# Patient Record
Sex: Female | Born: 1960 | Race: White | Hispanic: No | Smoking: Former smoker
Health system: Southern US, Community
[De-identification: ages and names within clinical notes are randomized; demographics above are authoritative.]

## PROBLEM LIST (undated history)

## (undated) DIAGNOSIS — F419 Anxiety disorder, unspecified: Secondary | ICD-10-CM

## (undated) DIAGNOSIS — G629 Polyneuropathy, unspecified: Secondary | ICD-10-CM

## (undated) DIAGNOSIS — K5190 Ulcerative colitis, unspecified, without complications: Secondary | ICD-10-CM

## (undated) DIAGNOSIS — K469 Unspecified abdominal hernia without obstruction or gangrene: Secondary | ICD-10-CM

## (undated) DIAGNOSIS — R112 Nausea with vomiting, unspecified: Secondary | ICD-10-CM

## (undated) DIAGNOSIS — R110 Nausea: Secondary | ICD-10-CM

## (undated) DIAGNOSIS — D649 Anemia, unspecified: Secondary | ICD-10-CM

## (undated) DIAGNOSIS — K219 Gastro-esophageal reflux disease without esophagitis: Secondary | ICD-10-CM

## (undated) DIAGNOSIS — T8859XA Other complications of anesthesia, initial encounter: Secondary | ICD-10-CM

## (undated) DIAGNOSIS — N809 Endometriosis, unspecified: Secondary | ICD-10-CM

## (undated) DIAGNOSIS — R52 Pain, unspecified: Secondary | ICD-10-CM

## (undated) DIAGNOSIS — K259 Gastric ulcer, unspecified as acute or chronic, without hemorrhage or perforation: Secondary | ICD-10-CM

## (undated) DIAGNOSIS — M5020 Other cervical disc displacement, unspecified cervical region: Secondary | ICD-10-CM

## (undated) DIAGNOSIS — F329 Major depressive disorder, single episode, unspecified: Secondary | ICD-10-CM

## (undated) DIAGNOSIS — K56609 Unspecified intestinal obstruction, unspecified as to partial versus complete obstruction: Secondary | ICD-10-CM

## (undated) DIAGNOSIS — J40 Bronchitis, not specified as acute or chronic: Secondary | ICD-10-CM

## (undated) DIAGNOSIS — Z9889 Other specified postprocedural states: Secondary | ICD-10-CM

## (undated) DIAGNOSIS — T4145XA Adverse effect of unspecified anesthetic, initial encounter: Secondary | ICD-10-CM

## (undated) DIAGNOSIS — M1990 Unspecified osteoarthritis, unspecified site: Secondary | ICD-10-CM

## (undated) DIAGNOSIS — G43909 Migraine, unspecified, not intractable, without status migrainosus: Secondary | ICD-10-CM

## (undated) DIAGNOSIS — J45909 Unspecified asthma, uncomplicated: Secondary | ICD-10-CM

## (undated) DIAGNOSIS — M797 Fibromyalgia: Secondary | ICD-10-CM

## (undated) DIAGNOSIS — E785 Hyperlipidemia, unspecified: Secondary | ICD-10-CM

## (undated) DIAGNOSIS — E119 Type 2 diabetes mellitus without complications: Secondary | ICD-10-CM

## (undated) DIAGNOSIS — K5090 Crohn's disease, unspecified, without complications: Secondary | ICD-10-CM

## (undated) DIAGNOSIS — F32A Depression, unspecified: Secondary | ICD-10-CM

## (undated) HISTORY — DX: Fibromyalgia: M79.7

## (undated) HISTORY — DX: Migraine, unspecified, not intractable, without status migrainosus: G43.909

## (undated) HISTORY — PX: CHOLECYSTECTOMY: SHX55

## (undated) HISTORY — DX: Ulcerative colitis, unspecified, without complications: K51.90

## (undated) HISTORY — PX: OTHER SURGICAL HISTORY: SHX169

## (undated) HISTORY — PX: ABDOMINAL HYSTERECTOMY: SHX81

## (undated) HISTORY — PX: ILEOSTOMY: SHX1783

## (undated) HISTORY — DX: Major depressive disorder, single episode, unspecified: F32.9

## (undated) HISTORY — DX: Hyperlipidemia, unspecified: E78.5

## (undated) HISTORY — DX: Type 2 diabetes mellitus without complications: E11.9

## (undated) HISTORY — DX: Depression, unspecified: F32.A

## (undated) HISTORY — DX: Anxiety disorder, unspecified: F41.9

## (undated) HISTORY — DX: Gastro-esophageal reflux disease without esophagitis: K21.9

## (undated) HISTORY — PX: HERNIA REPAIR: SHX51

## (undated) HISTORY — DX: Anemia, unspecified: D64.9

## (undated) HISTORY — PX: COLON RESECTION: SHX5231

## (undated) HISTORY — DX: Unspecified osteoarthritis, unspecified site: M19.90

## (undated) HISTORY — DX: Unspecified asthma, uncomplicated: J45.909

---

## 2004-07-16 ENCOUNTER — Ambulatory Visit: Payer: Self-pay

## 2004-12-02 ENCOUNTER — Emergency Department: Payer: Self-pay | Admitting: Internal Medicine

## 2004-12-03 ENCOUNTER — Inpatient Hospital Stay: Payer: Self-pay | Admitting: Surgery

## 2004-12-26 ENCOUNTER — Ambulatory Visit: Payer: Self-pay | Admitting: Emergency Medicine

## 2005-01-19 ENCOUNTER — Ambulatory Visit: Payer: Self-pay | Admitting: Emergency Medicine

## 2005-05-15 ENCOUNTER — Inpatient Hospital Stay: Payer: Self-pay | Admitting: Surgery

## 2005-05-16 ENCOUNTER — Other Ambulatory Visit: Payer: Self-pay

## 2005-06-01 ENCOUNTER — Emergency Department: Payer: Self-pay | Admitting: Internal Medicine

## 2005-09-01 ENCOUNTER — Ambulatory Visit: Payer: Self-pay

## 2006-07-16 ENCOUNTER — Ambulatory Visit: Payer: Self-pay

## 2006-09-30 ENCOUNTER — Ambulatory Visit: Payer: Self-pay

## 2006-10-08 ENCOUNTER — Ambulatory Visit: Payer: Self-pay | Admitting: Rheumatology

## 2006-10-20 ENCOUNTER — Ambulatory Visit: Payer: Self-pay | Admitting: Internal Medicine

## 2006-11-06 ENCOUNTER — Ambulatory Visit: Payer: Self-pay | Admitting: Internal Medicine

## 2006-11-20 ENCOUNTER — Ambulatory Visit: Payer: Self-pay | Admitting: Internal Medicine

## 2006-12-21 ENCOUNTER — Ambulatory Visit: Payer: Self-pay | Admitting: Internal Medicine

## 2007-01-20 ENCOUNTER — Ambulatory Visit: Payer: Self-pay | Admitting: Internal Medicine

## 2007-04-01 ENCOUNTER — Ambulatory Visit: Payer: Self-pay | Admitting: Specialist

## 2007-04-02 ENCOUNTER — Ambulatory Visit: Payer: Self-pay | Admitting: Surgery

## 2007-04-28 ENCOUNTER — Emergency Department: Payer: Self-pay

## 2007-06-29 ENCOUNTER — Ambulatory Visit: Payer: Self-pay

## 2007-07-15 ENCOUNTER — Ambulatory Visit: Payer: Self-pay | Admitting: Pain Medicine

## 2007-10-29 ENCOUNTER — Ambulatory Visit: Payer: Self-pay | Admitting: Family Medicine

## 2008-03-01 ENCOUNTER — Emergency Department: Payer: Self-pay | Admitting: Emergency Medicine

## 2008-03-21 ENCOUNTER — Ambulatory Visit: Payer: Self-pay | Admitting: Surgery

## 2008-05-08 ENCOUNTER — Ambulatory Visit (HOSPITAL_COMMUNITY): Admission: RE | Admit: 2008-05-08 | Discharge: 2008-05-08 | Payer: Self-pay | Admitting: Gastroenterology

## 2008-06-06 ENCOUNTER — Emergency Department: Payer: Self-pay | Admitting: Emergency Medicine

## 2008-07-14 ENCOUNTER — Emergency Department: Payer: Self-pay | Admitting: Emergency Medicine

## 2009-01-02 ENCOUNTER — Ambulatory Visit: Payer: Self-pay | Admitting: Family Medicine

## 2009-09-06 ENCOUNTER — Ambulatory Visit: Payer: Self-pay | Admitting: Family Medicine

## 2009-09-28 ENCOUNTER — Ambulatory Visit: Payer: Self-pay | Admitting: Family Medicine

## 2010-02-27 ENCOUNTER — Other Ambulatory Visit: Payer: Self-pay | Admitting: Physician Assistant

## 2010-03-11 ENCOUNTER — Ambulatory Visit: Payer: Self-pay | Admitting: Gastroenterology

## 2010-03-12 ENCOUNTER — Ambulatory Visit: Payer: Self-pay | Admitting: Gastroenterology

## 2010-03-18 ENCOUNTER — Ambulatory Visit: Payer: Self-pay | Admitting: Gastroenterology

## 2010-08-15 ENCOUNTER — Emergency Department: Payer: Self-pay | Admitting: Emergency Medicine

## 2010-09-10 ENCOUNTER — Ambulatory Visit: Payer: Self-pay | Admitting: Emergency Medicine

## 2010-09-16 LAB — PATHOLOGY REPORT

## 2010-11-07 ENCOUNTER — Ambulatory Visit: Payer: Self-pay | Admitting: Rheumatology

## 2010-11-20 ENCOUNTER — Ambulatory Visit: Payer: Self-pay | Admitting: Rheumatology

## 2011-02-18 ENCOUNTER — Ambulatory Visit: Payer: Self-pay | Admitting: Gastroenterology

## 2011-04-30 ENCOUNTER — Ambulatory Visit: Payer: Self-pay | Admitting: Family Medicine

## 2011-05-11 ENCOUNTER — Emergency Department: Payer: Self-pay | Admitting: Emergency Medicine

## 2011-08-11 ENCOUNTER — Ambulatory Visit: Payer: Self-pay | Admitting: Rheumatology

## 2011-12-17 ENCOUNTER — Ambulatory Visit: Payer: Self-pay | Admitting: Urology

## 2012-02-25 ENCOUNTER — Other Ambulatory Visit: Payer: Self-pay | Admitting: Physician Assistant

## 2012-05-03 DIAGNOSIS — N3020 Other chronic cystitis without hematuria: Secondary | ICD-10-CM | POA: Insufficient documentation

## 2012-05-03 DIAGNOSIS — N312 Flaccid neuropathic bladder, not elsewhere classified: Secondary | ICD-10-CM | POA: Insufficient documentation

## 2012-05-03 DIAGNOSIS — N9489 Other specified conditions associated with female genital organs and menstrual cycle: Secondary | ICD-10-CM | POA: Insufficient documentation

## 2012-06-28 ENCOUNTER — Ambulatory Visit: Payer: Self-pay | Admitting: Family Medicine

## 2012-08-17 DIAGNOSIS — R197 Diarrhea, unspecified: Secondary | ICD-10-CM | POA: Insufficient documentation

## 2012-08-22 ENCOUNTER — Emergency Department: Payer: Self-pay | Admitting: Emergency Medicine

## 2012-09-05 ENCOUNTER — Emergency Department: Payer: Self-pay | Admitting: Emergency Medicine

## 2012-09-24 DIAGNOSIS — J449 Chronic obstructive pulmonary disease, unspecified: Secondary | ICD-10-CM | POA: Insufficient documentation

## 2012-09-24 DIAGNOSIS — F419 Anxiety disorder, unspecified: Secondary | ICD-10-CM | POA: Insufficient documentation

## 2012-09-24 DIAGNOSIS — F329 Major depressive disorder, single episode, unspecified: Secondary | ICD-10-CM | POA: Insufficient documentation

## 2012-12-13 ENCOUNTER — Other Ambulatory Visit: Payer: Self-pay | Admitting: Unknown Physician Specialty

## 2012-12-18 ENCOUNTER — Other Ambulatory Visit: Payer: Self-pay | Admitting: Gastroenterology

## 2013-01-07 ENCOUNTER — Ambulatory Visit: Payer: Self-pay | Admitting: Physician Assistant

## 2013-01-19 ENCOUNTER — Ambulatory Visit: Payer: Self-pay | Admitting: Physician Assistant

## 2013-01-19 DIAGNOSIS — R3989 Other symptoms and signs involving the genitourinary system: Secondary | ICD-10-CM | POA: Insufficient documentation

## 2013-01-19 DIAGNOSIS — R339 Retention of urine, unspecified: Secondary | ICD-10-CM | POA: Insufficient documentation

## 2013-01-19 DIAGNOSIS — M6289 Other specified disorders of muscle: Secondary | ICD-10-CM | POA: Insufficient documentation

## 2013-02-02 ENCOUNTER — Ambulatory Visit: Payer: Self-pay | Admitting: Gastroenterology

## 2013-07-09 ENCOUNTER — Emergency Department: Payer: Self-pay | Admitting: Emergency Medicine

## 2013-07-09 LAB — COMPREHENSIVE METABOLIC PANEL
ALBUMIN: 3.7 g/dL (ref 3.4–5.0)
AST: 18 U/L (ref 15–37)
Alkaline Phosphatase: 102 U/L
Anion Gap: 4 — ABNORMAL LOW (ref 7–16)
BUN: 10 mg/dL (ref 7–18)
Bilirubin,Total: 0.2 mg/dL (ref 0.2–1.0)
CHLORIDE: 99 mmol/L (ref 98–107)
CO2: 29 mmol/L (ref 21–32)
CREATININE: 0.93 mg/dL (ref 0.60–1.30)
Calcium, Total: 8.6 mg/dL (ref 8.5–10.1)
Glucose: 338 mg/dL — ABNORMAL HIGH (ref 65–99)
OSMOLALITY: 277 (ref 275–301)
POTASSIUM: 3.9 mmol/L (ref 3.5–5.1)
SGPT (ALT): 22 U/L (ref 12–78)
Sodium: 132 mmol/L — ABNORMAL LOW (ref 136–145)
TOTAL PROTEIN: 7.2 g/dL (ref 6.4–8.2)

## 2013-07-09 LAB — URINALYSIS, COMPLETE
BACTERIA: NONE SEEN
BILIRUBIN, UR: NEGATIVE
BLOOD: NEGATIVE
Glucose,UR: >=500 mg/dL (ref 0–75)
Ketone: NEGATIVE
Leukocyte Esterase: NEGATIVE
NITRITE: NEGATIVE
PH: 7 (ref 4.5–8.0)
PROTEIN: NEGATIVE
Specific Gravity: 1.009 (ref 1.003–1.030)
Squamous Epithelial: NONE SEEN

## 2013-07-09 LAB — TROPONIN I

## 2013-07-09 LAB — CBC WITH DIFFERENTIAL/PLATELET
BASOS ABS: 0.1 10*3/uL (ref 0.0–0.1)
Basophil %: 0.8 %
EOS PCT: 1.7 %
Eosinophil #: 0.1 10*3/uL (ref 0.0–0.7)
HCT: 40.1 % (ref 35.0–47.0)
HGB: 13.1 g/dL (ref 12.0–16.0)
LYMPHS ABS: 1.5 10*3/uL (ref 1.0–3.6)
LYMPHS PCT: 18.3 %
MCH: 31.6 pg (ref 26.0–34.0)
MCHC: 32.7 g/dL (ref 32.0–36.0)
MCV: 96 fL (ref 80–100)
MONO ABS: 0.4 x10 3/mm (ref 0.2–0.9)
Monocyte %: 4.9 %
NEUTROS ABS: 6.3 10*3/uL (ref 1.4–6.5)
Neutrophil %: 74.3 %
Platelet: 348 10*3/uL (ref 150–440)
RBC: 4.16 10*6/uL (ref 3.80–5.20)
RDW: 16.9 % — AB (ref 11.5–14.5)
WBC: 8.4 10*3/uL (ref 3.6–11.0)

## 2013-07-09 LAB — PROTIME-INR
INR: 0.8
PROTHROMBIN TIME: 11 s — AB (ref 11.5–14.7)

## 2013-07-09 LAB — CK TOTAL AND CKMB (NOT AT ARMC)
CK, TOTAL: 98 U/L
CK-MB: 2.1 ng/mL (ref 0.5–3.6)

## 2013-07-28 DIAGNOSIS — Z531 Procedure and treatment not carried out because of patient's decision for reasons of belief and group pressure: Secondary | ICD-10-CM | POA: Insufficient documentation

## 2013-07-28 DIAGNOSIS — IMO0001 Reserved for inherently not codable concepts without codable children: Secondary | ICD-10-CM | POA: Insufficient documentation

## 2013-07-28 DIAGNOSIS — Z789 Other specified health status: Secondary | ICD-10-CM | POA: Insufficient documentation

## 2013-11-02 ENCOUNTER — Ambulatory Visit: Payer: Self-pay | Admitting: Pain Medicine

## 2013-11-07 ENCOUNTER — Other Ambulatory Visit: Payer: Self-pay | Admitting: Pain Medicine

## 2013-11-07 LAB — BASIC METABOLIC PANEL
ANION GAP: 8 (ref 7–16)
BUN: 13 mg/dL (ref 7–18)
CALCIUM: 9 mg/dL (ref 8.5–10.1)
CHLORIDE: 96 mmol/L — AB (ref 98–107)
Co2: 27 mmol/L (ref 21–32)
Creatinine: 0.92 mg/dL (ref 0.60–1.30)
GLUCOSE: 178 mg/dL — AB (ref 65–99)
Osmolality: 267 (ref 275–301)
Potassium: 4.2 mmol/L (ref 3.5–5.1)
Sodium: 131 mmol/L — ABNORMAL LOW (ref 136–145)

## 2013-11-07 LAB — MAGNESIUM: Magnesium: 1.8 mg/dL

## 2013-11-07 LAB — HEPATIC FUNCTION PANEL A (ARMC)
Albumin: 3.9 g/dL (ref 3.4–5.0)
Alkaline Phosphatase: 71 U/L
Bilirubin, Direct: <0.1 mg/dL (ref 0.00–0.20)
Bilirubin,Total: 0.2 mg/dL (ref 0.2–1.0)
SGOT(AST): 23 U/L (ref 15–37)
SGPT (ALT): 20 U/L (ref 12–78)
Total Protein: 7.8 g/dL (ref 6.4–8.2)

## 2013-11-07 LAB — SEDIMENTATION RATE: ERYTHROCYTE SED RATE: 14 mm/h (ref 0–30)

## 2013-11-07 LAB — HEMOGLOBIN A1C: Hemoglobin A1C: 8 % — ABNORMAL HIGH (ref 4.2–6.3)

## 2013-11-19 ENCOUNTER — Ambulatory Visit: Payer: Self-pay | Admitting: Pain Medicine

## 2013-11-21 ENCOUNTER — Ambulatory Visit: Payer: Self-pay | Admitting: Pain Medicine

## 2013-12-15 ENCOUNTER — Ambulatory Visit: Payer: Self-pay | Admitting: Specialist

## 2013-12-22 ENCOUNTER — Ambulatory Visit: Payer: Self-pay | Admitting: Gastroenterology

## 2013-12-22 DIAGNOSIS — K5090 Crohn's disease, unspecified, without complications: Secondary | ICD-10-CM | POA: Diagnosis present

## 2014-01-16 ENCOUNTER — Emergency Department: Payer: Self-pay | Admitting: Emergency Medicine

## 2014-01-16 LAB — CBC WITH DIFFERENTIAL/PLATELET
BASOS ABS: 0.1 10*3/uL (ref 0.0–0.1)
Basophil %: 1.3 %
EOS ABS: 0.6 10*3/uL (ref 0.0–0.7)
Eosinophil %: 9.7 %
HCT: 26.8 % — AB (ref 35.0–47.0)
HGB: 8.1 g/dL — AB (ref 12.0–16.0)
LYMPHS PCT: 12.5 %
Lymphocyte #: 0.8 10*3/uL — ABNORMAL LOW (ref 1.0–3.6)
MCH: 25.1 pg — ABNORMAL LOW (ref 26.0–34.0)
MCHC: 30.3 g/dL — ABNORMAL LOW (ref 32.0–36.0)
MCV: 83 fL (ref 80–100)
MONO ABS: 0.3 x10 3/mm (ref 0.2–0.9)
MONOS PCT: 4.7 %
NEUTROS PCT: 71.8 %
Neutrophil #: 4.6 10*3/uL (ref 1.4–6.5)
PLATELETS: 469 10*3/uL — AB (ref 150–440)
RBC: 3.24 10*6/uL — ABNORMAL LOW (ref 3.80–5.20)
RDW: 14.6 % — ABNORMAL HIGH (ref 11.5–14.5)
WBC: 6.4 10*3/uL (ref 3.6–11.0)

## 2014-01-16 LAB — URINALYSIS, COMPLETE
BACTERIA: NONE SEEN
BLOOD: NEGATIVE
Bilirubin,UR: NEGATIVE
Glucose,UR: 50 mg/dL (ref 0–75)
Ketone: NEGATIVE
Leukocyte Esterase: NEGATIVE
Nitrite: NEGATIVE
PH: 6 (ref 4.5–8.0)
PROTEIN: NEGATIVE
RBC, UR: NONE SEEN /HPF (ref 0–5)
SPECIFIC GRAVITY: 1.003 (ref 1.003–1.030)
Squamous Epithelial: NONE SEEN
WBC UR: 1 /HPF (ref 0–5)

## 2014-01-16 LAB — COMPREHENSIVE METABOLIC PANEL
ALT: 17 U/L
ANION GAP: 7 (ref 7–16)
Albumin: 2.5 g/dL — ABNORMAL LOW (ref 3.4–5.0)
Alkaline Phosphatase: 62 U/L
BILIRUBIN TOTAL: 0.1 mg/dL — AB (ref 0.2–1.0)
BUN: 10 mg/dL (ref 7–18)
CO2: 30 mmol/L (ref 21–32)
Calcium, Total: 8.2 mg/dL — ABNORMAL LOW (ref 8.5–10.1)
Chloride: 100 mmol/L (ref 98–107)
Creatinine: 0.83 mg/dL (ref 0.60–1.30)
Glucose: 201 mg/dL — ABNORMAL HIGH (ref 65–99)
OSMOLALITY: 279 (ref 275–301)
Potassium: 3.9 mmol/L (ref 3.5–5.1)
SGOT(AST): 18 U/L (ref 15–37)
SODIUM: 137 mmol/L (ref 136–145)
TOTAL PROTEIN: 5.8 g/dL — AB (ref 6.4–8.2)

## 2014-01-16 LAB — TROPONIN I

## 2014-01-16 LAB — LIPASE, BLOOD: Lipase: 489 U/L — ABNORMAL HIGH (ref 73–393)

## 2014-01-23 ENCOUNTER — Ambulatory Visit: Payer: Self-pay | Admitting: Gastroenterology

## 2014-01-25 LAB — PATHOLOGY REPORT

## 2014-03-13 ENCOUNTER — Ambulatory Visit: Payer: Self-pay | Admitting: Hematology and Oncology

## 2014-03-21 ENCOUNTER — Ambulatory Visit: Payer: Self-pay | Admitting: Hematology and Oncology

## 2014-03-21 LAB — CBC CANCER CENTER
BANDS NEUTROPHIL: 2 %
BASOS ABS: 0.1 x10 3/mm (ref 0.0–0.1)
Basophil %: 1.4 %
Basophil: 3 %
EOS PCT: 1 %
Eosinophil #: 0.3 x10 3/mm (ref 0.0–0.7)
Eosinophil %: 4.3 %
HCT: 35.1 % (ref 35.0–47.0)
HGB: 10.9 g/dL — AB (ref 12.0–16.0)
LYMPHS ABS: 1.1 x10 3/mm (ref 1.0–3.6)
LYMPHS PCT: 14 %
Lymphocyte %: 14.6 %
MCH: 26.1 pg (ref 26.0–34.0)
MCHC: 30.9 g/dL — AB (ref 32.0–36.0)
MCV: 85 fL (ref 80–100)
MONO ABS: 0.4 x10 3/mm (ref 0.2–0.9)
MONOS PCT: 4.6 %
Monocytes: 7 %
NEUTROS PCT: 75.1 %
Neutrophil #: 5.8 x10 3/mm (ref 1.4–6.5)
Platelet: 632 x10 3/mm — ABNORMAL HIGH (ref 150–440)
RBC: 4.16 10*6/uL (ref 3.80–5.20)
RDW: 19.4 % — AB (ref 11.5–14.5)
Segmented Neutrophils: 71 %
VARIANT LYMPHOCYTE - H4-RLYMPH: 2 %
WBC: 7.8 x10 3/mm (ref 3.6–11.0)

## 2014-03-21 LAB — IRON AND TIBC
Iron Bind.Cap.(Total): 454 ug/dL — ABNORMAL HIGH (ref 250–450)
Iron Saturation: 7 %
Iron: 32 ug/dL — ABNORMAL LOW (ref 50–170)
Unbound Iron-Bind.Cap.: 422 ug/dL

## 2014-03-21 LAB — FERRITIN: FERRITIN (ARMC): 8 ng/mL (ref 8–388)

## 2014-03-21 LAB — RETICULOCYTES
ABSOLUTE RETIC COUNT: 0.1024 10*6/uL (ref 0.019–0.186)
RETICULOCYTE: 2.46 % (ref 0.4–3.1)

## 2014-03-21 LAB — FOLATE: FOLIC ACID: 6.4 ng/mL (ref 3.1–100.0)

## 2014-03-21 LAB — SEDIMENTATION RATE: ERYTHROCYTE SED RATE: 35 mm/h — AB (ref 0–30)

## 2014-03-21 LAB — LACTATE DEHYDROGENASE: LDH: 153 U/L (ref 81–246)

## 2014-03-23 LAB — URINE IEP, RANDOM

## 2014-03-23 LAB — PROT IMMUNOELECTROPHORES(ARMC)

## 2014-03-28 LAB — OCCULT BLOOD X 1 CARD TO LAB, STOOL
Occult Blood, Feces: POSITIVE
Occult Blood, Feces: NEGATIVE

## 2014-03-29 DIAGNOSIS — J4530 Mild persistent asthma, uncomplicated: Secondary | ICD-10-CM | POA: Insufficient documentation

## 2014-04-12 LAB — CBC CANCER CENTER
BASOS ABS: 0.1 x10 3/mm (ref 0.0–0.1)
Basophil %: 0.9 %
EOS ABS: 0.4 x10 3/mm (ref 0.0–0.7)
EOS PCT: 5.2 %
HCT: 33.8 % — AB (ref 35.0–47.0)
HGB: 10.6 g/dL — ABNORMAL LOW (ref 12.0–16.0)
LYMPHS ABS: 1 x10 3/mm (ref 1.0–3.6)
LYMPHS PCT: 14.4 %
MCH: 26.7 pg (ref 26.0–34.0)
MCHC: 31.2 g/dL — ABNORMAL LOW (ref 32.0–36.0)
MCV: 86 fL (ref 80–100)
Monocyte #: 0.3 x10 3/mm (ref 0.2–0.9)
Monocyte %: 4.6 %
NEUTROS PCT: 74.9 %
Neutrophil #: 5.1 x10 3/mm (ref 1.4–6.5)
PLATELETS: 419 x10 3/mm (ref 150–440)
RBC: 3.96 10*6/uL (ref 3.80–5.20)
RDW: 21.4 % — ABNORMAL HIGH (ref 11.5–14.5)
WBC: 6.9 x10 3/mm (ref 3.6–11.0)

## 2014-04-17 DIAGNOSIS — G629 Polyneuropathy, unspecified: Secondary | ICD-10-CM | POA: Insufficient documentation

## 2014-04-17 DIAGNOSIS — Z794 Long term (current) use of insulin: Secondary | ICD-10-CM | POA: Insufficient documentation

## 2014-04-21 ENCOUNTER — Ambulatory Visit: Payer: Self-pay | Admitting: Hematology and Oncology

## 2014-05-02 LAB — CBC CANCER CENTER
BASOS ABS: 0.1 x10 3/mm (ref 0.0–0.1)
Basophil %: 0.9 %
Eosinophil #: 0.3 x10 3/mm (ref 0.0–0.7)
Eosinophil %: 4.7 %
HCT: 38.4 % (ref 35.0–47.0)
HGB: 12.2 g/dL (ref 12.0–16.0)
LYMPHS ABS: 1.1 x10 3/mm (ref 1.0–3.6)
Lymphocyte %: 16.7 %
MCH: 28.2 pg (ref 26.0–34.0)
MCHC: 31.8 g/dL — ABNORMAL LOW (ref 32.0–36.0)
MCV: 89 fL (ref 80–100)
MONOS PCT: 5.9 %
Monocyte #: 0.4 x10 3/mm (ref 0.2–0.9)
Neutrophil #: 4.9 x10 3/mm (ref 1.4–6.5)
Neutrophil %: 71.8 %
PLATELETS: 385 x10 3/mm (ref 150–440)
RBC: 4.33 10*6/uL (ref 3.80–5.20)
RDW: 21.9 % — AB (ref 11.5–14.5)
WBC: 6.9 x10 3/mm (ref 3.6–11.0)

## 2014-05-02 LAB — IRON AND TIBC
IRON SATURATION: 20 %
IRON: 72 ug/dL (ref 50–170)
Iron Bind.Cap.(Total): 366 ug/dL (ref 250–450)
UNBOUND IRON-BIND. CAP.: 294 ug/dL

## 2014-05-02 LAB — FERRITIN: Ferritin (ARMC): 118 ng/mL (ref 8–388)

## 2014-05-22 ENCOUNTER — Ambulatory Visit: Payer: Self-pay | Admitting: Hematology and Oncology

## 2014-06-28 DIAGNOSIS — E1165 Type 2 diabetes mellitus with hyperglycemia: Secondary | ICD-10-CM | POA: Insufficient documentation

## 2014-06-28 DIAGNOSIS — Z932 Ileostomy status: Secondary | ICD-10-CM | POA: Insufficient documentation

## 2014-06-28 DIAGNOSIS — IMO0002 Reserved for concepts with insufficient information to code with codable children: Secondary | ICD-10-CM | POA: Insufficient documentation

## 2014-06-28 DIAGNOSIS — G43919 Migraine, unspecified, intractable, without status migrainosus: Secondary | ICD-10-CM | POA: Insufficient documentation

## 2014-07-10 ENCOUNTER — Ambulatory Visit
Admit: 2014-07-10 | Disposition: A | Discharge status: Home / Self Care | Payer: Self-pay | Attending: Hematology and Oncology | Admitting: Hematology and Oncology

## 2014-07-13 LAB — CBC CANCER CENTER
Basophil #: 0.1 x10 3/mm (ref 0.0–0.1)
Basophil %: 1.7 %
Eosinophil #: 0.9 x10 3/mm — ABNORMAL HIGH (ref 0.0–0.7)
Eosinophil %: 11.3 %
HCT: 30 % — ABNORMAL LOW (ref 35.0–47.0)
HGB: 9.3 g/dL — ABNORMAL LOW (ref 12.0–16.0)
Lymphocyte #: 1.2 x10 3/mm (ref 1.0–3.6)
Lymphocyte %: 16.2 %
MCH: 25.7 pg — ABNORMAL LOW (ref 26.0–34.0)
MCHC: 31.1 g/dL — ABNORMAL LOW (ref 32.0–36.0)
MCV: 82 fL (ref 80–100)
Monocyte #: 0.4 x10 3/mm (ref 0.2–0.9)
Monocyte %: 5.8 %
Neutrophil #: 4.9 x10 3/mm (ref 1.4–6.5)
Neutrophil %: 65 %
Platelet: 537 x10 3/mm — ABNORMAL HIGH (ref 150–440)
RBC: 3.64 10*6/uL — ABNORMAL LOW (ref 3.80–5.20)
RDW: 17.3 % — ABNORMAL HIGH (ref 11.5–14.5)
WBC: 7.6 x10 3/mm (ref 3.6–11.0)

## 2014-07-21 ENCOUNTER — Ambulatory Visit
Admit: 2014-07-21 | Disposition: A | Discharge status: Home / Self Care | Payer: Self-pay | Attending: Hematology and Oncology | Admitting: Hematology and Oncology

## 2014-07-27 DIAGNOSIS — G5701 Lesion of sciatic nerve, right lower limb: Secondary | ICD-10-CM | POA: Insufficient documentation

## 2014-07-27 DIAGNOSIS — M533 Sacrococcygeal disorders, not elsewhere classified: Secondary | ICD-10-CM | POA: Insufficient documentation

## 2014-07-27 DIAGNOSIS — S86891A Other injury of other muscle(s) and tendon(s) at lower leg level, right leg, initial encounter: Secondary | ICD-10-CM | POA: Insufficient documentation

## 2014-08-10 DIAGNOSIS — E785 Hyperlipidemia, unspecified: Secondary | ICD-10-CM | POA: Insufficient documentation

## 2014-08-11 LAB — CREATININE, SERUM: Creatine, Serum: 0.83

## 2014-10-13 ENCOUNTER — Other Ambulatory Visit: Payer: Self-pay

## 2014-10-13 ENCOUNTER — Ambulatory Visit: Payer: Self-pay | Admitting: Hematology and Oncology

## 2014-10-31 ENCOUNTER — Other Ambulatory Visit: Payer: Self-pay

## 2014-10-31 ENCOUNTER — Ambulatory Visit: Payer: Self-pay | Admitting: Hematology and Oncology

## 2014-11-18 ENCOUNTER — Emergency Department: Payer: Medicare Other

## 2014-11-18 ENCOUNTER — Emergency Department
Admission: EM | Admit: 2014-11-18 | Discharge: 2014-11-19 | Disposition: A | Discharge status: Home / Self Care | Payer: Medicare Other | Attending: Emergency Medicine | Admitting: Emergency Medicine

## 2014-11-18 ENCOUNTER — Encounter: Payer: Self-pay | Admitting: Emergency Medicine

## 2014-11-18 DIAGNOSIS — Z79899 Other long term (current) drug therapy: Secondary | ICD-10-CM | POA: Diagnosis not present

## 2014-11-18 DIAGNOSIS — E119 Type 2 diabetes mellitus without complications: Secondary | ICD-10-CM | POA: Diagnosis not present

## 2014-11-18 DIAGNOSIS — R109 Unspecified abdominal pain: Secondary | ICD-10-CM

## 2014-11-18 DIAGNOSIS — Z7951 Long term (current) use of inhaled steroids: Secondary | ICD-10-CM | POA: Diagnosis not present

## 2014-11-18 DIAGNOSIS — R1084 Generalized abdominal pain: Secondary | ICD-10-CM | POA: Diagnosis not present

## 2014-11-18 DIAGNOSIS — Z8719 Personal history of other diseases of the digestive system: Secondary | ICD-10-CM | POA: Insufficient documentation

## 2014-11-18 DIAGNOSIS — R000 Tachycardia, unspecified: Secondary | ICD-10-CM | POA: Insufficient documentation

## 2014-11-18 DIAGNOSIS — Z794 Long term (current) use of insulin: Secondary | ICD-10-CM | POA: Insufficient documentation

## 2014-11-18 DIAGNOSIS — Z880 Allergy status to penicillin: Secondary | ICD-10-CM | POA: Diagnosis not present

## 2014-11-18 HISTORY — DX: Endometriosis, unspecified: N80.9

## 2014-11-18 HISTORY — DX: Unspecified abdominal hernia without obstruction or gangrene: K46.9

## 2014-11-18 HISTORY — DX: Other cervical disc displacement, unspecified cervical region: M50.20

## 2014-11-18 HISTORY — DX: Crohn's disease, unspecified, without complications: K50.90

## 2014-11-18 HISTORY — DX: Gastric ulcer, unspecified as acute or chronic, without hemorrhage or perforation: K25.9

## 2014-11-18 HISTORY — DX: Unspecified intestinal obstruction, unspecified as to partial versus complete obstruction: K56.609

## 2014-11-18 LAB — CBC WITH DIFFERENTIAL/PLATELET
BASOS ABS: 0.1 10*3/uL (ref 0–0.1)
Basophils Relative: 1 %
Eosinophils Absolute: 0.7 10*3/uL (ref 0–0.7)
Eosinophils Relative: 6 %
HCT: 37.6 % (ref 35.0–47.0)
Hemoglobin: 12 g/dL (ref 12.0–16.0)
LYMPHS PCT: 21 %
Lymphs Abs: 2.5 10*3/uL (ref 1.0–3.6)
MCH: 26.4 pg (ref 26.0–34.0)
MCHC: 31.9 g/dL — ABNORMAL LOW (ref 32.0–36.0)
MCV: 82.8 fL (ref 80.0–100.0)
Monocytes Absolute: 0.8 10*3/uL (ref 0.2–0.9)
Monocytes Relative: 7 %
NEUTROS ABS: 8.1 10*3/uL — AB (ref 1.4–6.5)
NEUTROS PCT: 67 %
PLATELETS: 733 10*3/uL — AB (ref 150–440)
RBC: 4.54 MIL/uL (ref 3.80–5.20)
RDW: 15.6 % — AB (ref 11.5–14.5)
WBC: 12.2 10*3/uL — AB (ref 3.6–11.0)

## 2014-11-18 LAB — COMPREHENSIVE METABOLIC PANEL
ALT: 12 U/L — AB (ref 14–54)
AST: 15 U/L (ref 15–41)
Albumin: 3.7 g/dL (ref 3.5–5.0)
Alkaline Phosphatase: 100 U/L (ref 38–126)
Anion gap: 9 (ref 5–15)
BILIRUBIN TOTAL: 0.3 mg/dL (ref 0.3–1.2)
BUN: 21 mg/dL — ABNORMAL HIGH (ref 6–20)
CALCIUM: 9.2 mg/dL (ref 8.9–10.3)
CHLORIDE: 98 mmol/L — AB (ref 101–111)
CO2: 29 mmol/L (ref 22–32)
Creatinine, Ser: 1.03 mg/dL — ABNORMAL HIGH (ref 0.44–1.00)
GLUCOSE: 173 mg/dL — AB (ref 65–99)
Potassium: 4.4 mmol/L (ref 3.5–5.1)
SODIUM: 136 mmol/L (ref 135–145)
Total Protein: 7.3 g/dL (ref 6.5–8.1)

## 2014-11-18 LAB — LIPASE, BLOOD: Lipase: 39 U/L (ref 22–51)

## 2014-11-18 MED ORDER — HYDROMORPHONE HCL 1 MG/ML IJ SOLN
1.0000 mg | Freq: Once | INTRAMUSCULAR | Status: AC
  Administered 2014-11-18: 1 mg via INTRAVENOUS

## 2014-11-18 MED ORDER — ONDANSETRON HCL 4 MG/2ML IJ SOLN
INTRAMUSCULAR | Status: AC
  Administered 2014-11-18: 4 mg via INTRAVENOUS
  Filled 2014-11-18: qty 2

## 2014-11-18 MED ORDER — METOCLOPRAMIDE HCL 5 MG/ML IJ SOLN
10.0000 mg | Freq: Once | INTRAMUSCULAR | Status: AC
  Administered 2014-11-18: 10 mg via INTRAVENOUS
  Filled 2014-11-18: qty 2

## 2014-11-18 MED ORDER — HYDROMORPHONE HCL 1 MG/ML IJ SOLN
0.5000 mg | INTRAMUSCULAR | Status: DC | PRN
  Administered 2014-11-18: 0.5 mg via INTRAVENOUS
  Filled 2014-11-18: qty 1

## 2014-11-18 MED ORDER — HYDROMORPHONE HCL 1 MG/ML IJ SOLN
INTRAMUSCULAR | Status: AC
  Administered 2014-11-18: 1 mg via INTRAVENOUS
  Filled 2014-11-18: qty 1

## 2014-11-18 MED ORDER — IOHEXOL 300 MG/ML  SOLN
100.0000 mL | Freq: Once | INTRAMUSCULAR | Status: AC | PRN
  Administered 2014-11-18: 100 mL via INTRAVENOUS

## 2014-11-18 MED ORDER — ONDANSETRON HCL 4 MG/2ML IJ SOLN
4.0000 mg | Freq: Once | INTRAMUSCULAR | Status: AC
  Administered 2014-11-18: 4 mg via INTRAVENOUS

## 2014-11-18 MED ORDER — IOHEXOL 240 MG/ML SOLN
25.0000 mL | INTRAMUSCULAR | Status: AC
  Administered 2014-11-18 (×2): 25 mL via ORAL

## 2014-11-18 MED ORDER — IOHEXOL 240 MG/ML SOLN: 25.0000 mL | Freq: Once | INTRAMUSCULAR | Status: AC | PRN

## 2014-11-18 NOTE — ED Notes (Signed)
Patient transported to X-ray 

## 2014-11-18 NOTE — ED Provider Notes (Signed)
[facility] Emergency Department Provider Note  ____________________________________________  Time seen: 2050  I have reviewed the triage vital signs and the nursing notes.   HISTORY  Chief Complaint Abdominal Pain     HPI Caroline Hughes is a 54 y.o. female with a history of a total colectomy some time ago due to ulcerative colitis and Crohn's. The patient reports having abdominal pain over the past year that is not controlled well and not well understood. She has been seeing her gastroenterologist Dr. Rayann Heman and has an upper endoscopy pending at [facility] in approximately one month.  She presents to the emergency department tonight due to worsening pain today. She reports this feels as though she has a bowel obstruction. She does not have a great deal of output from her ileostomy, however she also explains that she has not been eating much.  She arrives in significant distress. I previously ordered Dilaudid for her. As I examine her now, this seems to help some and she reports her pain is moderate.   Past Medical History  Diagnosis Date  . Arthritis   . Fibromyalgia   . Ulcerative colitis   . GERD (gastroesophageal reflux disease)   . Migraine   . Hyperlipemia   . Depression   . Anxiety   . Diabetes mellitus without complication   . Asthma   . Anemia     Iron deficiency  . Crohn's disease   . Endometriosis   . Herniated disc, cervical   . Gastric ulcer   . Peristomal hernia   . Intestinal obstruction     There are no active problems to display for this patient.   Past Surgical History  Procedure Laterality Date  . Abdominal hysterectomy    . Rectal fistula packing    . Ileostomy    . Hernia repair      Current Outpatient Rx  Name  Route  Sig  Dispense  Refill  . albuterol (PROVENTIL HFA;VENTOLIN HFA) 108 (90 BASE) MCG/ACT inhaler   Inhalation   Inhale 2 puffs into the lungs every 6 (six) hours as needed for wheezing or shortness of  breath.         . Certolizumab Pegol (CIMZIA [place])   Subcutaneous   Inject 2 mLs into the skin. Once a month on the 17th         . cyclobenzaprine (FLEXERIL) 10 MG tablet   Oral   Take 10 mg by mouth daily as needed for muscle spasms.         . DULoxetine (CYMBALTA) 60 MG capsule   Oral   Take 60 mg by mouth daily.         . fluticasone-salmeterol (ADVAIR HFA) 230-21 MCG/ACT inhaler   Inhalation   Inhale 2 puffs into the lungs 2 (two) times daily.         . folic acid (FOLVITE) 323 MCG tablet   Oral   Take 400 mcg by mouth daily.         Marland Kitchen gabapentin (NEURONTIN) 100 MG capsule   Oral   Take 200 mg by mouth 3 (three) times daily.         . hyoscyamine (LEVSIN, ANASPAZ) 0.125 MG tablet   Oral   Take 0.125 mg by mouth every 4 (four) hours as needed.         . insulin glargine (LANTUS) 100 UNIT/ML injection   Subcutaneous   Inject 16 Units into the skin daily.          Marland Kitchen  Lactobacillus (ACIDOPHILUS PO)   Oral   Take 1 capsule by mouth daily.         . lipase/protease/amylase (CREON) 12000 UNITS CPEP capsule   Oral   Take 12,000 Units by mouth.         Marland Kitchen LORazepam (ATIVAN) 0.5 MG tablet   Oral   Take 0.5 mg by mouth at bedtime.         . ondansetron (ZOFRAN) 8 MG tablet   Oral   Take 8 mg by mouth every 4 (four) hours as needed for nausea or vomiting.         . pioglitazone (ACTOS) 15 MG tablet   Oral   Take 15 mg by mouth daily.         . SUMAtriptan (IMITREX) 50 MG tablet   Oral   Take 50 mg by mouth every 2 (two) hours as needed for migraine. May repeat in 2 hours if headache persists or recurs.         Marland Kitchen tiZANidine (ZANAFLEX) 4 MG tablet   Oral   Take 4 mg by mouth daily as needed for muscle spasms.         . traZODone (DESYREL) 50 MG tablet   Oral   Take 50 mg by mouth at bedtime.         Marland Kitchen ipratropium-albuterol (DUONEB) 0.5-2.5 (3) MG/3ML SOLN   Nebulization   Take 3 mLs by nebulization every 6 (six) hours as  needed.           Allergies Penicillins; Corn-containing products; Other; Shellfish allergy; Soy allergy; Sulfonylureas; Wheat bran; Humira; Statins; Sulfa antibiotics; and Vancomycin  History reviewed. No pertinent family history.  Social History History  Substance Use Topics  . Smoking status: Never Smoker   . Smokeless tobacco: Not on file  . Alcohol Use: No    Review of Systems  Constitutional: Negative for fever. ENT: Negative for sore throat. Cardiovascular: Negative for chest pain. Respiratory: Negative for shortness of breath. Gastrointestinal: Positive for notable abdominal pain. See history of present illness for her, located history.. Genitourinary: Negative for dysuria. Musculoskeletal: No myalgias or injuries. Skin: Negative for rash. Neurological: Negative for headaches   10-point ROS otherwise negative.  ____________________________________________   PHYSICAL EXAM:  VITAL SIGNS: ED Triage Vitals  Enc Vitals Group     BP 11/18/14 1955 139/85 mmHg     Pulse Rate 11/18/14 1955 124     Resp 11/18/14 1955 24     Temp --      Temp src --      SpO2 11/18/14 1955 95 %     Weight 11/18/14 1955 140 lb (63.504 kg)     Height 11/18/14 1955 5' 2"  (1.575 m)     Head Cir --      Peak Flow --      Pain Score 11/18/14 1956 10     Pain Loc --      Pain Edu? --      Excl. in [place]? --     Constitutional: Alert and oriented. Arrived in significant distress. Now, status post 1 mg of Dilaudid, the patient appears to be in mild-to-moderate distress. She is communicative. ENT   Head: Normocephalic and atraumatic.   Nose: No congestion/rhinnorhea. Cardiovascular: Normal rate, regular rhythm, no murmur noted Respiratory:  Normal respiratory effort, no tachypnea.    Breath sounds are clear and equal bilaterally.  Gastrointestinal: There is an ostomy bag on the right abdomen. Notable well-healed surgical scars  present. She has diffuse tenderness in all 4  quadrants, although less so in the right lower quadrant..  Back: No muscle spasm, no tenderness, no CVA tenderness. Musculoskeletal: No deformity noted. Nontender with normal range of motion in all extremities.  No noted edema. Neurologic:  Normal speech and language. No gross focal neurologic deficits are appreciated.  Skin:  Skin is warm, dry. No rash noted. Psychiatric: Mood and affect are normal. Speech and behavior are normal.  ____________________________________________    LABS (pertinent positives/negatives)  Labs Reviewed  CBC WITH DIFFERENTIAL/PLATELET - Abnormal; Notable for the following:    WBC 12.2 (*)    MCHC 31.9 (*)    RDW 15.6 (*)    Platelets 733 (*)    Neutro Abs 8.1 (*)    All other components within normal limits  COMPREHENSIVE METABOLIC PANEL - Abnormal; Notable for the following:    Chloride 98 (*)    Glucose, Bld 173 (*)    BUN 21 (*)    Creatinine, Ser 1.03 (*)    ALT 12 (*)    All other components within normal limits  LIPASE, BLOOD  URINALYSIS COMPLETEWITH MICROSCOPIC ([facility] ONLY)     ____________________________________________   EKG  ED ECG REPORT I, Lyla Jasek W, the attending physician, personally viewed and interpreted this ECG.   Date: 11/18/2014  EKG Time: 2005  Rate: 120  Rhythm: Sinus tachycardia  Axis: Normal  Intervals: Normal  ST&T Change: None noted   ____________________________________________    RADIOLOGY  Two-view abdomen: Unremarkable bowel gas pattern. No acute findings I, Francene Castle, personally viewed this film.  CT scan abdomen pelvis: Pending  ____________________________________________   INITIAL IMPRESSION / ASSESSMENT AND PLAN / ED COURSE  Pertinent labs & imaging results that were available during my care of the patient were reviewed by me and considered in my medical decision making (see chart for details).  55 year old female with a long history of abdominal issues due to ulcerative  colitis, Crohn's disease, and possibly other functional problems. She has had a colectomy and has an ileostomy. She does not have an obvious obstruction on the two-view abdominal film. She reports having abdominal pain frequently but worse today. I suspect that her pain today is an exacerbation of her underlying chronic condition. We spoke about the pros and cons of getting a CT scan on her abdomen. I am concerned that this is a complex patient that gets many CT scans. Despite this conversation and my concerns for total radiation dose, the patient requests a CT scan to try to determine the underlying cause for pain. CT scan has been ordered and is pending.  The patient did obtain some pain control after the initial 1 mg of Dilaudid. She continues to have some discomfort and we will treat her with another half milligram of Dilaudid and Reglan currently.  ----------------------------------------- 10:58 PM on 11/18/2014 -----------------------------------------  Reassessment finds the patient significantly more comfortable. She is sitting upright in bed drinking the oral contrast for the CT scan. At this time I will ask my colleague Dr. Joni Fears to follow-up on the patient and her CT scan results.  ____________________________________________   FINAL CLINICAL IMPRESSION(S) / ED DIAGNOSES  Final diagnoses:  Abdominal pain      Ahmed Prima, MD 11/18/14 2302

## 2014-11-18 NOTE — ED Notes (Signed)
Pt presents to ER alert and in NAD. Pt reports generalized abd pain states hx of intestinal blockages, has ostomy in place currently. Pt is grimacing in triage.

## 2014-11-18 NOTE — ED Notes (Signed)
Patient transported to CT 

## 2014-11-19 DIAGNOSIS — R1084 Generalized abdominal pain: Secondary | ICD-10-CM | POA: Diagnosis not present

## 2014-11-19 MED ORDER — METOCLOPRAMIDE HCL 10 MG PO TABS: 10.0000 mg | ORAL_TABLET | Freq: Three times a day (TID) | ORAL | Status: DC

## 2014-11-19 MED ORDER — OXYCODONE-ACETAMINOPHEN 5-325 MG PO TABS: 1.0000 | ORAL_TABLET | Freq: Four times a day (QID) | ORAL | Status: DC | PRN

## 2014-11-19 MED ORDER — OXYCODONE-ACETAMINOPHEN 5-325 MG PO TABS
2.0000 | ORAL_TABLET | Freq: Once | ORAL | Status: AC
Start: 2014-11-19 — End: 2014-11-19
  Administered 2014-11-19: 2 via ORAL
  Filled 2014-11-19: qty 2

## 2014-11-19 MED ORDER — DIPHENHYDRAMINE HCL 25 MG PO CAPS: 50.0000 mg | ORAL_CAPSULE | Freq: Four times a day (QID) | ORAL | Status: DC | PRN

## 2014-11-19 NOTE — ED Provider Notes (Signed)
Patient's symptoms are much improved. Well appearing no acute distress vital signs stable. CT negative. We'll discharge with antiemetics and analgesics and have her follow up with primary care this week.  Carrie Mew, MD 11/19/14 [phone]

## 2014-11-19 NOTE — Discharge Instructions (Signed)

## 2014-11-22 LAB — IRON AND TIBC
Iron Bind.Cap.(Total): 461 — ABNORMAL HIGH (ref 250–450)
Iron Saturation: 4
Iron: 19 ug/dL — ABNORMAL LOW
Unbound Iron-Bind.Cap.: 442

## 2014-11-22 LAB — FERRITIN: Ferritin (ARMC): 5 ng/mL — ABNORMAL LOW

## 2014-12-16 ENCOUNTER — Other Ambulatory Visit: Payer: Self-pay

## 2014-12-16 ENCOUNTER — Encounter: Payer: Self-pay | Admitting: Emergency Medicine

## 2014-12-16 ENCOUNTER — Emergency Department
Admission: EM | Admit: 2014-12-16 | Discharge: 2014-12-16 | Disposition: A | Discharge status: Home / Self Care | Payer: Medicare Other | Attending: Emergency Medicine | Admitting: Emergency Medicine

## 2014-12-16 DIAGNOSIS — R1115 Cyclical vomiting syndrome unrelated to migraine: Secondary | ICD-10-CM

## 2014-12-16 DIAGNOSIS — E119 Type 2 diabetes mellitus without complications: Secondary | ICD-10-CM | POA: Diagnosis not present

## 2014-12-16 DIAGNOSIS — Z880 Allergy status to penicillin: Secondary | ICD-10-CM | POA: Insufficient documentation

## 2014-12-16 DIAGNOSIS — R109 Unspecified abdominal pain: Secondary | ICD-10-CM | POA: Diagnosis present

## 2014-12-16 DIAGNOSIS — G43A1 Cyclical vomiting, intractable: Secondary | ICD-10-CM | POA: Diagnosis not present

## 2014-12-16 LAB — CBC WITH DIFFERENTIAL/PLATELET
BASOS ABS: 0.1 10*3/uL (ref 0–0.1)
BASOS PCT: 1 %
EOS ABS: 1.5 10*3/uL — AB (ref 0–0.7)
Eosinophils Relative: 13 %
HCT: 33.2 % — ABNORMAL LOW (ref 35.0–47.0)
HEMOGLOBIN: 10.4 g/dL — AB (ref 12.0–16.0)
LYMPHS ABS: 1.9 10*3/uL (ref 1.0–3.6)
Lymphocytes Relative: 17 %
MCH: 24.7 pg — ABNORMAL LOW (ref 26.0–34.0)
MCHC: 31.3 g/dL — ABNORMAL LOW (ref 32.0–36.0)
MCV: 78.9 fL — ABNORMAL LOW (ref 80.0–100.0)
Monocytes Absolute: 0.8 10*3/uL (ref 0.2–0.9)
Monocytes Relative: 7 %
NEUTROS PCT: 62 %
Neutro Abs: 7.1 10*3/uL — ABNORMAL HIGH (ref 1.4–6.5)
Platelets: 542 10*3/uL — ABNORMAL HIGH (ref 150–440)
RBC: 4.21 MIL/uL (ref 3.80–5.20)
RDW: 15.8 % — ABNORMAL HIGH (ref 11.5–14.5)
WBC: 11.4 10*3/uL — AB (ref 3.6–11.0)

## 2014-12-16 LAB — URINALYSIS COMPLETE WITH MICROSCOPIC (ARMC ONLY)
BACTERIA UA: NONE SEEN
Bilirubin Urine: NEGATIVE
Glucose, UA: NEGATIVE mg/dL
Hgb urine dipstick: NEGATIVE
Ketones, ur: NEGATIVE mg/dL
Leukocytes, UA: NEGATIVE
Nitrite: NEGATIVE
PH: 8 (ref 5.0–8.0)
PROTEIN: NEGATIVE mg/dL
SPECIFIC GRAVITY, URINE: 1.005 (ref 1.005–1.030)
Squamous Epithelial / LPF: NONE SEEN

## 2014-12-16 LAB — LIPASE, BLOOD: Lipase: 25 U/L (ref 22–51)

## 2014-12-16 LAB — COMPREHENSIVE METABOLIC PANEL
ALBUMIN: 3.3 g/dL — AB (ref 3.5–5.0)
ALK PHOS: 92 U/L (ref 38–126)
ALT: 12 U/L — AB (ref 14–54)
AST: 18 U/L (ref 15–41)
Anion gap: 6 (ref 5–15)
BUN: 10 mg/dL (ref 6–20)
CALCIUM: 8.7 mg/dL — AB (ref 8.9–10.3)
CHLORIDE: 105 mmol/L (ref 101–111)
CO2: 27 mmol/L (ref 22–32)
CREATININE: 0.94 mg/dL (ref 0.44–1.00)
GFR calc Af Amer: >60 mL/min (ref >60)
GFR calc non Af Amer: >60 mL/min (ref >60)
GLUCOSE: 118 mg/dL — AB (ref 65–99)
Potassium: 3.9 mmol/L (ref 3.5–5.1)
SODIUM: 138 mmol/L (ref 135–145)
Total Bilirubin: 0.2 mg/dL — ABNORMAL LOW (ref 0.3–1.2)
Total Protein: 6.3 g/dL — ABNORMAL LOW (ref 6.5–8.1)

## 2014-12-16 MED ORDER — METOCLOPRAMIDE HCL 5 MG/ML IJ SOLN
10.0000 mg | Freq: Once | INTRAMUSCULAR | Status: AC
  Administered 2014-12-16: 10 mg via INTRAVENOUS
  Filled 2014-12-16: qty 2

## 2014-12-16 MED ORDER — METOCLOPRAMIDE HCL 10 MG PO TABS: 10.0000 mg | ORAL_TABLET | Freq: Three times a day (TID) | ORAL | Status: DC | PRN

## 2014-12-16 MED ORDER — ONDANSETRON HCL 4 MG/2ML IJ SOLN
4.0000 mg | Freq: Once | INTRAMUSCULAR | Status: AC
  Administered 2014-12-16: 4 mg via INTRAVENOUS
  Filled 2014-12-16: qty 2

## 2014-12-16 MED ORDER — SODIUM CHLORIDE 0.9 % IV SOLN
1000.0000 mL | Freq: Once | INTRAVENOUS | Status: AC
  Administered 2014-12-16: 1000 mL via INTRAVENOUS

## 2014-12-16 MED ORDER — LORAZEPAM 2 MG/ML IJ SOLN
1.0000 mg | Freq: Once | INTRAMUSCULAR | Status: AC
  Administered 2014-12-16: 1 mg via INTRAVENOUS
  Filled 2014-12-16: qty 1

## 2014-12-16 MED ORDER — MORPHINE SULFATE (PF) 4 MG/ML IV SOLN
4.0000 mg | Freq: Once | INTRAVENOUS | Status: AC
  Administered 2014-12-16: 4 mg via INTRAVENOUS
  Filled 2014-12-16: qty 1

## 2014-12-16 MED ORDER — LORAZEPAM 1 MG PO TABS
1.0000 mg | ORAL_TABLET | Freq: Two times a day (BID) | ORAL | Status: AC
Start: 2014-12-16 — End: 2015-12-16

## 2014-12-16 NOTE — ED Notes (Signed)
Patient states been in pain for a year with LUQ pain.  Scheduled for endoscopy at [facility] Thursday and came in tonight due to the pain being intolerable.  Takes phenergen for nausea which is not currently helping also was RX reglan on last visit to the ER but is currently out and this helped with the pain only when eating baby food.  Eating solid foods makes the pain worse and nothing seems to make it better. Patient rates her pain as a 10 currently.

## 2014-12-16 NOTE — ED Notes (Signed)
Pt c/o intermittent left sided abd pain for about 1 year; scheduled for endoscopy at [facility] on Thursday for same symptoms; pt says she began having pain again this am; worsening throughout the day; has taken medication for nausea medicine with no relief; does not have any pain medication to take at home; tried heating pad with no relief;

## 2014-12-16 NOTE — ED Provider Notes (Signed)
[facility] Emergency Department Provider Note     Time seen: ----------------------------------------- 8:14 PM on 12/16/2014 -----------------------------------------    I have reviewed the triage vital signs and the nursing notes.   HISTORY  Chief Complaint Abdominal Pain    HPI Caroline Hughes is a 54 y.o. female who presents ER with left-sided abdominal pain for about a year. Patient states she has endoscopy scheduled [facility] for same. Her GI doctor think she has gastroparesis, however she has run out of her Reglan. She has been taking some other nausea medicines including Zofran and Phenergan without any relief. Nothing seems to make his symptoms better or worse.   Past Medical History  Diagnosis Date  . Arthritis   . Fibromyalgia   . Ulcerative colitis   . GERD (gastroesophageal reflux disease)   . Migraine   . Hyperlipemia   . Depression   . Anxiety   . Diabetes mellitus without complication   . Asthma   . Anemia     Iron deficiency  . Crohn's disease   . Endometriosis   . Herniated disc, cervical   . Gastric ulcer   . Peristomal hernia   . Intestinal obstruction     There are no active problems to display for this patient.   Past Surgical History  Procedure Laterality Date  . Abdominal hysterectomy    . Rectal fistula packing    . Ileostomy    . Hernia repair      Allergies Penicillins; Corn-containing products; Other; Shellfish allergy; Soy allergy; Sulfonylureas; Wheat bran; Humira; Statins; Sulfa antibiotics; and Vancomycin  Social History Social History  Substance Use Topics  . Smoking status: Never Smoker   . Smokeless tobacco: None  . Alcohol Use: No    Review of Systems Constitutional: Negative for fever. Eyes: Negative for visual changes. ENT: Negative for sore throat. Cardiovascular: Negative for chest pain. Respiratory: Negative for shortness of breath. Gastrointestinal: As if her abdominal pain and  vomiting Genitourinary: Negative for dysuria. Musculoskeletal: Negative for back pain. Skin: Negative for rash. Neurological: Negative for headaches, focal weakness or numbness.  10-point ROS otherwise negative.  ____________________________________________   PHYSICAL EXAM:  VITAL SIGNS: ED Triage Vitals  Enc Vitals Group     BP 12/16/14 1949 136/80 mmHg     Pulse Rate 12/16/14 1949 96     Resp 12/16/14 1949 20     Temp 12/16/14 1949 97.5 F (36.4 C)     Temp Source 12/16/14 1949 Oral     SpO2 12/16/14 1949 98 %     Weight 12/16/14 1949 140 lb (63.504 kg)     Height 12/16/14 1949 5' 2"  (1.575 m)     Head Cir --      Peak Flow --      Pain Score 12/16/14 1949 10     Pain Loc --      Pain Edu? --      Excl. in [place]? --     Constitutional: Alert and oriented. Well appearing and in no distress. Eyes: Conjunctivae are normal. PERRL. Normal extraocular movements. ENT   Head: Normocephalic and atraumatic.   Nose: No congestion/rhinnorhea.   Mouth/Throat: Mucous membranes are moist.   Neck: No stridor. Cardiovascular: Normal rate, regular rhythm. Normal and symmetric distal pulses are present in all extremities. No murmurs, rubs, or gallops. Respiratory: Normal respiratory effort without tachypnea nor retractions. Breath sounds are clear and equal bilaterally. No wheezes/rales/rhonchi. Gastrointestinal: Soft and nontender. No distention. No abdominal bruits.  Musculoskeletal: Nontender with normal range of motion in all extremities. No joint effusions.  No lower extremity tenderness nor edema. Neurologic:  Normal speech and language. No gross focal neurologic deficits are appreciated. Speech is normal. No gait instability. Skin:  Skin is warm, dry and intact. No rash noted. Psychiatric: Mood and affect are normal. Speech and behavior are normal. Patient exhibits appropriate insight and judgment.  ____________________________________________  ED COURSE:  Pertinent  labs & imaging results that were available during my care of the patient were reviewed by me and considered in my medical decision making (see chart for details). Patient will receive IV Reglan and Zofran with IV fluid bolus. We'll check basic labs and reevaluate. ____________________________________________    LABS (pertinent positives/negatives)  Labs Reviewed  CBC WITH DIFFERENTIAL/PLATELET - Abnormal; Notable for the following:    WBC 11.4 (*)    Hemoglobin 10.4 (*)    HCT 33.2 (*)    MCV 78.9 (*)    MCH 24.7 (*)    MCHC 31.3 (*)    RDW 15.8 (*)    Platelets 542 (*)    Neutro Abs 7.1 (*)    Eosinophils Absolute 1.5 (*)    All other components within normal limits  COMPREHENSIVE METABOLIC PANEL - Abnormal; Notable for the following:    Glucose, Bld 118 (*)    Calcium 8.7 (*)    Total Protein 6.3 (*)    Albumin 3.3 (*)    ALT 12 (*)    Total Bilirubin 0.2 (*)    All other components within normal limits  URINALYSIS COMPLETEWITH MICROSCOPIC ([facility] ONLY) - Abnormal; Notable for the following:    Color, Urine COLORLESS (*)    APPearance CLEAR (*)    All other components within normal limits  LIPASE, BLOOD   ____________________________________________  FINAL ASSESSMENT AND PLAN  Abdominal pain and vomiting  Plan: Patient with labs and imaging as dictated above. Patient with acute on chronic symptoms here. She'll be discharged with Reglan and encouraged to continue her outpatient appointment   Earleen Newport, MD   Earleen Newport, MD 12/16/14 2147

## 2014-12-16 NOTE — ED Notes (Signed)
Pt. Going home with friend

## 2014-12-16 NOTE — Discharge Instructions (Signed)
Cyclic Vomiting Syndrome Cyclic vomiting syndrome is a benign condition in which patients experience bouts or cycles of severe nausea and vomiting that last for hours or even days. The bouts of nausea and vomiting alternate with longer periods of no symptoms and generally good health. Cyclic vomiting syndrome occurs mostly in children, but can affect adults. CAUSES  [facility] has no known cause. Each episode is typically similar to the previous ones. The episodes tend to:   Start at about the same time of day.  Last the same length of time.  Present the same symptoms at the same level of intensity. Cyclic vomiting syndrome can begin at any age in children and adults. Cyclic vomiting syndrome usually starts between the ages of 3 and 7 years. In adults, episodes tend to occur less often than they do in children, but they last longer. Furthermore, the events or situations that trigger episodes in adults cannot always be pinpointed as easily as they can in children. There are 4 phases of cyclic vomiting syndrome: 1. Prodrome. The prodrome phase signals that an episode of nausea and vomiting is about to begin. This phase can last from just a few minutes to several hours. This phase is often marked by belly (abdominal) pain. Sometimes taking medicine early in the prodrome phase can stop an episode in progress. However, sometimes there is no warning. A person may simply wake up in the middle of the night or early morning and begin vomiting. 2. Episode. The episode phase consists of:  Severe vomiting.  Nausea.  Gagging (retching). 3. Recovery. The recovery phase begins when the nausea and vomiting stop. Healthy color, appetite, and energy return. 4. Symptom-free interval. The symptom-free interval phase is the period between episodes when no symptoms are present. TRIGGERS Episodes can be triggered by an infection or event. Examples of triggers include:  Infections.  Colds, allergies, sinus problems, and  the flu.  Eating certain foods such as chocolate or cheese.  Foods with monosodium glutamate (MSG) or preservatives.  Fast foods.  Pre-packaged foods.  Foods with low nutritional value (junk foods).  Overeating.  Eating just before going to bed.  Hot weather.  Dehydration.  Not enough sleep or poor sleep quality.  Physical exhaustion.  Menstruation.  Motion sickness.  Emotional stress (school or home difficulties).  Excitement or stress. SYMPTOMS  The main symptoms of cyclic vomiting syndrome are:  Severe vomiting.  Nausea.  Gagging (retching). Episodes usually begin at night or the first thing in the morning. Episodes may include vomiting or retching up to 5 or 6 times an hour during the worst of the episode. Episodes usually last anywhere from 1 to 4 days. Episodes can last for up to 10 days. Other symptoms include:  Paleness.  Exhaustion.  Listlessness.  Abdominal pain.  Loose stools or diarrhea. Sometimes the nausea and vomiting are so severe that a person appears to be almost unconscious. Sensitivity to light, headache, fever, dizziness, may also accompany an episode. In addition, the vomiting may cause drooling and excessive thirst. Drinking water usually leads to more vomiting, though the water can dilute the acid in the vomit, making the episode a little less painful. Continuous vomiting can lead to dehydration, which means that the body has lost excessive water and salts. DIAGNOSIS  Cyclic vomiting syndrome is hard to diagnose because there are no clear tests to identify it. A caregiver must diagnose cyclic vomiting syndrome by looking at symptoms and medical history. A caregiver must exclude more common diseases  or disorders that can also cause nausea and vomiting. Also, diagnosis takes time because caregivers need to identify a pattern or cycle to the vomiting. TREATMENT  Cyclic vomiting syndrome cannot be cured. Treatment varies, but people with  cyclic vomiting syndrome should get plenty of rest and sleep and take medications that prevent, stop, or lessen the vomiting episodes and other symptoms. People whose episodes are frequent and long-lasting may be treated during the symptom-free intervals in an effort to prevent or ease future episodes. The symptom-free phase is a good time to eliminate anything known to trigger an episode. For example, if episodes are brought on by stress or excitement, this period is the time to find ways to reduce stress and stay calm. If sinus problems or allergies cause episodes, those conditions should be treated. The triggers listed above should be avoided or prevented. Because of the similarities between migraine and cyclic vomiting syndrome, caregivers treat some people with severe cyclic vomiting syndrome with drugs that are also used for migraine headaches. The drugs are designed to:  Prevent episodes.  Reduce their frequency.  Lessen their severity. HOME CARE INSTRUCTIONS Once a vomiting episode begins, treatment is supportive. It helps to stay in bed and sleep in a dark, quiet room. Severe nausea and vomiting may require hospitalization and intravenous (IV) fluids to prevent dehydration. Relaxing medications (sedatives) may help if the nausea continues. Sometimes, during the prodrome phase, it is possible to stop an episode from happening altogether. Only take over-the-counter or prescription medicines for pain, discomfort or fever as directed by your caregiver. Do not give aspirin to children. During the recovery phase, drinking water and replacing lost electrolytes (salts in the blood) are very important. Electrolytes are salts that the body needs to function well and stay healthy. Symptoms during the recovery phase can vary. Some people find that their appetites return to normal immediately, while others need to begin by drinking clear liquids and then move slowly to solid food. RELATED COMPLICATIONS The  severe vomiting that defines cyclic vomiting syndrome is a risk factor for several complications:  Dehydration--Vomiting causes the body to lose water quickly.  Electrolyte imbalance--Vomiting also causes the body to lose the important salts it needs to keep working properly.  Peptic esophagitis--The tube that connects the mouth to the stomach (esophagus) becomes injured from the stomach acid that comes up with the vomit.  Hematemesis--The esophagus becomes irritated and bleeds, so blood mixes with the vomit.  Mallory-Weiss tear--The lower end of the esophagus may tear open or the stomach may bruise from vomiting or retching.  Tooth decay--The acid in the vomit can hurt the teeth by corroding the tooth enamel. SEEK MEDICAL CARE IF: You have questions or problems. Document Released: 06/16/2001 Document Revised: 06/30/2011 Document Reviewed: 07/15/2010 [facility] Patient Information 2015 [place], [place]. This information is not intended to replace advice given to you by your health care provider. Make sure you discuss any questions you have with your health care provider.

## 2015-04-22 DIAGNOSIS — J40 Bronchitis, not specified as acute or chronic: Secondary | ICD-10-CM

## 2015-04-22 HISTORY — DX: Bronchitis, not specified as acute or chronic: J40

## 2015-05-03 DIAGNOSIS — M797 Fibromyalgia: Secondary | ICD-10-CM | POA: Insufficient documentation

## 2015-05-03 DIAGNOSIS — Z8739 Personal history of other diseases of the musculoskeletal system and connective tissue: Secondary | ICD-10-CM | POA: Insufficient documentation

## 2015-07-25 DIAGNOSIS — E559 Vitamin D deficiency, unspecified: Secondary | ICD-10-CM | POA: Insufficient documentation

## 2015-09-03 ENCOUNTER — Other Ambulatory Visit: Payer: Self-pay | Admitting: Family Medicine

## 2015-09-03 DIAGNOSIS — Z1231 Encounter for screening mammogram for malignant neoplasm of breast: Secondary | ICD-10-CM

## 2015-11-19 DIAGNOSIS — L209 Atopic dermatitis, unspecified: Secondary | ICD-10-CM | POA: Insufficient documentation

## 2015-11-19 DIAGNOSIS — R1310 Dysphagia, unspecified: Secondary | ICD-10-CM | POA: Insufficient documentation

## 2015-11-19 DIAGNOSIS — L508 Other urticaria: Secondary | ICD-10-CM | POA: Insufficient documentation

## 2016-02-18 ENCOUNTER — Inpatient Hospital Stay: Payer: Medicare Other

## 2016-02-18 ENCOUNTER — Inpatient Hospital Stay: Payer: Medicare Other | Attending: Hematology and Oncology | Admitting: Hematology and Oncology

## 2016-02-18 DIAGNOSIS — M129 Arthropathy, unspecified: Secondary | ICD-10-CM | POA: Diagnosis not present

## 2016-02-18 DIAGNOSIS — Z794 Long term (current) use of insulin: Secondary | ICD-10-CM

## 2016-02-18 DIAGNOSIS — J45909 Unspecified asthma, uncomplicated: Secondary | ICD-10-CM

## 2016-02-18 DIAGNOSIS — E119 Type 2 diabetes mellitus without complications: Secondary | ICD-10-CM | POA: Diagnosis not present

## 2016-02-18 DIAGNOSIS — Z79899 Other long term (current) drug therapy: Secondary | ICD-10-CM | POA: Diagnosis not present

## 2016-02-18 DIAGNOSIS — F419 Anxiety disorder, unspecified: Secondary | ICD-10-CM | POA: Diagnosis not present

## 2016-02-18 DIAGNOSIS — Z7952 Long term (current) use of systemic steroids: Secondary | ICD-10-CM | POA: Diagnosis not present

## 2016-02-18 DIAGNOSIS — D649 Anemia, unspecified: Secondary | ICD-10-CM

## 2016-02-18 DIAGNOSIS — K5190 Ulcerative colitis, unspecified, without complications: Secondary | ICD-10-CM | POA: Diagnosis not present

## 2016-02-18 DIAGNOSIS — E785 Hyperlipidemia, unspecified: Secondary | ICD-10-CM | POA: Diagnosis not present

## 2016-02-18 DIAGNOSIS — K219 Gastro-esophageal reflux disease without esophagitis: Secondary | ICD-10-CM | POA: Diagnosis not present

## 2016-02-18 DIAGNOSIS — Z8719 Personal history of other diseases of the digestive system: Secondary | ICD-10-CM | POA: Insufficient documentation

## 2016-02-18 DIAGNOSIS — F329 Major depressive disorder, single episode, unspecified: Secondary | ICD-10-CM | POA: Diagnosis not present

## 2016-02-18 DIAGNOSIS — K435 Parastomal hernia without obstruction or  gangrene: Secondary | ICD-10-CM | POA: Diagnosis not present

## 2016-02-18 DIAGNOSIS — D509 Iron deficiency anemia, unspecified: Secondary | ICD-10-CM | POA: Insufficient documentation

## 2016-02-18 DIAGNOSIS — R5383 Other fatigue: Secondary | ICD-10-CM | POA: Insufficient documentation

## 2016-02-18 DIAGNOSIS — M797 Fibromyalgia: Secondary | ICD-10-CM | POA: Diagnosis not present

## 2016-02-18 DIAGNOSIS — K5090 Crohn's disease, unspecified, without complications: Secondary | ICD-10-CM

## 2016-02-18 LAB — CBC WITH DIFFERENTIAL/PLATELET
Basophils Absolute: 0.1 10*3/uL (ref 0–0.1)
Basophils Relative: 1 %
Eosinophils Absolute: 0.5 10*3/uL (ref 0–0.7)
Eosinophils Relative: 7 %
HCT: 28.6 % — ABNORMAL LOW (ref 35.0–47.0)
Hemoglobin: 8.7 g/dL — ABNORMAL LOW (ref 12.0–16.0)
Lymphocytes Relative: 17 %
Lymphs Abs: 1.2 10*3/uL (ref 1.0–3.6)
MCH: 20.8 pg — ABNORMAL LOW (ref 26.0–34.0)
MCHC: 30.3 g/dL — ABNORMAL LOW (ref 32.0–36.0)
MCV: 68.5 fL — ABNORMAL LOW (ref 80.0–100.0)
Monocytes Absolute: 0.4 10*3/uL (ref 0.2–0.9)
Monocytes Relative: 5 %
Neutro Abs: 5.2 10*3/uL (ref 1.4–6.5)
Neutrophils Relative %: 70 %
Platelets: 484 10*3/uL — ABNORMAL HIGH (ref 150–440)
RBC: 4.17 MIL/uL (ref 3.80–5.20)
RDW: 17.6 % — ABNORMAL HIGH (ref 11.5–14.5)
WBC: 7.4 10*3/uL (ref 3.6–11.0)

## 2016-02-18 LAB — IRON AND TIBC
Iron: 12 ug/dL — ABNORMAL LOW (ref 28–170)
Saturation Ratios: 2 % — ABNORMAL LOW (ref 10.4–31.8)
TIBC: 583 ug/dL — ABNORMAL HIGH (ref 250–450)
UIBC: 571 ug/dL

## 2016-02-18 LAB — FOLATE: Folate: 6.8 ng/mL (ref >5.9)

## 2016-02-18 LAB — VITAMIN B12: Vitamin B-12: 785 pg/mL (ref 180–914)

## 2016-02-18 LAB — FERRITIN: Ferritin: 4 ng/mL — ABNORMAL LOW (ref 11–307)

## 2016-02-18 LAB — SEDIMENTATION RATE: Sed Rate: 24 mm/hr (ref 0–30)

## 2016-02-18 NOTE — Progress Notes (Signed)
[facility] day:  02/18/2016  Chief Complaint: Caroline Hughes is a 55 y.o. female with Crohn's disease who is referred for iron deficiency anemia by Dr. Salome Holmes for assessment and management.  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  She has a history of skin reaction to Humira.  She has been on Cimzia since approximately 2013.   Ileoscopy and EGD at [facility] in 09/2012 was normal. EGD on 12/21/2014 revealed a normal esophagus and stomach and non-bleeding duodenal ulcers (largest 3 mm).  Ileoscopy at [facility] by Dr. Rayann Heman on 01/23/2014 for abdominal pain, worsening anemia, and dilated small bowel without transition point on CT revealed a normal study with normal biopsies of ileum.   She was last seen by Reita Cliche, PA at the [facility] on 12/26/2015.  CBC on 12/26/2015 revealed a hematocrit of 27.8, hemoglobin 8.1, and MCV 74.7.  CBC on 01/23/2016 revealed a hematocrit of 28.7, hemoglobin 8.2, and MCV 74.9.  Ferritin was 5 with an iron saturation of 2% (low) and a TIBC of 557.3 (high).  She states that she has diarrhea and can't eat for a few days.  She denies any blood in her stool.  She comments that she had blood in her stool a few times in the past associated with a stomach ulcer.  Her diet consists of a lot of vegetables and smoothies.  She does not eat red meat.  She eats chicken, fish, and [place].  She denies any vaginal bleeding s/p partial hysterectomy in 1998.  She had iron infusions 1 1/2 - 2 years ago with Dr. Lavone Neri.  She states that last year she fractured her hip and ankle and stopped coming.    Symptomatically, she is fatigued.  She denies any B symptoms.  She has shortness of breath on exertion.     Past Medical History:  Diagnosis Date  .  Anemia    Iron deficiency  . Anxiety   . Arthritis   . Asthma   . Crohn's disease   . Depression   . Diabetes mellitus without complication   . Endometriosis   . Fibromyalgia   . Gastric ulcer   . GERD (gastroesophageal reflux disease)   . Herniated disc, cervical   . Hyperlipemia   . Intestinal obstruction   . Migraine   . Peristomal hernia   . Ulcerative colitis     Past Surgical History:  Procedure Laterality Date  . ABDOMINAL HYSTERECTOMY    . HERNIA REPAIR    . ILEOSTOMY    . rectal fistula packing      No family history on file.  Social History:  reports that she has never smoked. She does not have any smokeless tobacco history on file. She reports that she does not drink alcohol. Her drug history is not on file.  She lives in [place].  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  The patient is alone today.  Allergies:  Allergies  Allergen Reactions  . Naproxen Other (See Comments)    Other Reaction: GI bleed  . Penicillins Swelling, Rash and Other (See Comments)    Reaction: fever  . Corn-Containing Products   . Other Diarrhea, Nausea And Vomiting and Other (See Comments)    "DIGESTIVE ISSUES" Humara Nuts  . Shellfish Allergy  Other (See Comments)    Reaction: "I get stuffed up, not a true allergy"  . Soy Allergy Other (See Comments)    Reaction: digestive issues.  . Sulfonylureas Other (See Comments)    Reaction: Pt isn't sure what medication this one was associated with or what the reaction was.  . Wheat Bran   . Doxycycline Rash  . Eggs Or Egg-Derived Products Other (See Comments)    Nasal congestion  . Humira [Adalimumab] Rash and Other (See Comments)    Reaction: fever.  . Peanut-Containing Drug Products Rash  . Simvastatin Rash  . Statins Rash  . Sulfa Antibiotics Rash  . Vancomycin Rash and Other (See Comments)    Reaction: fever    Current Medications: Current Outpatient Prescriptions  Medication Sig Dispense Refill  . albuterol (PROVENTIL  HFA;VENTOLIN HFA) 108 (90 BASE) MCG/ACT inhaler Inhale 2 puffs into the lungs every 6 (six) hours as needed for wheezing or shortness of breath.    . ALPRAZolam (XANAX) 1 MG tablet Take 1 mg by mouth.    . baclofen (LIORESAL) 10 MG tablet     . calcium carbonate (OS-CAL - DOSED IN MG OF ELEMENTAL CALCIUM) 1250 (500 Ca) MG tablet Take by mouth.    . Certolizumab Pegol (CIMZIA [place]) Inject 2 mLs into the skin. Once a month on the 17th    . Coenzyme Q10-Fish Oil-Vit E (CO-Q 10 OMEGA-3 FISH OIL) CAPS Take by mouth.    . cyanocobalamin (,VITAMIN B-12,) 1000 MCG/ML injection Inject into the muscle.    . cyclobenzaprine (FLEXERIL) 10 MG tablet Take 10 mg by mouth daily as needed for muscle spasms.    Marland Kitchen dicyclomine (BENTYL) 10 MG capsule Take by mouth.    . diphenhydrAMINE (BENADRYL) 25 mg capsule Take 2 capsules (50 mg total) by mouth every 6 (six) hours as needed. 60 capsule 0  . fluticasone-salmeterol (ADVAIR HFA) 230-21 MCG/ACT inhaler Inhale 2 puffs into the lungs 2 (two) times daily.    . folic acid (FOLVITE) 741 MCG tablet Take 400 mcg by mouth daily.    Marland Kitchen gabapentin (NEURONTIN) 100 MG capsule Take 200 mg by mouth 3 (three) times daily.    . hydrOXYzine (ATARAX/VISTARIL) 25 MG tablet Take 25 mg by mouth.    . hydrOXYzine (ATARAX/VISTARIL) 25 MG tablet Take by mouth.    . hyoscyamine (LEVSIN, ANASPAZ) 0.125 MG tablet Take 0.125 mg by mouth every 4 (four) hours as needed.    . insulin glargine (LANTUS) 100 UNIT/ML injection Inject 16 Units into the skin daily.     Marland Kitchen ipratropium-albuterol (DUONEB) 0.5-2.5 (3) MG/3ML SOLN Take 3 mLs by nebulization every 6 (six) hours as needed.    . Lactobacillus (ACIDOPHILUS PO) Take 1 capsule by mouth daily.    . lipase/protease/amylase (CREON) 12000 UNITS CPEP capsule Take 12,000 Units by mouth.    Marland Kitchen LORazepam (ATIVAN) 0.5 MG tablet Take 0.5 mg by mouth at bedtime.    . metoCLOPramide (REGLAN) 10 MG tablet Take 1 tablet (10 mg total) by mouth 4 (four) times daily -   before meals and at bedtime. 60 tablet 0  . metoCLOPramide (REGLAN) 10 MG tablet Take 1 tablet (10 mg total) by mouth every 8 (eight) hours as needed for nausea or vomiting. 30 tablet 1  . montelukast (SINGULAIR) 10 MG tablet Take 10 mg by mouth.    . ondansetron (ZOFRAN) 8 MG tablet Take 8 mg by mouth every 4 (four) hours as needed for nausea or vomiting.    Marland Kitchen  oxyCODONE-acetaminophen (ROXICET) 5-325 MG per tablet Take 1 tablet by mouth every 6 (six) hours as needed for severe pain. 12 tablet 0  . Pancrelipase, Lip-Prot-Amyl, 25000 units CPEP Take by mouth.    . pioglitazone (ACTOS) 15 MG tablet Take 15 mg by mouth daily.    . ranitidine (ZANTAC) 150 MG tablet TAKE 1 TABLET BY MOUTH EVERY NIGHT AT BEDTIME.    Marland Kitchen SAVELLA TITRATION PACK 12.5 & 25 & 50 MG MISC     . sucralfate (CARAFATE) 1 GM/10ML suspension Take by mouth.    . SUMAtriptan (IMITREX) 50 MG tablet Take 50 mg by mouth every 2 (two) hours as needed for migraine. May repeat in 2 hours if headache persists or recurs.    Marland Kitchen tiotropium (SPIRIVA) 18 MCG inhalation capsule Frequency:PRN   Dosage:18   MCG  Instructions:  Note:Dose: 18MCG    . tiZANidine (ZANAFLEX) 4 MG tablet Take 4 mg by mouth daily as needed for muscle spasms.    . traZODone (DESYREL) 50 MG tablet Take 50 mg by mouth at bedtime.     No current facility-administered medications for this visit.     Review of Systems:  GENERAL:  Fatigue.  No fevers or sweats.  No weight loss. PERFORMANCE STATUS (ECOG):  1 HEENT:  Vision fuzzy.  No runny nose, sore throat, mouth sores or tenderness. Lungs:  Asthma.  Shortness of breath with exertion.  No cough.  No hemoptysis. Cardiac:  No chest pain, palpitations, orthopnea, or PND. GI:  No nausea, vomiting, diarrhea, constipation, melena or hematochezia. GU:  No urgency, frequency, dysuria, or hematuria. Musculoskeletal:  Fibromyalgia.  Back pain.  Herniated disk in neck.  Scoliosis.  Osteopenia.  MRI planned. Extremities:  No pain or  swelling. Skin:  No rashes or skin changes. Neuro:  No headache, numbness or weakness, balance or coordination issues. Endocrine:  No diabetes, thyroid issues, hot flashes or night sweats. Psych:  No mood changes, depression or anxiety. Pain:  Back pain. Review of systems:  All other systems reviewed and found to be negative.  Physical Exam: Blood pressure 128/81, pulse 94, temperature 97.9 F (36.6 C), temperature source Tympanic, resp. rate 18, weight 145 lb 15.1 oz (66.2 kg). GENERAL: Fatigued appearing woman sitting comfortably in the exam room in no acute distress. MENTAL STATUS:  Alert and oriented to person, place and time. HEAD:  Dark hair.  Normocephalic, atraumatic, face symmetric, no Cushingoid features. EYES:  Oval glasses.  Brown eyes.  Pupils equal round and reactive to light and accomodation.  No conjunctivitis or scleral icterus. ENT:  Oropharynx clear without lesion.  Poor dentition.  Tongue normal.  Mucous membranes moist.  RESPIRATORY:  Clear to auscultation without rales, wheezes or rhonchi. CARDIOVASCULAR:  Regular rate and rhythm without murmur, rub or gallop. ABDOMEN:  Soft, non-tender, with active bowel sounds, and no hepatosplenomegaly.  No masses.  Ostomy. SKIN:  No rashes, ulcers or lesions. EXTREMITIES: No edema, no skin discoloration or tenderness.  No palpable cords. LYMPH NODES: No palpable cervical, supraclavicular, axillary or inguinal adenopathy  NEUROLOGICAL: Unremarkable. PSYCH:  Appropriate.   Appointment on 02/18/2016  Component Date Value Ref Range Status  . WBC 02/18/2016 7.4  3.6 - 11.0 K/uL Final  . RBC 02/18/2016 4.17  3.80 - 5.20 MIL/uL Final  . Hemoglobin 02/18/2016 8.7* 12.0 - 16.0 g/dL Final  . HCT 02/18/2016 28.6* 35.0 - 47.0 % Final  . MCV 02/18/2016 68.5* 80.0 - 100.0 fL Final  . MCH 02/18/2016 20.8* 26.0 -  34.0 pg Final  . MCHC 02/18/2016 30.3* 32.0 - 36.0 g/dL Final  . RDW 02/18/2016 17.6* 11.5 - 14.5 % Final  . Platelets  02/18/2016 484* 150 - 440 K/uL Final  . Neutrophils Relative % 02/18/2016 70  % Final  . Neutro Abs 02/18/2016 5.2  1.4 - 6.5 K/uL Final  . Lymphocytes Relative 02/18/2016 17  % Final  . Lymphs Abs 02/18/2016 1.2  1.0 - 3.6 K/uL Final  . Monocytes Relative 02/18/2016 5  % Final  . Monocytes Absolute 02/18/2016 0.4  0.2 - 0.9 K/uL Final  . Eosinophils Relative 02/18/2016 7  % Final  . Eosinophils Absolute 02/18/2016 0.5  0 - 0.7 K/uL Final  . Basophils Relative 02/18/2016 1  % Final  . Basophils Absolute 02/18/2016 0.1  0 - 0.1 K/uL Final  . Ferritin 02/18/2016 4* 11 - 307 ng/mL Final  . Iron 02/18/2016 12* 28 - 170 ug/dL Final  . TIBC 02/18/2016 583* 250 - 450 ug/dL Final  . Saturation Ratios 02/18/2016 2* 10.4 - 31.8 % Final  . UIBC 02/18/2016 571  ug/dL Final  . Sed Rate 02/18/2016 24  0 - 30 mm/hr Final  . Vitamin B-12 02/18/2016 785  180 - 914 pg/mL Final   Comment: (NOTE) This assay is not validated for testing neonatal or myeloproliferative syndrome specimens for Vitamin B12 levels. Performed at [facility]   . Folate 02/18/2016 6.8  >5.9 ng/mL Final    Assessment:  DAYSI BOGGAN is a 55 y.o. female with Crohn's disease and iron deficiency anemia.  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  Her diagnosis was changed to Crohn's disease.  She has a history of skin reaction to Humira.  She has been on Cimzia since approximately 2013.   Ileoscopy and EGD at [facility] in 09/2012 was normal.  EGD on 12/21/2014 revealed a normal esophagus and stomach and non-bleeding duodenal ulcers (largest 3 mm).  Ileoscopy at [facility] on 01/23/2014 revealed a normal study with normal biopsies of ileum.   CBC on 01/23/2016 revealed a hematocrit of 28.7, hemoglobin 8.2, and MCV 74.9.  Ferritin was 5 with an iron saturation of 2% (low) and a TIBC of 557.3 (high).  Her diet is  modest.  She eats a lot of vegetables.  She does not eat red meat.  She eats chicken, fish, and [place].    She denies any recent GI bleeding.  She denies any vaginal bleeding s/p partial hysterectomy in 1998.  She received IV iron 1.5 - 2 years ago.  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  Symptomatically, she is fatigued.  Exam is unremakable.  Plan: 1.  Discuss Crohn's disease and subsequent iron deficiency anemia.  Discuss prior history of iron infusions.  Discuss baseline labs and initiation of IV iron. 2.  Labs today:  CBC with diff, ferritin, iron studies, ESR, B12, folate. 3.  Preauth Venofer 4.  RTC weekly x 3 for Venofer 5.  RTC in 1 month for MD assessment, labs (CBC with diff, ferritin, iron studies-day before) and +/- Venofer.   Lequita Asal, MD  02/18/2016

## 2016-02-18 NOTE — Progress Notes (Signed)
Patient established.  Referred back by PCP.  Patient last seen for anemia in 2016.

## 2016-02-21 ENCOUNTER — Encounter: Payer: Self-pay | Admitting: Hematology and Oncology

## 2016-02-25 ENCOUNTER — Other Ambulatory Visit: Payer: Self-pay | Admitting: Hematology and Oncology

## 2016-02-25 ENCOUNTER — Ambulatory Visit: Payer: Medicare Other

## 2016-02-26 ENCOUNTER — Inpatient Hospital Stay: Payer: Medicare Other | Attending: Hematology and Oncology

## 2016-02-26 VITALS — BP 117/74 | HR 94 | Temp 97.1°F | Resp 20

## 2016-02-26 DIAGNOSIS — Z794 Long term (current) use of insulin: Secondary | ICD-10-CM | POA: Insufficient documentation

## 2016-02-26 DIAGNOSIS — K435 Parastomal hernia without obstruction or  gangrene: Secondary | ICD-10-CM | POA: Diagnosis not present

## 2016-02-26 DIAGNOSIS — Z79899 Other long term (current) drug therapy: Secondary | ICD-10-CM | POA: Insufficient documentation

## 2016-02-26 DIAGNOSIS — Z8719 Personal history of other diseases of the digestive system: Secondary | ICD-10-CM | POA: Diagnosis not present

## 2016-02-26 DIAGNOSIS — F329 Major depressive disorder, single episode, unspecified: Secondary | ICD-10-CM | POA: Diagnosis not present

## 2016-02-26 DIAGNOSIS — D509 Iron deficiency anemia, unspecified: Secondary | ICD-10-CM | POA: Insufficient documentation

## 2016-02-26 DIAGNOSIS — K5090 Crohn's disease, unspecified, without complications: Secondary | ICD-10-CM | POA: Insufficient documentation

## 2016-02-26 DIAGNOSIS — K219 Gastro-esophageal reflux disease without esophagitis: Secondary | ICD-10-CM | POA: Diagnosis not present

## 2016-02-26 DIAGNOSIS — M797 Fibromyalgia: Secondary | ICD-10-CM | POA: Insufficient documentation

## 2016-02-26 DIAGNOSIS — D649 Anemia, unspecified: Secondary | ICD-10-CM

## 2016-02-26 DIAGNOSIS — E785 Hyperlipidemia, unspecified: Secondary | ICD-10-CM | POA: Diagnosis not present

## 2016-02-26 DIAGNOSIS — E119 Type 2 diabetes mellitus without complications: Secondary | ICD-10-CM | POA: Diagnosis not present

## 2016-02-26 DIAGNOSIS — K5190 Ulcerative colitis, unspecified, without complications: Secondary | ICD-10-CM | POA: Diagnosis not present

## 2016-02-26 DIAGNOSIS — F419 Anxiety disorder, unspecified: Secondary | ICD-10-CM | POA: Insufficient documentation

## 2016-02-26 DIAGNOSIS — M129 Arthropathy, unspecified: Secondary | ICD-10-CM | POA: Insufficient documentation

## 2016-02-26 DIAGNOSIS — Z7952 Long term (current) use of systemic steroids: Secondary | ICD-10-CM | POA: Insufficient documentation

## 2016-02-26 DIAGNOSIS — J45909 Unspecified asthma, uncomplicated: Secondary | ICD-10-CM | POA: Diagnosis not present

## 2016-02-26 DIAGNOSIS — R5383 Other fatigue: Secondary | ICD-10-CM | POA: Diagnosis not present

## 2016-02-26 MED ORDER — SODIUM CHLORIDE 0.9 % IV SOLN: 200.0000 mg | Freq: Once | INTRAVENOUS | Status: DC

## 2016-02-26 MED ORDER — SODIUM CHLORIDE 0.9 % IV SOLN
Freq: Once | INTRAVENOUS | Status: AC
  Administered 2016-02-26: 14:00:00 via INTRAVENOUS
  Filled 2016-02-26: qty 1000

## 2016-02-26 MED ORDER — IRON SUCROSE 20 MG/ML IV SOLN
200.0000 mg | Freq: Once | INTRAVENOUS | Status: AC
  Administered 2016-02-26: 200 mg via INTRAVENOUS
  Filled 2016-02-26: qty 10

## 2016-03-03 ENCOUNTER — Inpatient Hospital Stay: Payer: Medicare Other

## 2016-03-03 VITALS — BP 123/73 | HR 101 | Temp 97.7°F | Resp 18

## 2016-03-03 DIAGNOSIS — D509 Iron deficiency anemia, unspecified: Secondary | ICD-10-CM | POA: Diagnosis not present

## 2016-03-03 DIAGNOSIS — D649 Anemia, unspecified: Secondary | ICD-10-CM

## 2016-03-03 MED ORDER — SODIUM CHLORIDE 0.9 % IV SOLN
Freq: Once | INTRAVENOUS | Status: AC
  Administered 2016-03-03: 15:00:00 via INTRAVENOUS
  Filled 2016-03-03: qty 1000

## 2016-03-03 MED ORDER — IRON SUCROSE 20 MG/ML IV SOLN
200.0000 mg | Freq: Once | INTRAVENOUS | Status: AC
  Administered 2016-03-03: 200 mg via INTRAVENOUS
  Filled 2016-03-03: qty 10

## 2016-03-03 MED ORDER — SODIUM CHLORIDE 0.9 % IV SOLN: 200.0000 mg | Freq: Once | INTRAVENOUS | Status: DC

## 2016-03-05 ENCOUNTER — Other Ambulatory Visit: Payer: Self-pay | Admitting: Family Medicine

## 2016-03-05 DIAGNOSIS — M5412 Radiculopathy, cervical region: Secondary | ICD-10-CM

## 2016-03-10 ENCOUNTER — Inpatient Hospital Stay: Payer: Medicare Other

## 2016-03-10 VITALS — BP 124/78 | HR 96 | Temp 96.7°F | Resp 18

## 2016-03-10 DIAGNOSIS — D649 Anemia, unspecified: Secondary | ICD-10-CM

## 2016-03-10 DIAGNOSIS — D509 Iron deficiency anemia, unspecified: Secondary | ICD-10-CM | POA: Diagnosis not present

## 2016-03-10 MED ORDER — IRON SUCROSE 20 MG/ML IV SOLN
200.0000 mg | Freq: Once | INTRAVENOUS | Status: AC
  Administered 2016-03-10: 200 mg via INTRAVENOUS
  Filled 2016-03-10: qty 10

## 2016-03-10 MED ORDER — SODIUM CHLORIDE 0.9 % IV SOLN
Freq: Once | INTRAVENOUS | Status: AC
  Administered 2016-03-10: 14:00:00 via INTRAVENOUS
  Filled 2016-03-10: qty 1000

## 2016-03-11 ENCOUNTER — Other Ambulatory Visit: Payer: Self-pay | Admitting: *Deleted

## 2016-03-11 DIAGNOSIS — D508 Other iron deficiency anemias: Secondary | ICD-10-CM

## 2016-03-15 ENCOUNTER — Ambulatory Visit
Admission: RE | Admit: 2016-03-15 | Discharge: 2016-03-15 | Disposition: A | Discharge status: Home / Self Care | Payer: Medicare Other | Source: Ambulatory Visit | Attending: Family Medicine | Admitting: Family Medicine

## 2016-03-15 DIAGNOSIS — M4802 Spinal stenosis, cervical region: Secondary | ICD-10-CM | POA: Diagnosis not present

## 2016-03-15 DIAGNOSIS — M5011 Cervical disc disorder with radiculopathy,  high cervical region: Secondary | ICD-10-CM | POA: Diagnosis not present

## 2016-03-15 DIAGNOSIS — M4722 Other spondylosis with radiculopathy, cervical region: Secondary | ICD-10-CM | POA: Insufficient documentation

## 2016-03-15 DIAGNOSIS — M5412 Radiculopathy, cervical region: Secondary | ICD-10-CM

## 2016-03-15 LAB — POCT I-STAT CREATININE: Creatinine, Ser: 0.8 mg/dL (ref 0.44–1.00)

## 2016-03-15 MED ORDER — GADOBENATE DIMEGLUMINE 529 MG/ML IV SOLN
15.0000 mL | Freq: Once | INTRAVENOUS | Status: AC | PRN
  Administered 2016-03-15: 13 mL via INTRAVENOUS

## 2016-03-16 ENCOUNTER — Other Ambulatory Visit: Payer: Self-pay | Admitting: Hematology and Oncology

## 2016-03-16 ENCOUNTER — Encounter: Payer: Self-pay | Admitting: Hematology and Oncology

## 2016-03-16 NOTE — Progress Notes (Addendum)
[facility] day:  03/17/2016  Chief Complaint: Caroline Hughes is a 55 y.o. female with Crohn's disease and iron deficiency anemia who is seen for 1 month assessment after initiation of IV iron.  HPI:  The patient was last seen in the hematology clinic on 02/18/2016.  At that time, she was seen for initial assessment by me.  She had a history of iron deficiency.  She had received IV iron in the past.  She denied any GI bleeding.  Ileoscopy on 01/23/2014 was normal.  Diet was modest.  Labs at last visit included a hematocrit of 28.6, hemoglobin 8.7, MCV 68.5, platelets 484,000, white count 7400 with an [place] of 5200.  Ferritin was 4.  Iron studies included a saturation of 2% and a TIBC of 583.  Sedimentation rate was 24.  B12 was 785.  Folate was 6.8.  She received Venofer 200 mg IV x 3 (02/26/2016, 03/03/2016, and 03/10/2016).  Symptomatically, she is feeling good.  She notes poor sleep.  She pinched a nerve in her back.  She had an MRI of her cervical spine on Saturday,03/15/2016.   She has a follow-up with orthopedics on 03/20/2016.  Cervical spine MRI on 03/15/2016 revealed cervical spondylosis and degenerative disc disease causing moderate impingement at C4-5 and C5-6, and mild to moderate impingement at C3-4, as detailed above. The central narrowing of the thecal sac components was slightly more notable than on the prior exam, with the foraminal impingement similar to prior.  There was no significant abnormal enhancement in the cord.   Past Medical History:  Diagnosis Date  . Anemia    Iron deficiency  . Anxiety   . Arthritis   . Asthma   . Crohn's disease ([place])   . Depression   . Diabetes mellitus without complication ([place])   . Endometriosis   . Fibromyalgia   . Gastric ulcer   . GERD (gastroesophageal reflux disease)   . Herniated disc, cervical   . Hyperlipemia   . Intestinal obstruction   . Migraine   . Peristomal hernia   .  Ulcerative colitis [facility])     Past Surgical History:  Procedure Laterality Date  . ABDOMINAL HYSTERECTOMY    . HERNIA REPAIR    . ILEOSTOMY    . rectal fistula packing      No family history on file.  Social History:  reports that she has never smoked. She does not have any smokeless tobacco history on file. She reports that she does not drink alcohol. Her drug history is not on file.  She lives in [place].  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  The patient is alone today.  Allergies:  Allergies  Allergen Reactions  . Naproxen Other (See Comments)    Other Reaction: GI bleed  . Penicillins Swelling, Rash and Other (See Comments)    Reaction: fever  . Corn-Containing Products   . Other Diarrhea, Nausea And Vomiting and Other (See Comments)    "DIGESTIVE ISSUES" Humara Nuts  . Shellfish Allergy Other (See Comments)    Reaction: "I get stuffed up, not a true allergy"  . Soy Allergy Other (See Comments)    Reaction: digestive issues.  . Sulfonylureas Other (See Comments)    Reaction: Pt isn't sure what medication this one was associated with or what the reaction was.  . Wheat Bran   . Doxycycline Rash  . Eggs Or Egg-Derived Products Other (See Comments)    Nasal  congestion  . Humira [Adalimumab] Rash and Other (See Comments)    Reaction: fever.  . Peanut-Containing Drug Products Rash  . Simvastatin Rash  . Statins Rash  . Sulfa Antibiotics Rash  . Vancomycin Rash and Other (See Comments)    Reaction: fever    Current Medications: Current Outpatient Prescriptions  Medication Sig Dispense Refill  . albuterol (PROVENTIL HFA;VENTOLIN HFA) 108 (90 BASE) MCG/ACT inhaler Inhale 2 puffs into the lungs every 6 (six) hours as needed for wheezing or shortness of breath.    . ALPRAZolam (XANAX) 1 MG tablet Take 1 mg by mouth.    . baclofen (LIORESAL) 10 MG tablet     . calcium carbonate (OS-CAL - DOSED IN MG OF ELEMENTAL CALCIUM) 1250 (500 Ca) MG tablet Take by mouth.    .  Certolizumab Pegol (CIMZIA [place]) Inject 2 mLs into the skin. Once a month on the 17th    . Coenzyme Q10-Fish Oil-Vit E (CO-Q 10 OMEGA-3 FISH OIL) CAPS Take by mouth.    . cyanocobalamin (,VITAMIN B-12,) 1000 MCG/ML injection Inject into the muscle.    . cyclobenzaprine (FLEXERIL) 10 MG tablet Take 10 mg by mouth daily as needed for muscle spasms.    Marland Kitchen dicyclomine (BENTYL) 10 MG capsule Take by mouth.    . diphenhydrAMINE (BENADRYL) 25 mg capsule Take 2 capsules (50 mg total) by mouth every 6 (six) hours as needed. 60 capsule 0  . fluticasone-salmeterol (ADVAIR HFA) 230-21 MCG/ACT inhaler Inhale 2 puffs into the lungs 2 (two) times daily.    . folic acid (FOLVITE) 170 MCG tablet Take 400 mcg by mouth daily.    Marland Kitchen gabapentin (NEURONTIN) 100 MG capsule Take 200 mg by mouth 3 (three) times daily.    . hydrOXYzine (ATARAX/VISTARIL) 25 MG tablet Take 25 mg by mouth.    . hydrOXYzine (ATARAX/VISTARIL) 25 MG tablet Take by mouth.    . hyoscyamine (LEVSIN, ANASPAZ) 0.125 MG tablet Take 0.125 mg by mouth every 4 (four) hours as needed.    . insulin glargine (LANTUS) 100 UNIT/ML injection Inject 16 Units into the skin daily.     Marland Kitchen ipratropium-albuterol (DUONEB) 0.5-2.5 (3) MG/3ML SOLN Take 3 mLs by nebulization every 6 (six) hours as needed.    . Lactobacillus (ACIDOPHILUS PO) Take 1 capsule by mouth daily.    . lipase/protease/amylase (CREON) 12000 UNITS CPEP capsule Take 12,000 Units by mouth.    Marland Kitchen LORazepam (ATIVAN) 0.5 MG tablet Take 0.5 mg by mouth at bedtime.    . metoCLOPramide (REGLAN) 10 MG tablet Take 1 tablet (10 mg total) by mouth 4 (four) times daily -  before meals and at bedtime. 60 tablet 0  . metoCLOPramide (REGLAN) 10 MG tablet Take 1 tablet (10 mg total) by mouth every 8 (eight) hours as needed for nausea or vomiting. 30 tablet 1  . montelukast (SINGULAIR) 10 MG tablet Take 10 mg by mouth.    . ondansetron (ZOFRAN) 8 MG tablet Take 8 mg by mouth every 4 (four) hours as needed for nausea or  vomiting.    Marland Kitchen oxyCODONE-acetaminophen (ROXICET) 5-325 MG per tablet Take 1 tablet by mouth every 6 (six) hours as needed for severe pain. 12 tablet 0  . Pancrelipase, Lip-Prot-Amyl, 25000 units CPEP Take by mouth.    . pioglitazone (ACTOS) 15 MG tablet Take 15 mg by mouth daily.    . ranitidine (ZANTAC) 150 MG tablet TAKE 1 TABLET BY MOUTH EVERY NIGHT AT BEDTIME.    Marland Kitchen [place]  12.5 & 25 & 50 MG MISC     . sucralfate (CARAFATE) 1 GM/10ML suspension Take by mouth.    . SUMAtriptan (IMITREX) 50 MG tablet Take 50 mg by mouth every 2 (two) hours as needed for migraine. May repeat in 2 hours if headache persists or recurs.    Marland Kitchen tiotropium (SPIRIVA) 18 MCG inhalation capsule Frequency:PRN   Dosage:18   MCG  Instructions:  Note:Dose: 18MCG    . tiZANidine (ZANAFLEX) 4 MG tablet Take 4 mg by mouth daily as needed for muscle spasms.    . traZODone (DESYREL) 50 MG tablet Take 50 mg by mouth at bedtime.     No current facility-administered medications for this visit.     Review of Systems:  GENERAL:  Feels good.  No fevers or sweats.  Weight down 1 pound. PERFORMANCE STATUS (ECOG):  1 HEENT:  No runny nose, sore throat, mouth sores or tenderness. Lungs:  Asthma.  Shortness of breath with exertion.  No cough.  No hemoptysis. Cardiac:  No chest pain, palpitations, orthopnea, or PND. GI:  No nausea, vomiting, diarrhea, constipation, melena or hematochezia. GU:  No urgency, frequency, dysuria, or hematuria. Musculoskeletal:  Fibromyalgia.  Back pain.  Herniated disk in neck.  Scoliosis.  Osteopenia.  Extremities:  No pain or swelling. Skin:  No rashes or skin changes. Neuro:  Pinched nerve.  No headache, numbness or weakness, balance or coordination issues.  Recent MRI (see HPI). Endocrine:  No diabetes, thyroid issues, hot flashes or night sweats. Psych:  Poor sleep.  No mood changes, depression or anxiety. Pain:  Back pain. Review of systems:  All other systems reviewed and found to be  negative.  Physical Exam: Blood pressure 124/83, pulse 86, temperature (!) 96.5 F (35.8 C), temperature source Tympanic, resp. rate 18, weight 144 lb 6.4 oz (65.5 kg). GENERAL: Slightly fatigued appearing woman sitting comfortably in the exam room in no acute distress. MENTAL STATUS:  Alert and oriented to person, place and time. HEAD:  Brown hair.  Normocephalic, atraumatic, face symmetric, no Cushingoid features. EYES:  Oval glasses.  Brown eyes.  Pupils equal round and reactive to light and accomodation.  No conjunctivitis or scleral icterus. ENT:  Oropharynx clear without lesion.  Poor dentition.  Tongue normal.  Mucous membranes moist.  RESPIRATORY:  Clear to auscultation without rales, wheezes or rhonchi. CARDIOVASCULAR:  Regular rate and rhythm without murmur, rub or gallop. ABDOMEN:  Soft, non-tender, with active bowel sounds, and no hepatosplenomegaly.  No masses.  Ostomy. SKIN:  No rashes, ulcers or lesions. EXTREMITIES: No edema, no skin discoloration or tenderness.  No palpable cords. LYMPH NODES: No palpable cervical, supraclavicular, axillary or inguinal adenopathy  NEUROLOGICAL: Unremarkable. PSYCH:  Appropriate.   Appointment on 03/17/2016  Component Date Value Ref Range Status  . WBC 03/17/2016 6.6  3.6 - 11.0 K/uL Final  . RBC 03/17/2016 4.73  3.80 - 5.20 MIL/uL Final  . Hemoglobin 03/17/2016 11.5* 12.0 - 16.0 g/dL Final  . HCT 03/17/2016 36.0  35.0 - 47.0 % Final  . MCV 03/17/2016 76.1* 80.0 - 100.0 fL Final  . MCH 03/17/2016 24.2* 26.0 - 34.0 pg Final  . MCHC 03/17/2016 31.9* 32.0 - 36.0 g/dL Final  . RDW 03/17/2016 29.1* 11.5 - 14.5 % Final  . Platelets 03/17/2016 516* 150 - 440 K/uL Final  . Neutrophils Relative % 03/17/2016 68  % Final  . Neutro Abs 03/17/2016 4.5  1.4 - 6.5 K/uL Final  . Lymphocytes Relative 03/17/2016 19  %  Final  . Lymphs Abs 03/17/2016 1.2  1.0 - 3.6 K/uL Final  . Monocytes Relative 03/17/2016 5  % Final  . Monocytes Absolute 03/17/2016  0.3  0.2 - 0.9 K/uL Final  . Eosinophils Relative 03/17/2016 7  % Final  . Eosinophils Absolute 03/17/2016 0.5  0 - 0.7 K/uL Final  . Basophils Relative 03/17/2016 1  % Final  . Basophils Absolute 03/17/2016 0.1  0 - 0.1 K/uL Final  . Ferritin 03/17/2016 70  11 - 307 ng/mL Final  . Iron 03/17/2016 42  28 - 170 ug/dL Final  . TIBC 03/17/2016 436  250 - 450 ug/dL Final  . Saturation Ratios 03/17/2016 10* 10.4 - 31.8 % Final  . UIBC 03/17/2016 394  ug/dL Final  Hospital Outpatient Visit on 03/15/2016  Component Date Value Ref Range Status  . Creatinine, Ser 03/15/2016 0.80  0.44 - 1.00 mg/dL Final    Assessment:  SHARLETT LIENEMANN is a 56 y.o. female with Crohn's disease and iron deficiency anemia.  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  Her diagnosis was changed to Crohn's disease.  She has a history of skin reaction to Humira.  She has been on Cimzia since approximately 2013.   [facility] and EGD [facility] in 09/2012 was normal.  EGD on 12/21/2014 revealed a normal esophagus and stomach and non-bleeding duodenal ulcers (largest 3 mm).  [facility] on 01/23/2014 revealed a normal study with normal biopsies of ileum.   Diet is modest.  She denies any GI or vaginal bleeding.  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  Work-up on 02/18/2016 revealed a  hematocrit of 28.6, hemoglobin 8.7, MCV 68.5, platelets 484,000, white count 7400 with an ANC of 5200.  Ferritin was 4.  Iron studies included a saturation of 2% and a TIBC of 583.  Sedimentation rate was 24.  B12 was 785.  Folate was 6.8.  She received IV iron 1.5 - 2 years ago.  She received Venofer 200 mg IV weekly x 3 (02/26/2016 - 03/10/2016).  Hematocrit has improved from 28.6 to 36.  Ferritin has improved from 4 to 70.  Symptomatically, she feels better.  She is having issues with her back.  Exam is  unremakable.  Plan: 1.  Labs today:  CBC with diff, ferritin, iron studies. 2.  No Venofer today. 3.  RTC in 2 months for labs (CBC with diff, ferritin). 4.  RTC in 4 months for MD assessment, labs (CBC with diff, ferritin, iron studies-day before), and +/- Venofer.   Lequita Asal, MD  03/17/2016, 2:28 PM

## 2016-03-17 ENCOUNTER — Inpatient Hospital Stay: Payer: Medicare Other

## 2016-03-17 ENCOUNTER — Inpatient Hospital Stay (HOSPITAL_BASED_OUTPATIENT_CLINIC_OR_DEPARTMENT_OTHER): Payer: Medicare Other | Admitting: Hematology and Oncology

## 2016-03-17 VITALS — BP 124/83 | HR 86 | Temp 96.5°F | Resp 18 | Wt 144.4 lb

## 2016-03-17 DIAGNOSIS — K5190 Ulcerative colitis, unspecified, without complications: Secondary | ICD-10-CM

## 2016-03-17 DIAGNOSIS — Z8719 Personal history of other diseases of the digestive system: Secondary | ICD-10-CM

## 2016-03-17 DIAGNOSIS — D509 Iron deficiency anemia, unspecified: Secondary | ICD-10-CM | POA: Diagnosis not present

## 2016-03-17 DIAGNOSIS — Z79899 Other long term (current) drug therapy: Secondary | ICD-10-CM

## 2016-03-17 DIAGNOSIS — E785 Hyperlipidemia, unspecified: Secondary | ICD-10-CM

## 2016-03-17 DIAGNOSIS — R5383 Other fatigue: Secondary | ICD-10-CM | POA: Diagnosis not present

## 2016-03-17 DIAGNOSIS — K5090 Crohn's disease, unspecified, without complications: Secondary | ICD-10-CM

## 2016-03-17 DIAGNOSIS — K435 Parastomal hernia without obstruction or  gangrene: Secondary | ICD-10-CM

## 2016-03-17 DIAGNOSIS — D508 Other iron deficiency anemias: Secondary | ICD-10-CM

## 2016-03-17 DIAGNOSIS — M129 Arthropathy, unspecified: Secondary | ICD-10-CM

## 2016-03-17 DIAGNOSIS — K219 Gastro-esophageal reflux disease without esophagitis: Secondary | ICD-10-CM

## 2016-03-17 DIAGNOSIS — F329 Major depressive disorder, single episode, unspecified: Secondary | ICD-10-CM

## 2016-03-17 DIAGNOSIS — E119 Type 2 diabetes mellitus without complications: Secondary | ICD-10-CM

## 2016-03-17 DIAGNOSIS — M797 Fibromyalgia: Secondary | ICD-10-CM

## 2016-03-17 DIAGNOSIS — J45909 Unspecified asthma, uncomplicated: Secondary | ICD-10-CM

## 2016-03-17 DIAGNOSIS — Z7952 Long term (current) use of systemic steroids: Secondary | ICD-10-CM

## 2016-03-17 DIAGNOSIS — Z794 Long term (current) use of insulin: Secondary | ICD-10-CM

## 2016-03-17 DIAGNOSIS — F419 Anxiety disorder, unspecified: Secondary | ICD-10-CM

## 2016-03-17 LAB — IRON AND TIBC
Iron: 42 ug/dL (ref 28–170)
Saturation Ratios: 10 % — ABNORMAL LOW (ref 10.4–31.8)
TIBC: 436 ug/dL (ref 250–450)
UIBC: 394 ug/dL

## 2016-03-17 LAB — CBC WITH DIFFERENTIAL/PLATELET
Basophils Absolute: 0.1 10*3/uL (ref 0–0.1)
Basophils Relative: 1 %
Eosinophils Absolute: 0.5 10*3/uL (ref 0–0.7)
Eosinophils Relative: 7 %
HCT: 36 % (ref 35.0–47.0)
Hemoglobin: 11.5 g/dL — ABNORMAL LOW (ref 12.0–16.0)
Lymphocytes Relative: 19 %
Lymphs Abs: 1.2 10*3/uL (ref 1.0–3.6)
MCH: 24.2 pg — ABNORMAL LOW (ref 26.0–34.0)
MCHC: 31.9 g/dL — ABNORMAL LOW (ref 32.0–36.0)
MCV: 76.1 fL — ABNORMAL LOW (ref 80.0–100.0)
Monocytes Absolute: 0.3 10*3/uL (ref 0.2–0.9)
Monocytes Relative: 5 %
Neutro Abs: 4.5 10*3/uL (ref 1.4–6.5)
Neutrophils Relative %: 68 %
Platelets: 516 10*3/uL — ABNORMAL HIGH (ref 150–440)
RBC: 4.73 MIL/uL (ref 3.80–5.20)
RDW: 29.1 % — ABNORMAL HIGH (ref 11.5–14.5)
WBC: 6.6 10*3/uL (ref 3.6–11.0)

## 2016-03-17 LAB — FERRITIN: Ferritin: 70 ng/mL (ref 11–307)

## 2016-05-04 ENCOUNTER — Encounter: Payer: Self-pay | Admitting: Hematology and Oncology

## 2016-05-12 ENCOUNTER — Telehealth: Payer: Self-pay | Admitting: *Deleted

## 2016-05-12 NOTE — Telephone Encounter (Signed)
Patient requested to come in early for labs for her next appointment. Lab approved early arrival. Patients phone has been disconnected.

## 2016-05-19 ENCOUNTER — Inpatient Hospital Stay: Payer: Medicare Other | Attending: Hematology and Oncology

## 2016-05-19 DIAGNOSIS — D509 Iron deficiency anemia, unspecified: Secondary | ICD-10-CM | POA: Insufficient documentation

## 2016-05-19 DIAGNOSIS — D508 Other iron deficiency anemias: Secondary | ICD-10-CM

## 2016-05-19 LAB — CBC WITH DIFFERENTIAL/PLATELET
Basophils Absolute: 0.1 10*3/uL (ref 0–0.1)
Basophils Relative: 1 %
Eosinophils Absolute: 0.4 10*3/uL (ref 0–0.7)
Eosinophils Relative: 6 %
HCT: 41.1 % (ref 35.0–47.0)
Hemoglobin: 14 g/dL (ref 12.0–16.0)
Lymphocytes Relative: 23 %
Lymphs Abs: 1.5 10*3/uL (ref 1.0–3.6)
MCH: 29.6 pg (ref 26.0–34.0)
MCHC: 34 g/dL (ref 32.0–36.0)
MCV: 87.2 fL (ref 80.0–100.0)
Monocytes Absolute: 0.4 10*3/uL (ref 0.2–0.9)
Monocytes Relative: 6 %
Neutro Abs: 4.2 10*3/uL (ref 1.4–6.5)
Neutrophils Relative %: 64 %
Platelets: 360 10*3/uL (ref 150–440)
RBC: 4.71 MIL/uL (ref 3.80–5.20)
RDW: 22.4 % — ABNORMAL HIGH (ref 11.5–14.5)
WBC: 6.5 10*3/uL (ref 3.6–11.0)

## 2016-05-19 LAB — FERRITIN: Ferritin: 15 ng/mL (ref 11–307)

## 2016-05-22 ENCOUNTER — Telehealth: Payer: Self-pay | Admitting: *Deleted

## 2016-05-22 ENCOUNTER — Other Ambulatory Visit: Payer: Self-pay | Admitting: Hematology and Oncology

## 2016-05-22 NOTE — Telephone Encounter (Signed)
Called patient to inform her that she is not anemic, however, her ferritin is 15. Informed her that MD recommends IV venofer.  Patient in agreement.    Informed scheduler to add patient to infusion schedule and call patient.   MD to put order in [facility].

## 2016-05-22 NOTE — Telephone Encounter (Signed)
-----   Message from Lequita Asal, MD sent at 05/22/2016  3:35 PM EST ----- Regarding: Please call patient  Patient is not anemic.  However, ferritin has decreased to 15.  Consider IV iron if ferritin < 30.  If patient wants IV iron, Venofer 200 mg IV x 1  M  ----- Message ----- From: Interface, Lab In [place] Sent: 05/19/2016  10:41 AM To: Lequita Asal, MD

## 2016-05-28 ENCOUNTER — Inpatient Hospital Stay: Payer: Medicare Other | Attending: Hematology and Oncology

## 2016-06-13 ENCOUNTER — Other Ambulatory Visit: Payer: Self-pay | Admitting: *Deleted

## 2016-06-13 DIAGNOSIS — D509 Iron deficiency anemia, unspecified: Secondary | ICD-10-CM

## 2016-06-18 ENCOUNTER — Telehealth: Payer: Self-pay | Admitting: *Deleted

## 2016-06-18 NOTE — Telephone Encounter (Signed)
Called patient and left a message for the patient to call the clinic for lab results and possible iron infusion.

## 2016-06-18 NOTE — Telephone Encounter (Signed)
Called patient and she reports that she does not want to get venofer at this time, she will wait until her next appointment.

## 2016-06-19 ENCOUNTER — Inpatient Hospital Stay: Payer: Medicare Other | Attending: Hematology and Oncology

## 2016-06-23 ENCOUNTER — Inpatient Hospital Stay: Payer: Medicare Other

## 2016-07-09 DIAGNOSIS — Z794 Long term (current) use of insulin: Secondary | ICD-10-CM

## 2016-07-09 DIAGNOSIS — E1121 Type 2 diabetes mellitus with diabetic nephropathy: Secondary | ICD-10-CM | POA: Insufficient documentation

## 2016-07-14 ENCOUNTER — Inpatient Hospital Stay: Payer: Medicare Other

## 2016-07-15 ENCOUNTER — Inpatient Hospital Stay: Payer: Medicare Other | Admitting: Hematology and Oncology

## 2016-07-15 ENCOUNTER — Inpatient Hospital Stay: Payer: Medicare Other

## 2016-10-09 DIAGNOSIS — M1990 Unspecified osteoarthritis, unspecified site: Secondary | ICD-10-CM | POA: Insufficient documentation

## 2016-10-09 DIAGNOSIS — M65332 Trigger finger, left middle finger: Secondary | ICD-10-CM | POA: Insufficient documentation

## 2016-10-23 ENCOUNTER — Ambulatory Visit: Admit: 2016-10-23 | Payer: Medicare Other | Admitting: Ophthalmology

## 2016-10-23 SURGERY — PHACOEMULSIFICATION, CATARACT, WITH IOL INSERTION
Anesthesia: Choice | Laterality: Right

## 2016-11-12 DIAGNOSIS — K582 Mixed irritable bowel syndrome: Secondary | ICD-10-CM | POA: Insufficient documentation

## 2016-11-14 ENCOUNTER — Other Ambulatory Visit
Admission: RE | Admit: 2016-11-14 | Discharge: 2016-11-14 | Disposition: A | Discharge status: Home / Self Care | Payer: Medicare Other | Source: Ambulatory Visit | Attending: Ophthalmology | Admitting: Ophthalmology

## 2016-11-14 DIAGNOSIS — Z01812 Encounter for preprocedural laboratory examination: Secondary | ICD-10-CM | POA: Insufficient documentation

## 2016-11-14 LAB — MRSA PCR SCREENING: MRSA by PCR: NEGATIVE

## 2016-11-17 LAB — LATEX, IGE: LATEX: 2.55 kU/L — AB

## 2016-11-19 ENCOUNTER — Encounter: Payer: Self-pay | Admitting: *Deleted

## 2016-11-25 ENCOUNTER — Ambulatory Visit: Payer: Medicare Other | Admitting: Anesthesiology

## 2016-11-25 ENCOUNTER — Encounter: Payer: Self-pay | Admitting: *Deleted

## 2016-11-25 ENCOUNTER — Encounter
Admission: RE | Disposition: A | Discharge status: Home / Self Care | Payer: Self-pay | Source: Ambulatory Visit | Attending: Ophthalmology

## 2016-11-25 ENCOUNTER — Ambulatory Visit
Admission: RE | Admit: 2016-11-25 | Discharge: 2016-11-25 | Disposition: A | Discharge status: Home / Self Care | Payer: Medicare Other | Source: Ambulatory Visit | Attending: Ophthalmology | Admitting: Ophthalmology

## 2016-11-25 DIAGNOSIS — K219 Gastro-esophageal reflux disease without esophagitis: Secondary | ICD-10-CM | POA: Insufficient documentation

## 2016-11-25 DIAGNOSIS — Z87891 Personal history of nicotine dependence: Secondary | ICD-10-CM | POA: Insufficient documentation

## 2016-11-25 DIAGNOSIS — Z932 Ileostomy status: Secondary | ICD-10-CM | POA: Diagnosis not present

## 2016-11-25 DIAGNOSIS — J45909 Unspecified asthma, uncomplicated: Secondary | ICD-10-CM | POA: Insufficient documentation

## 2016-11-25 DIAGNOSIS — Z794 Long term (current) use of insulin: Secondary | ICD-10-CM | POA: Insufficient documentation

## 2016-11-25 DIAGNOSIS — F419 Anxiety disorder, unspecified: Secondary | ICD-10-CM | POA: Insufficient documentation

## 2016-11-25 DIAGNOSIS — E1140 Type 2 diabetes mellitus with diabetic neuropathy, unspecified: Secondary | ICD-10-CM | POA: Insufficient documentation

## 2016-11-25 DIAGNOSIS — H2511 Age-related nuclear cataract, right eye: Secondary | ICD-10-CM | POA: Diagnosis present

## 2016-11-25 DIAGNOSIS — Z79899 Other long term (current) drug therapy: Secondary | ICD-10-CM | POA: Insufficient documentation

## 2016-11-25 HISTORY — DX: Adverse effect of unspecified anesthetic, initial encounter: T41.45XA

## 2016-11-25 HISTORY — DX: Pain, unspecified: R52

## 2016-11-25 HISTORY — DX: Other specified postprocedural states: R11.2

## 2016-11-25 HISTORY — DX: Other specified postprocedural states: Z98.890

## 2016-11-25 HISTORY — DX: Other complications of anesthesia, initial encounter: T88.59XA

## 2016-11-25 HISTORY — PX: CATARACT EXTRACTION W/PHACO: SHX586

## 2016-11-25 HISTORY — DX: Nausea: R11.0

## 2016-11-25 LAB — GLUCOSE, CAPILLARY: GLUCOSE-CAPILLARY: 183 mg/dL — AB (ref 65–99)

## 2016-11-25 SURGERY — PHACOEMULSIFICATION, CATARACT, WITH IOL INSERTION
Anesthesia: Monitor Anesthesia Care | Site: Eye | Laterality: Right | Wound class: Clean

## 2016-11-25 MED ORDER — MIDAZOLAM HCL 2 MG/2ML IJ SOLN
INTRAMUSCULAR | Status: AC
  Filled 2016-11-25: qty 2

## 2016-11-25 MED ORDER — EPINEPHRINE PF 1 MG/ML IJ SOLN
INTRAOCULAR | Status: DC | PRN
  Administered 2016-11-25: 1 mL via OPHTHALMIC

## 2016-11-25 MED ORDER — CARBACHOL 0.01 % IO SOLN: INTRAOCULAR | Status: DC | PRN

## 2016-11-25 MED ORDER — LIDOCAINE HCL (PF) 4 % IJ SOLN
INTRAOCULAR | Status: DC | PRN
  Administered 2016-11-25: 2 mL via OPHTHALMIC

## 2016-11-25 MED ORDER — ARMC OPHTHALMIC DILATING DROPS
1.0000 "application " | OPHTHALMIC | Status: AC
  Administered 2016-11-25 (×3): 1 via OPHTHALMIC

## 2016-11-25 MED ORDER — NA CHONDROIT SULF-NA HYALURON 40-17 MG/ML IO SOLN
INTRAOCULAR | Status: DC | PRN
  Administered 2016-11-25: 1 mL via INTRAOCULAR

## 2016-11-25 MED ORDER — SODIUM CHLORIDE 0.9 % IV SOLN
INTRAVENOUS | Status: DC
  Administered 2016-11-25: 09:00:00 via INTRAVENOUS

## 2016-11-25 MED ORDER — MOXIFLOXACIN HCL 0.5 % OP SOLN: 1.0000 [drp] | OPHTHALMIC | Status: DC | PRN

## 2016-11-25 MED ORDER — ARMC OPHTHALMIC DILATING DROPS
OPHTHALMIC | Status: AC
  Filled 2016-11-25: qty 0.4

## 2016-11-25 MED ORDER — MOXIFLOXACIN HCL 0.5 % OP SOLN
OPHTHALMIC | Status: DC | PRN
  Administered 2016-11-25: .2 mL via OPHTHALMIC

## 2016-11-25 MED ORDER — POVIDONE-IODINE 5 % OP SOLN
OPHTHALMIC | Status: DC | PRN
  Administered 2016-11-25: 1 via OPHTHALMIC

## 2016-11-25 MED ORDER — MIDAZOLAM HCL 2 MG/2ML IJ SOLN
INTRAMUSCULAR | Status: DC | PRN
  Administered 2016-11-25: 2 mg via INTRAVENOUS
  Administered 2016-11-25: 1 mg via INTRAVENOUS

## 2016-11-25 MED ORDER — MOXIFLOXACIN HCL 0.5 % OP SOLN
OPHTHALMIC | Status: AC
  Filled 2016-11-25: qty 3

## 2016-11-25 SURGICAL SUPPLY — 16 items
GLOVE BIO SURGEON STRL SZ8 (GLOVE) ×3 IMPLANT
GLOVE BIOGEL M 6.5 STRL (GLOVE) ×3 IMPLANT
GLOVE SURG LX 8.0 MICRO (GLOVE) ×2
GLOVE SURG LX STRL 8.0 MICRO (GLOVE) ×1 IMPLANT
GOWN STRL REUS W/ TWL LRG LVL3 (GOWN DISPOSABLE) ×2 IMPLANT
GOWN STRL REUS W/TWL LRG LVL3 (GOWN DISPOSABLE) ×4
LABEL CATARACT MEDS ST (LABEL) ×3 IMPLANT
LENS IOL TECNIS ITEC 11.5 (Intraocular Lens) ×3 IMPLANT
PACK CATARACT (MISCELLANEOUS) ×3 IMPLANT
PACK CATARACT BRASINGTON LX (MISCELLANEOUS) ×3 IMPLANT
PACK EYE AFTER SURG (MISCELLANEOUS) ×3 IMPLANT
SOL BSS BAG (MISCELLANEOUS) ×3
SOLUTION BSS BAG (MISCELLANEOUS) ×1 IMPLANT
SYR 5ML LL (SYRINGE) ×3 IMPLANT
WATER STERILE IRR 250ML POUR (IV SOLUTION) ×3 IMPLANT
WIPE NON LINTING 3.25X3.25 (MISCELLANEOUS) ×3 IMPLANT

## 2016-11-25 NOTE — Anesthesia Postprocedure Evaluation (Signed)
Anesthesia Post Note  Patient: EMSLEE Hughes  Procedure(s) Performed: Procedure(s) (LRB): CATARACT EXTRACTION PHACO AND INTRAOCULAR LENS PLACEMENT (IOC) (Right)  Patient location during evaluation: Short Stay Anesthesia Type: MAC Level of consciousness: awake and alert Pain management: pain level controlled Vital Signs Assessment: post-procedure vital signs reviewed and stable Respiratory status: spontaneous breathing, nonlabored ventilation, respiratory function stable and patient connected to nasal cannula oxygen Cardiovascular status: stable and blood pressure returned to baseline Anesthetic complications: no     Last Vitals:  Vitals:   11/25/16 1033 11/25/16 1035  BP: 113/68 113/68  Pulse: 92 92  Resp: 16 12  Temp:  36.4 C    Last Pain:  Vitals:   11/25/16 1035  TempSrc: Oral                 [name]

## 2016-11-25 NOTE — Op Note (Signed)
PREOPERATIVE DIAGNOSIS:  Nuclear sclerotic cataract of the right eye.   POSTOPERATIVE DIAGNOSIS:  nuclear sclerotic cataract right eye   OPERATIVE PROCEDURE: Procedure(s): CATARACT EXTRACTION PHACO AND INTRAOCULAR LENS PLACEMENT (IOC)   SURGEON:  Birder Robson, MD.   ANESTHESIA:  Anesthesiologist: Piscitello, Precious Haws, MD CRNA: Aline Brochure, CRNA  1.      Managed anesthesia care. 2.      0.32m of Shugarcaine was instilled in the eye following the paracentesis.   COMPLICATIONS:  None.   TECHNIQUE:   Stop and chop   DESCRIPTION OF PROCEDURE:  The patient was examined and consented in the preoperative holding area where the aforementioned topical anesthesia was applied to the right eye and then brought back to the Operating Room where the right eye was prepped and draped in the usual sterile ophthalmic fashion and a lid speculum was placed. A paracentesis was created with the side port blade and the anterior chamber was filled with viscoelastic. A near clear corneal incision was performed with the steel keratome. A continuous curvilinear capsulorrhexis was performed with a cystotome followed by the capsulorrhexis forceps. Hydrodissection and hydrodelineation were carried out with BSS on a blunt cannula. The lens was removed in a stop and chop  technique and the remaining cortical material was removed with the irrigation-aspiration handpiece. The capsular bag was inflated with viscoelastic and the Technis [id number]  lens was placed in the capsular bag without complication. The remaining viscoelastic was removed from the eye with the irrigation-aspiration handpiece. The wounds were hydrated. The anterior chamber was flushed with Miostat and the eye was inflated to physiologic pressure. 0.113mof Vigamox was placed in the anterior chamber. The wounds were found to be water tight. The eye was dressed with Vigamox. The patient was given protective glasses to wear throughout the day and a shield with  which to sleep tonight. The patient was also given drops with which to begin a drop regimen today and will follow-up with me in one day.  Implant Name Type Inv. Item Serial No. Manufacturer Lot No. LRB No. Used  LENS IOL DIOP 11.5 - [id number] Intraocular Lens LENS IOL DIOP 11.5 [id number]   Right 1   Procedure(s) with comments: CATARACT EXTRACTION PHACO AND INTRAOCULAR LENS PLACEMENT (IOC) (Right) - [place]:40 AP% 17.4 CDE 6.94 Fluid pack lot # [id number]  Electronically signed: [place]/10/2016 10:33 AM

## 2016-11-25 NOTE — H&P (Signed)
All labs reviewed. Abnormal studies sent to patients PCP when indicated.  Previous H&P reviewed, patient examined, there are NO CHANGES.  Ghislaine Harcum LOUIS8/7/201810:03 AM

## 2016-11-25 NOTE — Discharge Instructions (Signed)
Eye Surgery Discharge Instructions  Expect mild scratchy sensation or mild soreness. DO NOT RUB YOUR EYE!  The day of surgery:  Minimal physical activity, but bed rest is not required  No reading, computer work, or close hand work  No bending, lifting, or straining.  May watch TV  For 24 hours:  No driving, legal decisions, or alcoholic beverages  Safety precautions  Eat anything you prefer: It is better to start with liquids, then soup then solid foods.  _____ Eye patch should be worn until postoperative exam tomorrow.  ____ Solar shield eyeglasses should be worn for comfort in the sunlight/patch while sleeping  Resume all regular medications including aspirin or Coumadin if these were discontinued prior to surgery. You may shower, bathe, shave, or wash your hair. Tylenol may be taken for mild discomfort.  Call your doctor if you experience significant pain, nausea, or vomiting, fever > 101 or other signs of infection. [phone] or [phone] Specific instructions:  Follow-up Information    Birder Robson, MD Follow up.   Specialty:  Ophthalmology Why:  August 8 at 8:50am Contact information: [address] [place] [address] [phone]

## 2016-11-25 NOTE — Transfer of Care (Signed)
Immediate Anesthesia Transfer of Care Note  Patient: Caroline Hughes  Procedure(s) Performed: Procedure(s) with comments: CATARACT EXTRACTION PHACO AND INTRAOCULAR LENS PLACEMENT (IOC) (Right) - [place] 00:40 AP% 17.4 CDE 6.94 Fluid pack lot # [id number]  Patient Location: Short Stay  Anesthesia Type:MAC  Level of Consciousness: awake, alert  and oriented  Airway & Oxygen Therapy: Patient Spontanous Breathing  Post-op Assessment: Post -op Vital signs reviewed and stable  Post vital signs: stable  Last Vitals:  Vitals:   11/25/16 0911 11/25/16 1035  BP: 131/72 113/68  Pulse: 95 92  Resp: 16 12  Temp: 36.8 C 36.4 C    Last Pain:  Vitals:   11/25/16 1035  TempSrc: Oral         Complications: No apparent anesthesia complications

## 2016-11-25 NOTE — Anesthesia Preprocedure Evaluation (Signed)
Anesthesia Evaluation  Patient identified by MRN, date of birth, ID band Patient awake    Reviewed: Allergy & Precautions, H&P , NPO status , Patient's Chart, lab work & pertinent test results  History of Anesthesia Complications (+) PONV and history of anesthetic complications  Airway Mallampati: III  TM Distance: <3 FB Neck ROM: limited    Dental  (+) Poor Dentition, Chipped   Pulmonary asthma , former smoker,           Cardiovascular Exercise Tolerance: Good      Neuro/Psych  Headaches, PSYCHIATRIC DISORDERS Anxiety Depression    GI/Hepatic Neg liver ROS, PUD, GERD  Controlled and Medicated,  Endo/Other  diabetes, Type 2  Renal/GU      Musculoskeletal  (+) Arthritis , Fibromyalgia -  Abdominal   Peds  Hematology negative hematology ROS (+)   Anesthesia Other Findings Past Medical History: No date: Anemia     Comment:  Iron deficiency IRON INFUSIONS X 5 WEEKS No date: Anxiety No date: Arthritis No date: Asthma No date: Complication of anesthesia No date: Crohn's disease ([place]) No date: Depression No date: Diabetes mellitus without complication ([facility]) No date: Endometriosis No date: Fibromyalgia No date: Gastric ulcer No date: GERD (gastroesophageal reflux disease) No date: Herniated disc, cervical No date: Hyperlipemia No date: Intestinal obstruction ([facility]) No date: Migraine     Comment:  CHRONIC No date: Nausea     Comment:  CHRONIC No date: Pain     Comment:  BACK No date: Peristomal hernia No date: PONV (postoperative nausea and vomiting) No date: Ulcerative colitis ([place])  Past Surgical History: No date: ABDOMINAL HYSTERECTOMY No date: CHOLECYSTECTOMY No date: HERNIA REPAIR     Comment:  MULTIPLE PERISTOMAL No date: ILEOSTOMY; Bilateral     Comment:  MOVED X 3 No date: rectal fistula packing     Comment:  X 3  BMI    Body Mass Index:  26.52 kg/m       Reproductive/Obstetrics negative OB ROS                            Anesthesia Physical Anesthesia Plan  ASA: III  Anesthesia Plan: MAC   Post-op Pain Management:    Induction: Intravenous  PONV Risk Score and Plan:   Airway Management Planned: Natural Airway and Nasal Cannula  Additional Equipment:   Intra-op Plan:   Post-operative Plan:   Informed Consent: I have reviewed the patients History and Physical, chart, labs and discussed the procedure including the risks, benefits and alternatives for the proposed anesthesia with the patient or authorized representative who has indicated his/her understanding and acceptance.   Dental Advisory Given  Plan Discussed with: Anesthesiologist, CRNA and Surgeon  Anesthesia Plan Comments: (Patient consented for risks of anesthesia including but not limited to:  - adverse reactions to medications - damage to teeth, lips or other oral mucosa - sore throat or hoarseness - Damage to heart, brain, lungs or loss of life  Patient voiced understanding.)        Anesthesia Quick Evaluation

## 2016-11-25 NOTE — Anesthesia Post-op Follow-up Note (Signed)
Anesthesia QCDR form completed.        

## 2016-12-02 ENCOUNTER — Encounter
Admission: RE | Disposition: A | Discharge status: Home / Self Care | Payer: Self-pay | Source: Ambulatory Visit | Attending: Ophthalmology

## 2016-12-02 ENCOUNTER — Ambulatory Visit: Payer: Medicare Other | Admitting: Anesthesiology

## 2016-12-02 ENCOUNTER — Ambulatory Visit
Admission: RE | Admit: 2016-12-02 | Discharge: 2016-12-02 | Disposition: A | Discharge status: Home / Self Care | Payer: Medicare Other | Source: Ambulatory Visit | Attending: Ophthalmology | Admitting: Ophthalmology

## 2016-12-02 ENCOUNTER — Encounter: Payer: Self-pay | Admitting: *Deleted

## 2016-12-02 DIAGNOSIS — Z794 Long term (current) use of insulin: Secondary | ICD-10-CM | POA: Insufficient documentation

## 2016-12-02 DIAGNOSIS — E1140 Type 2 diabetes mellitus with diabetic neuropathy, unspecified: Secondary | ICD-10-CM | POA: Insufficient documentation

## 2016-12-02 DIAGNOSIS — Z881 Allergy status to other antibiotic agents status: Secondary | ICD-10-CM | POA: Insufficient documentation

## 2016-12-02 DIAGNOSIS — K219 Gastro-esophageal reflux disease without esophagitis: Secondary | ICD-10-CM | POA: Insufficient documentation

## 2016-12-02 DIAGNOSIS — Z932 Ileostomy status: Secondary | ICD-10-CM | POA: Diagnosis not present

## 2016-12-02 DIAGNOSIS — Z880 Allergy status to penicillin: Secondary | ICD-10-CM | POA: Insufficient documentation

## 2016-12-02 DIAGNOSIS — Z91040 Latex allergy status: Secondary | ICD-10-CM | POA: Diagnosis not present

## 2016-12-02 DIAGNOSIS — Z79899 Other long term (current) drug therapy: Secondary | ICD-10-CM | POA: Diagnosis not present

## 2016-12-02 DIAGNOSIS — K5090 Crohn's disease, unspecified, without complications: Secondary | ICD-10-CM | POA: Insufficient documentation

## 2016-12-02 DIAGNOSIS — Z9889 Other specified postprocedural states: Secondary | ICD-10-CM | POA: Insufficient documentation

## 2016-12-02 DIAGNOSIS — Z8719 Personal history of other diseases of the digestive system: Secondary | ICD-10-CM | POA: Diagnosis not present

## 2016-12-02 DIAGNOSIS — H2512 Age-related nuclear cataract, left eye: Secondary | ICD-10-CM | POA: Insufficient documentation

## 2016-12-02 DIAGNOSIS — Z888 Allergy status to other drugs, medicaments and biological substances status: Secondary | ICD-10-CM | POA: Diagnosis not present

## 2016-12-02 DIAGNOSIS — Z90710 Acquired absence of both cervix and uterus: Secondary | ICD-10-CM | POA: Diagnosis not present

## 2016-12-02 DIAGNOSIS — G43909 Migraine, unspecified, not intractable, without status migrainosus: Secondary | ICD-10-CM | POA: Diagnosis not present

## 2016-12-02 DIAGNOSIS — Z87891 Personal history of nicotine dependence: Secondary | ICD-10-CM | POA: Insufficient documentation

## 2016-12-02 DIAGNOSIS — M797 Fibromyalgia: Secondary | ICD-10-CM | POA: Insufficient documentation

## 2016-12-02 DIAGNOSIS — J45909 Unspecified asthma, uncomplicated: Secondary | ICD-10-CM | POA: Insufficient documentation

## 2016-12-02 DIAGNOSIS — Z882 Allergy status to sulfonamides status: Secondary | ICD-10-CM | POA: Diagnosis not present

## 2016-12-02 DIAGNOSIS — Z8614 Personal history of Methicillin resistant Staphylococcus aureus infection: Secondary | ICD-10-CM | POA: Insufficient documentation

## 2016-12-02 HISTORY — DX: Bronchitis, not specified as acute or chronic: J40

## 2016-12-02 HISTORY — PX: CATARACT EXTRACTION W/PHACO: SHX586

## 2016-12-02 HISTORY — DX: Polyneuropathy, unspecified: G62.9

## 2016-12-02 LAB — GLUCOSE, CAPILLARY: Glucose-Capillary: 183 mg/dL — ABNORMAL HIGH (ref 65–99)

## 2016-12-02 SURGERY — PHACOEMULSIFICATION, CATARACT, WITH IOL INSERTION
Anesthesia: Monitor Anesthesia Care | Site: Eye | Laterality: Left | Wound class: Clean

## 2016-12-02 MED ORDER — MIDAZOLAM HCL 2 MG/2ML IJ SOLN
INTRAMUSCULAR | Status: AC
  Filled 2016-12-02: qty 2

## 2016-12-02 MED ORDER — POVIDONE-IODINE 5 % OP SOLN
OPHTHALMIC | Status: AC
  Filled 2016-12-02: qty 30

## 2016-12-02 MED ORDER — EPINEPHRINE PF 1 MG/ML IJ SOLN
INTRAMUSCULAR | Status: DC | PRN
  Administered 2016-12-02: 1 mL via OPHTHALMIC

## 2016-12-02 MED ORDER — MOXIFLOXACIN HCL 0.5 % OP SOLN
OPHTHALMIC | Status: DC | PRN
  Administered 2016-12-02: .2 mL via OPHTHALMIC

## 2016-12-02 MED ORDER — LIDOCAINE HCL (PF) 4 % IJ SOLN
INTRAMUSCULAR | Status: DC | PRN
  Administered 2016-12-02: 2 mL via OPHTHALMIC

## 2016-12-02 MED ORDER — MOXIFLOXACIN HCL 0.5 % OP SOLN
OPHTHALMIC | Status: AC
  Filled 2016-12-02: qty 3

## 2016-12-02 MED ORDER — MOXIFLOXACIN HCL 0.5 % OP SOLN
1.0000 [drp] | OPHTHALMIC | Status: DC | PRN
  Filled 2016-12-02: qty 3

## 2016-12-02 MED ORDER — FENTANYL CITRATE (PF) 100 MCG/2ML IJ SOLN
INTRAMUSCULAR | Status: AC
  Filled 2016-12-02: qty 2

## 2016-12-02 MED ORDER — SODIUM CHLORIDE 0.9 % IV SOLN
INTRAVENOUS | Status: DC
  Administered 2016-12-02: 10:00:00 via INTRAVENOUS

## 2016-12-02 MED ORDER — NA CHONDROIT SULF-NA HYALURON 40-17 MG/ML IO SOLN
INTRAOCULAR | Status: DC | PRN
  Administered 2016-12-02: 1 mL via INTRAOCULAR

## 2016-12-02 MED ORDER — NA CHONDROIT SULF-NA HYALURON 40-17 MG/ML IO SOLN
INTRAOCULAR | Status: AC
  Filled 2016-12-02: qty 1

## 2016-12-02 MED ORDER — MIDAZOLAM HCL 2 MG/2ML IJ SOLN
INTRAMUSCULAR | Status: DC | PRN
  Administered 2016-12-02: 2 mg via INTRAVENOUS

## 2016-12-02 MED ORDER — FENTANYL CITRATE (PF) 100 MCG/2ML IJ SOLN
INTRAMUSCULAR | Status: DC | PRN
  Administered 2016-12-02 (×2): 50 ug via INTRAVENOUS

## 2016-12-02 MED ORDER — ARMC OPHTHALMIC DILATING DROPS
OPHTHALMIC | Status: AC
  Administered 2016-12-02: 11:00:00
  Filled 2016-12-02: qty 0.4

## 2016-12-02 MED ORDER — ARMC OPHTHALMIC DILATING DROPS
1.0000 "application " | OPHTHALMIC | Status: AC
  Administered 2016-12-02 (×2): 1 via OPHTHALMIC

## 2016-12-02 MED ORDER — POVIDONE-IODINE 5 % OP SOLN
OPHTHALMIC | Status: DC | PRN
  Administered 2016-12-02: 1 via OPHTHALMIC

## 2016-12-02 MED ORDER — EPINEPHRINE PF 1 MG/ML IJ SOLN
INTRAMUSCULAR | Status: AC
  Filled 2016-12-02: qty 1

## 2016-12-02 MED ORDER — LIDOCAINE HCL (PF) 4 % IJ SOLN
INTRAMUSCULAR | Status: AC
  Filled 2016-12-02: qty 5

## 2016-12-02 SURGICAL SUPPLY — 16 items
GLOVE BIO SURGEON STRL SZ8 (GLOVE) ×3 IMPLANT
GLOVE BIOGEL M 6.5 STRL (GLOVE) ×3 IMPLANT
GLOVE SURG LX 8.0 MICRO (GLOVE)
GLOVE SURG LX STRL 8.0 MICRO (GLOVE) IMPLANT
GOWN STRL REUS W/ TWL LRG LVL3 (GOWN DISPOSABLE) ×2 IMPLANT
GOWN STRL REUS W/TWL LRG LVL3 (GOWN DISPOSABLE) ×4
LABEL CATARACT MEDS ST (LABEL) ×3 IMPLANT
LENS IOL TECNIS ITEC 11.5 (Intraocular Lens) ×3 IMPLANT
PACK CATARACT (MISCELLANEOUS) ×3 IMPLANT
PACK CATARACT BRASINGTON LX (MISCELLANEOUS) ×3 IMPLANT
PACK EYE AFTER SURG (MISCELLANEOUS) ×3 IMPLANT
SOL BSS BAG (MISCELLANEOUS) ×3
SOLUTION BSS BAG (MISCELLANEOUS) ×1 IMPLANT
SYR 5ML LL (SYRINGE) ×3 IMPLANT
WATER STERILE IRR 250ML POUR (IV SOLUTION) ×3 IMPLANT
WIPE NON LINTING 3.25X3.25 (MISCELLANEOUS) ×3 IMPLANT

## 2016-12-02 NOTE — Anesthesia Preprocedure Evaluation (Signed)
Anesthesia Evaluation  Patient identified by MRN, date of birth, ID band Patient awake    Reviewed: Allergy & Precautions, NPO status , Patient's Chart, lab work & pertinent test results  History of Anesthesia Complications (+) PONV  Airway Mallampati: III       Dental  (+) Chipped   Pulmonary asthma , former smoker,           Cardiovascular negative cardio ROS       Neuro/Psych Anxiety Depression    GI/Hepatic Neg liver ROS, PUD, GERD  Medicated,  Endo/Other  diabetes, Type 2, Oral Hypoglycemic Agents  Renal/GU negative Renal ROS     Musculoskeletal   Abdominal   Peds  Hematology   Anesthesia Other Findings   Reproductive/Obstetrics                             Anesthesia Physical Anesthesia Plan  ASA: III  Anesthesia Plan: MAC   Post-op Pain Management:    Induction:   PONV Risk Score and Plan:   Airway Management Planned:   Additional Equipment:   Intra-op Plan:   Post-operative Plan:   Informed Consent: I have reviewed the patients History and Physical, chart, labs and discussed the procedure including the risks, benefits and alternatives for the proposed anesthesia with the patient or authorized representative who has indicated his/her understanding and acceptance.     Plan Discussed with:   Anesthesia Plan Comments:         Anesthesia Quick Evaluation

## 2016-12-02 NOTE — H&P (Signed)
All labs reviewed. Abnormal studies sent to patients PCP when indicated.  Previous H&P reviewed, patient examined, there are NO CHANGES.  Caroline Hughes LOUIS8/14/201811:20 AM

## 2016-12-02 NOTE — Transfer of Care (Signed)
Immediate Anesthesia Transfer of Care Note  Patient: Caroline Hughes  Procedure(s) Performed: Procedure(s) with comments: CATARACT EXTRACTION PHACO AND INTRAOCULAR LENS PLACEMENT (IOC) (Left) - [place] 00:34 AP% 14.2 CDE 4.94 Flyuid pack lot [id number]  Patient Location: PACU  Anesthesia Type:MAC  Level of Consciousness: awake, alert  and oriented  Airway & Oxygen Therapy: Patient Spontanous Breathing  Post-op Assessment: Report given to RN and Post -op Vital signs reviewed and stable  Post vital signs: Reviewed and stable  Last Vitals:  Vitals:   12/02/16 0952 12/02/16 1145  BP: 131/73 124/77  Pulse: 93 86  Resp: 16 16  Temp: 36.6 C   SpO2: 97% 97%    Last Pain:  Vitals:   12/02/16 1145  TempSrc: Oral  PainSc: 0-No pain         Complications: No apparent anesthesia complications

## 2016-12-02 NOTE — Anesthesia Procedure Notes (Signed)
Procedure Name: MAC Date/Time: 12/02/2016 11:25 AM Performed by: Darlyne Russian Pre-anesthesia Checklist: Patient identified, Emergency Drugs available, Suction available, Patient being monitored and Timeout performed Patient Re-evaluated:Patient Re-evaluated prior to induction Oxygen Delivery Method: Nasal cannula Preoxygenation: Pre-oxygenation with 100% oxygen Placement Confirmation: positive ETCO2

## 2016-12-02 NOTE — Anesthesia Postprocedure Evaluation (Signed)
Anesthesia Post Note  Patient: Caroline Hughes  Procedure(s) Performed: Procedure(s) (LRB): CATARACT EXTRACTION PHACO AND INTRAOCULAR LENS PLACEMENT (IOC) (Left)  Patient location during evaluation: PACU Anesthesia Type: MAC Level of consciousness: awake and alert Pain management: pain level controlled Vital Signs Assessment: post-procedure vital signs reviewed and stable Respiratory status: spontaneous breathing, nonlabored ventilation, respiratory function stable and patient connected to nasal cannula oxygen Cardiovascular status: stable and blood pressure returned to baseline Anesthetic complications: no     Last Vitals:  Vitals:   12/02/16 0952 12/02/16 1145  BP: 131/73 124/77  Pulse: 93 86  Resp: 16 16  Temp: 36.6 C   SpO2: 97% 97%    Last Pain:  Vitals:   12/02/16 1145  TempSrc: Oral  PainSc: 0-No pain                 Darlyne Russian

## 2016-12-02 NOTE — Discharge Instructions (Signed)
Eye Surgery Discharge Instructions  Expect mild scratchy sensation or mild soreness. DO NOT RUB YOUR EYE!  The day of surgery:  Minimal physical activity, but bed rest is not required  No reading, computer work, or close hand work  No bending, lifting, or straining.  May watch TV  For 24 hours:  No driving, legal decisions, or alcoholic beverages  Safety precautions  Eat anything you prefer: It is better to start with liquids, then soup then solid foods.  _____ Eye patch should be worn until postoperative exam tomorrow.  ____ Solar shield eyeglasses should be worn for comfort in the sunlight/patch while sleeping  Resume all regular medications including aspirin or Coumadin if these were discontinued prior to surgery. You may shower, bathe, shave, or wash your hair. Tylenol may be taken for mild discomfort.  Call your doctor if you experience significant pain, nausea, or vomiting, fever > 101 or other signs of infection. [phone] or [phone] Specific instructions:  Follow-up Information    Birder Robson, MD Follow up.   Specialty:  Ophthalmology Why:  August [facility] at [facility] information: [address] [place] [address] [phone]

## 2016-12-02 NOTE — Op Note (Signed)
PREOPERATIVE DIAGNOSIS:  Nuclear sclerotic cataract of the left eye.   POSTOPERATIVE DIAGNOSIS:  Nuclear sclerotic cataract of the left eye.   OPERATIVE PROCEDURE: Procedure(s): CATARACT EXTRACTION PHACO AND INTRAOCULAR LENS PLACEMENT (IOC)   SURGEON:  Birder Robson, MD.   ANESTHESIA:  Anesthesiologist: Gunnar Fusi, MD CRNA: Darlyne Russian, CRNA  1.      Managed anesthesia care. 2.     0.77m of Shugarcaine was instilled following the paracentesis   COMPLICATIONS:  None.   TECHNIQUE:   Stop and chop   DESCRIPTION OF PROCEDURE:  The patient was examined and consented in the preoperative holding area where the aforementioned topical anesthesia was applied to the left eye and then brought back to the Operating Room where the left eye was prepped and draped in the usual sterile ophthalmic fashion and a lid speculum was placed. A paracentesis was created with the side port blade and the anterior chamber was filled with viscoelastic. A near clear corneal incision was performed with the steel keratome. A continuous curvilinear capsulorrhexis was performed with a cystotome followed by the capsulorrhexis forceps. Hydrodissection and hydrodelineation were carried out with BSS on a blunt cannula. The lens was removed in a stop and chop  technique and the remaining cortical material was removed with the irrigation-aspiration handpiece. The capsular bag was inflated with viscoelastic and the Technis [id number] lens was placed in the capsular bag without complication. The remaining viscoelastic was removed from the eye with the irrigation-aspiration handpiece. The wounds were hydrated. The anterior chamber was flushed with Miostat and the eye was inflated to physiologic pressure. 0.15mVigamox was placed in the anterior chamber. The wounds were found to be water tight. The eye was dressed with Vigamox. The patient was given protective glasses to wear throughout the day and a shield with which to sleep  tonight. The patient was also given drops with which to begin a drop regimen today and will follow-up with me in one day.  Implant Name Type Inv. Item Serial No. Manufacturer Lot No. LRB No. Used  LENS IOL DIOP 11.5 - [id number] Intraocular Lens LENS IOL DIOP 11.5 [id number] AMO   Left 1    Procedure(s) with comments: CATARACT EXTRACTION PHACO AND INTRAOCULAR LENS PLACEMENT (IOC) (Left) - [place]:34 AP% 14.2 CDE 4.94 Flyuid pack lot [id number]  Electronically signed: POLaurens/14/2018 11:44 AM

## 2016-12-02 NOTE — Anesthesia Post-op Follow-up Note (Signed)
Anesthesia QCDR form completed.        

## 2017-01-21 DIAGNOSIS — M7918 Myalgia, other site: Secondary | ICD-10-CM | POA: Insufficient documentation

## 2017-01-27 IMAGING — CT CT ABD-PELV W/ CM
1 of 3 series · 14 of 32 positions shown, 19 images · IV contrast (omnipaque)
Comparison: 12/22/2013

CLINICAL DATA: Abdominal pain over the past year, much worse today.

EXAM:
CT ABDOMEN AND PELVIS WITH CONTRAST
TECHNIQUE: Multidetector CT imaging of the abdomen and pelvis was performed
using the standard protocol following bolus administration of
intravenous contrast.
CONTRAST:  100mL OMNIPAQUE IOHEXOL 300 MG/ML  SOLN

[Series 2: routine abd pel with · axial · 0.69mm/px · z∈[-466,-106]mm · 14 of 82 slices shown, 19 images]
[im 5/82  soft-tissue]
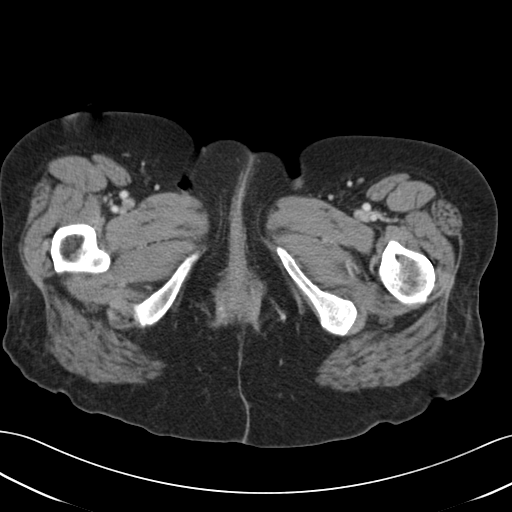
[im 5/82  bone]
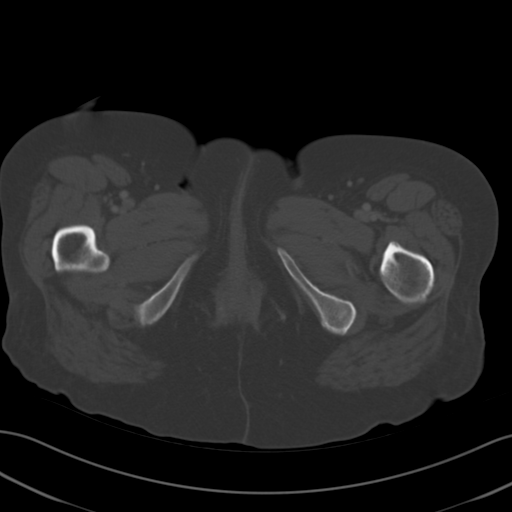
[im 13/82  soft-tissue]
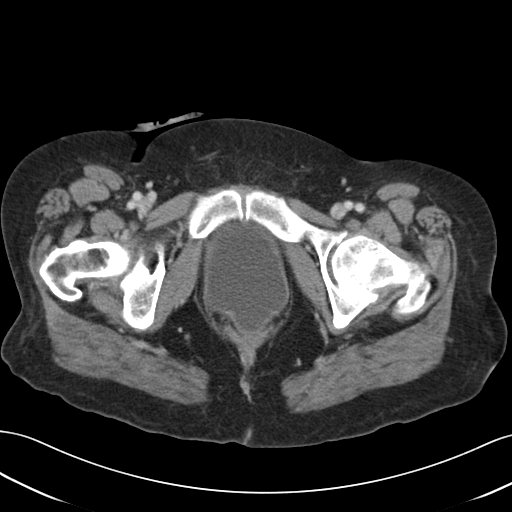
[im 18/82  soft-tissue]
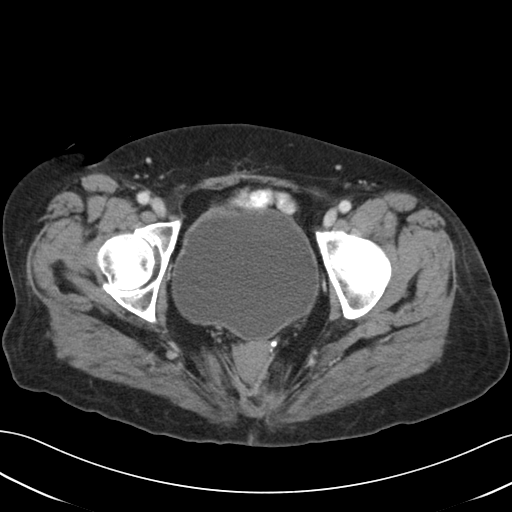
[im 22/82  soft-tissue]
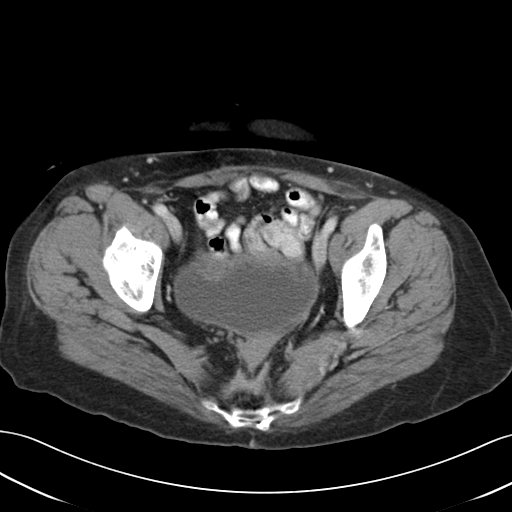
[im 30/82  soft-tissue]
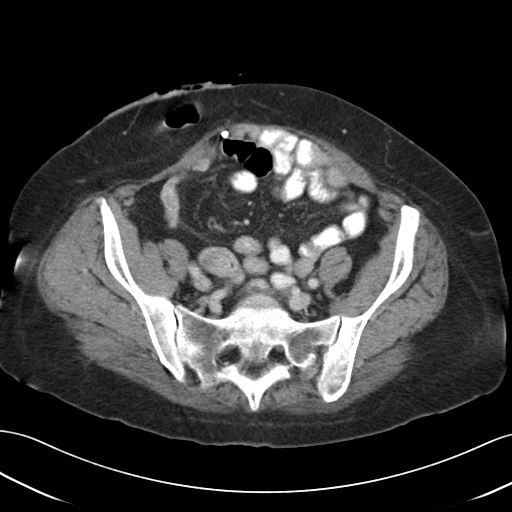
[im 35/82  soft-tissue]
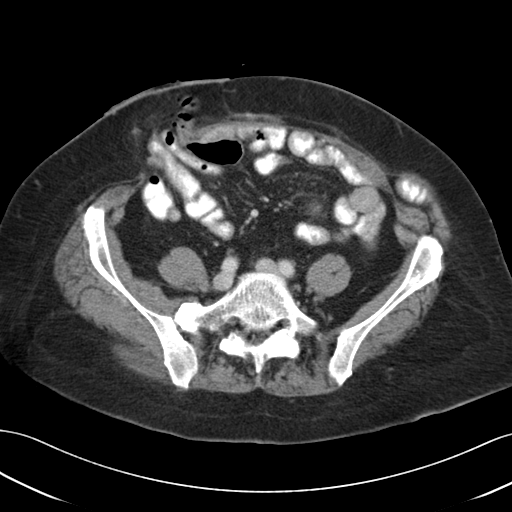
[im 43/82  soft-tissue]
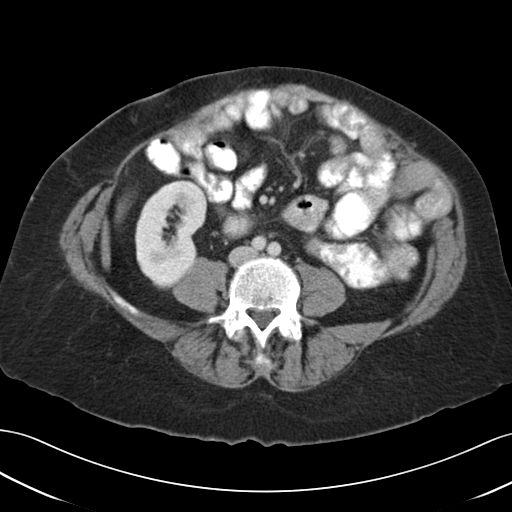
[im 47/82  soft-tissue]
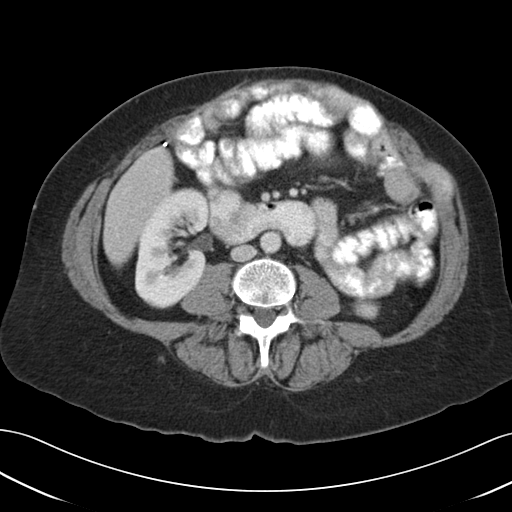
[im 52/82  soft-tissue]
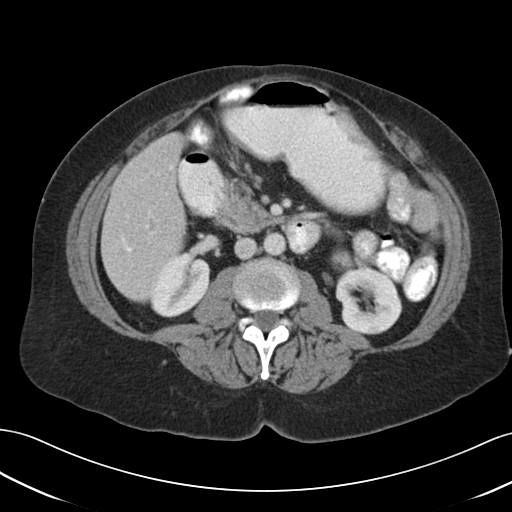
[im 52/82  bone]
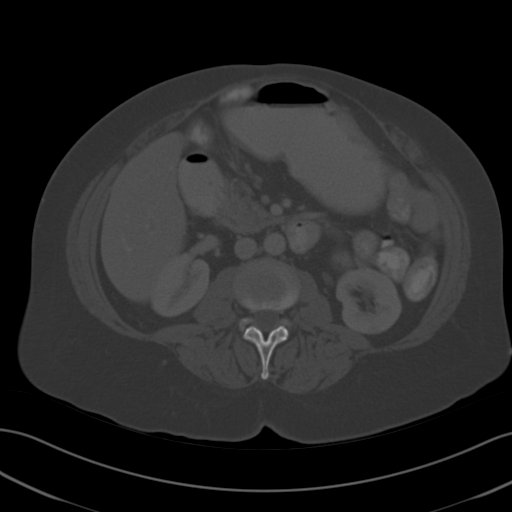
[im 60/82  soft-tissue]
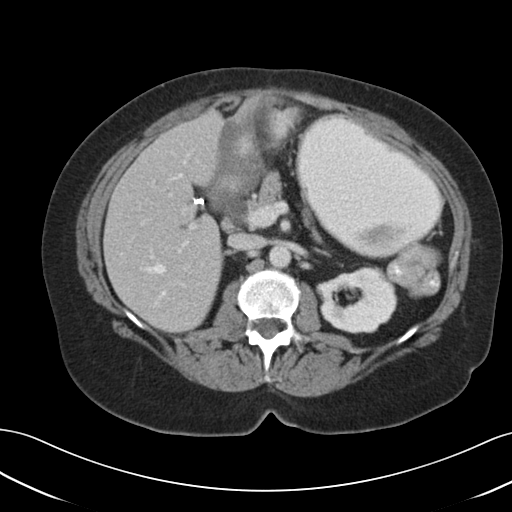
[im 64/82  soft-tissue]
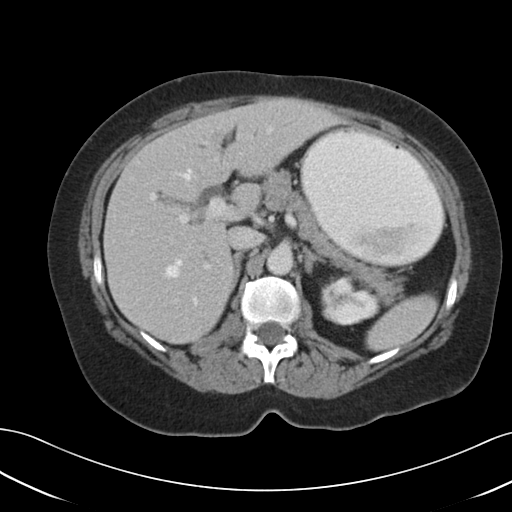
[im 64/82  lung]
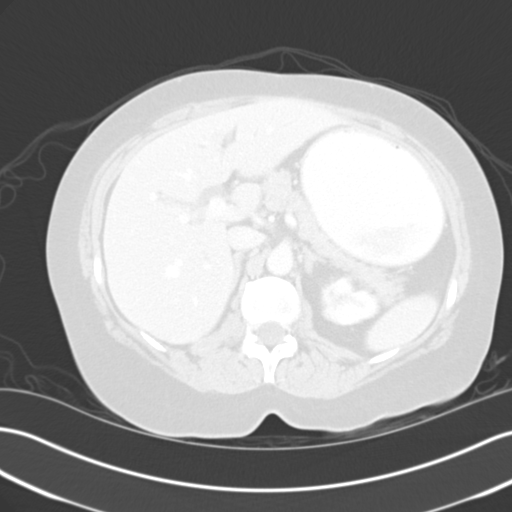
[im 69/82  soft-tissue]
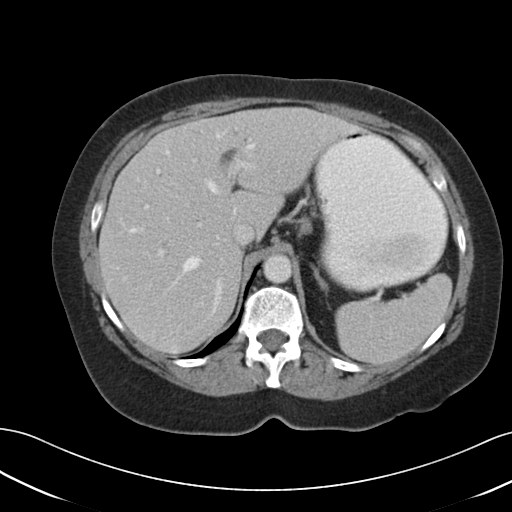
[im 69/82  lung]
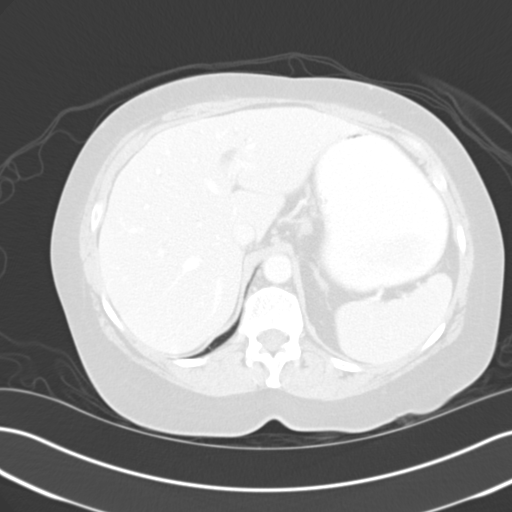
[im 73/82  lung]
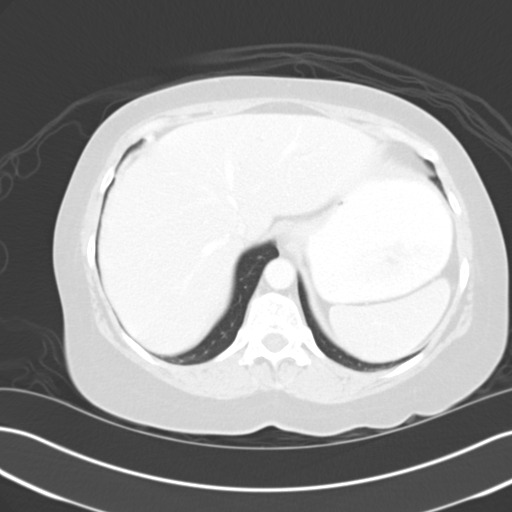
[im 77/82  soft-tissue]
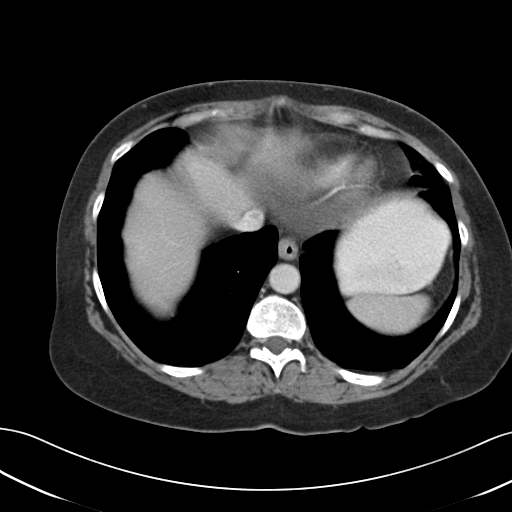
[im 77/82  lung]
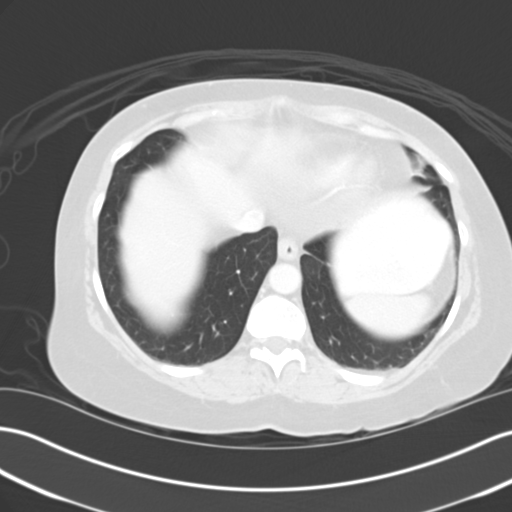

[14 of 32 positions shown; findings below may reference images not displayed]

FINDINGS: There is a peristomal hernia containing a knuckle of unobstructed
small bowel adjacent to the ileostomy. The small bowel is
unremarkable in appearance. There is no evidence of bowel
obstruction. There is no focal inflammatory change in the abdomen or
pelvis. The is prior cholecystectomy. There is mild prominence of
the bile ducts, likely due to post cholecystectomy reservoir effect.
There are normal appearances of the liver, spleen, pancreas,
adrenals and kidneys. The abdominal aorta is normal in caliber
without atherosclerotic calcification. Urinary bladder is
unremarkable. There is no significant abnormality in the lower
chest. There is no significant muscular abnormality.
IMPRESSION: No acute findings. Peristomal hernia containing a knuckle of
unobstructed small bowel at the ileostomy. Mildly prominent caliber
of the biliary system, likely due to post cholecystectomy reservoir
effect.

## 2017-01-27 IMAGING — CR DG ABDOMEN 2V
1 series · 2 of 2 positions shown · non-contrast
Comparison: 12/17/2011

CLINICAL DATA: Mid abdominal pain nausea for past 4 hours. Crohn's
disease.

EXAM:
ABDOMEN - 2 VIEW

[Series 1: dg abd 2 views · 0.14mm/px · 2 of 2 slices shown]
[im 1/2]
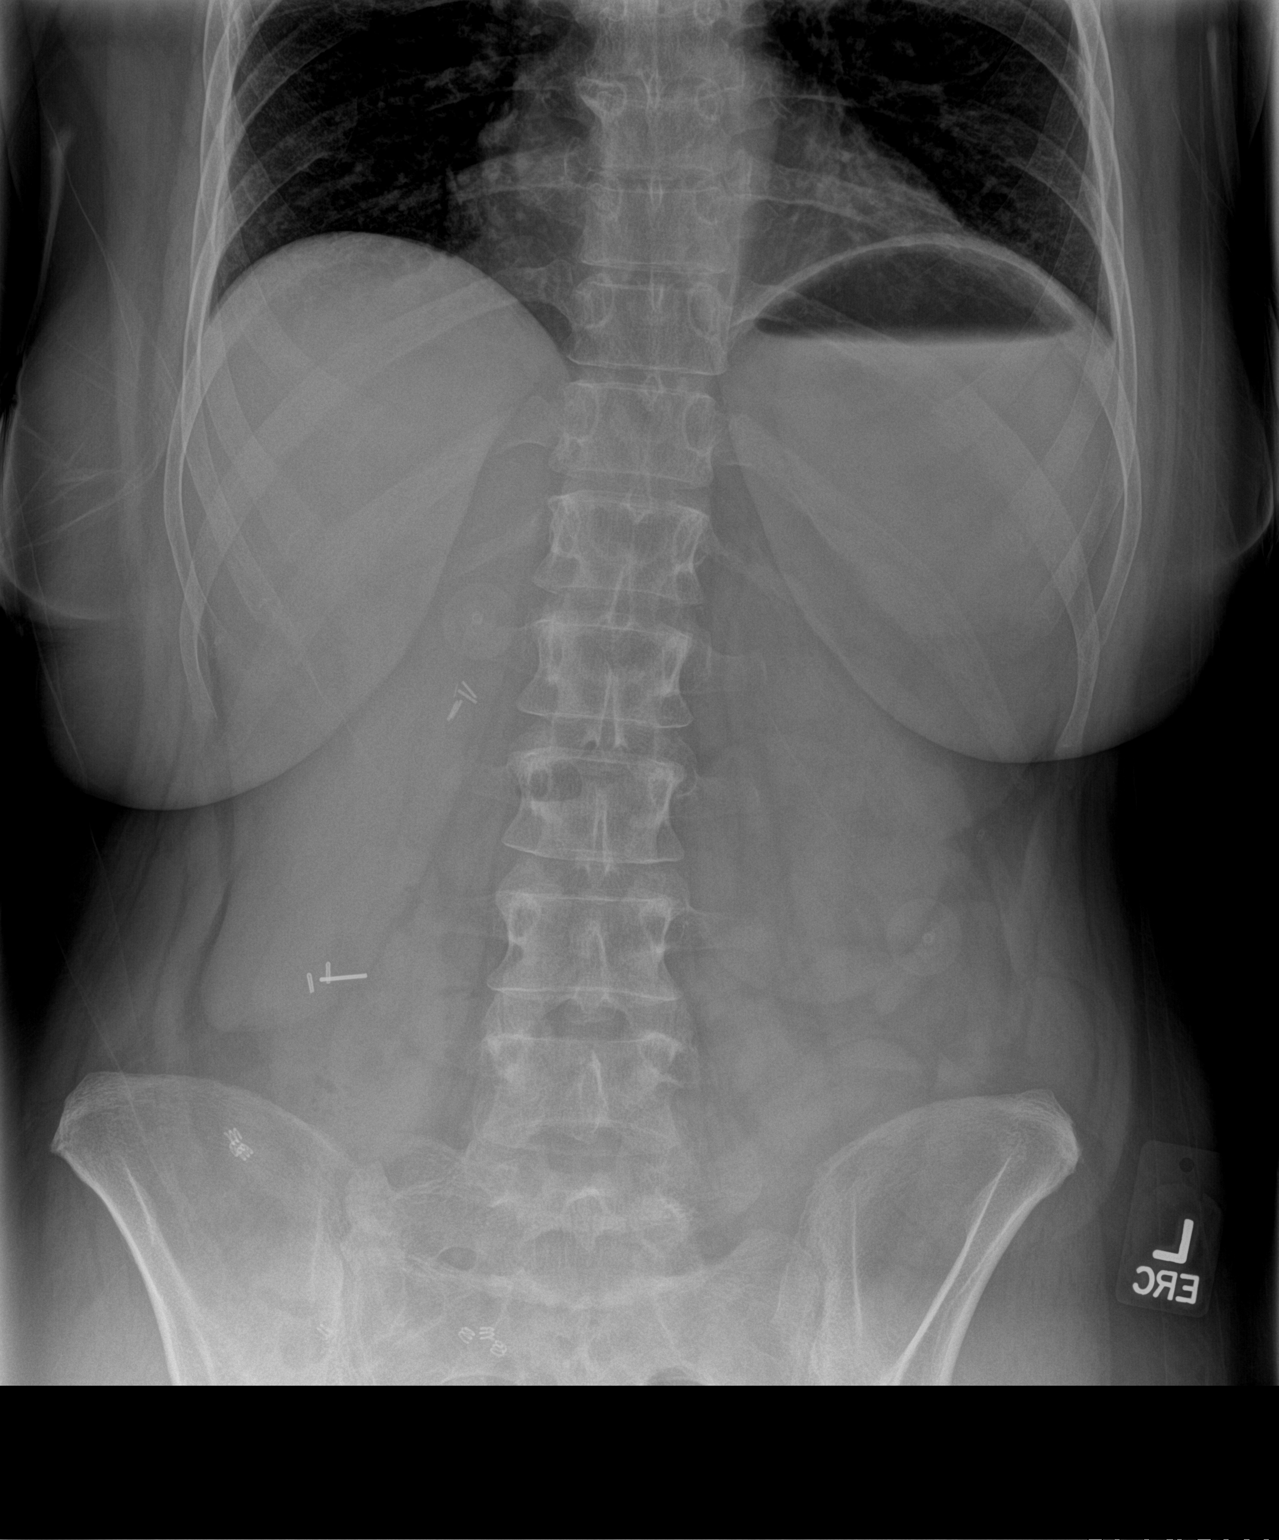
[im 2/2]
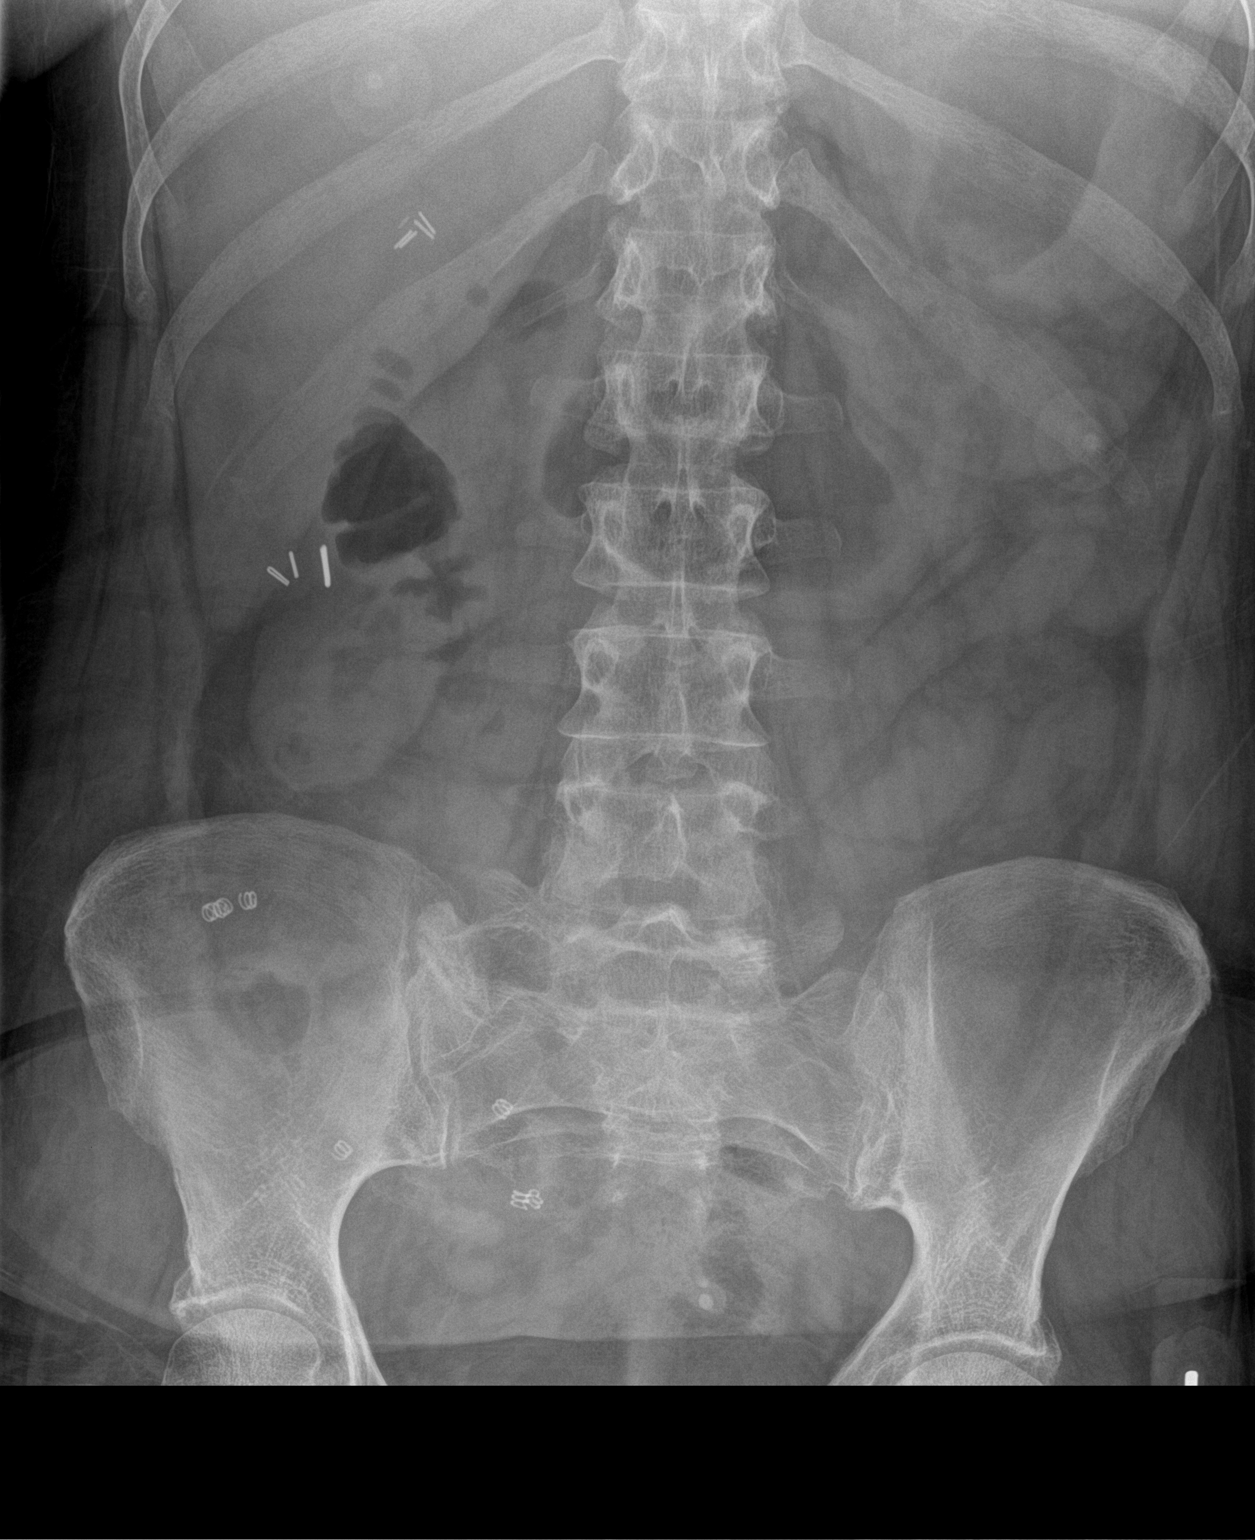

[2 of 2 positions shown; findings below may reference images not displayed]

FINDINGS: The bowel gas pattern is normal. There is no evidence of free air.
No radio-opaque calculi or other significant radiographic
abnormality is seen. Surgical clips and mesh again noted in the
right abdomen.
IMPRESSION: Unremarkable bowel gas pattern.  No acute findings.

## 2017-02-18 ENCOUNTER — Encounter: Payer: Self-pay | Admitting: Student in an Organized Health Care Education/Training Program

## 2017-02-18 ENCOUNTER — Ambulatory Visit
Payer: Medicare Other | Attending: Student in an Organized Health Care Education/Training Program | Admitting: Student in an Organized Health Care Education/Training Program

## 2017-02-18 VITALS — BP 162/80 | HR 96 | Temp 98.3°F | Resp 18 | Ht 62.0 in | Wt 145.0 lb

## 2017-02-18 DIAGNOSIS — K5090 Crohn's disease, unspecified, without complications: Secondary | ICD-10-CM | POA: Insufficient documentation

## 2017-02-18 DIAGNOSIS — G894 Chronic pain syndrome: Secondary | ICD-10-CM | POA: Insufficient documentation

## 2017-02-18 DIAGNOSIS — Z794 Long term (current) use of insulin: Secondary | ICD-10-CM | POA: Insufficient documentation

## 2017-02-18 DIAGNOSIS — E1121 Type 2 diabetes mellitus with diabetic nephropathy: Secondary | ICD-10-CM | POA: Insufficient documentation

## 2017-02-18 DIAGNOSIS — D509 Iron deficiency anemia, unspecified: Secondary | ICD-10-CM | POA: Insufficient documentation

## 2017-02-18 DIAGNOSIS — Z880 Allergy status to penicillin: Secondary | ICD-10-CM | POA: Insufficient documentation

## 2017-02-18 DIAGNOSIS — M4722 Other spondylosis with radiculopathy, cervical region: Secondary | ICD-10-CM | POA: Insufficient documentation

## 2017-02-18 DIAGNOSIS — J45909 Unspecified asthma, uncomplicated: Secondary | ICD-10-CM | POA: Diagnosis not present

## 2017-02-18 DIAGNOSIS — K219 Gastro-esophageal reflux disease without esophagitis: Secondary | ICD-10-CM | POA: Diagnosis not present

## 2017-02-18 DIAGNOSIS — Z881 Allergy status to other antibiotic agents status: Secondary | ICD-10-CM | POA: Insufficient documentation

## 2017-02-18 DIAGNOSIS — F329 Major depressive disorder, single episode, unspecified: Secondary | ICD-10-CM | POA: Diagnosis not present

## 2017-02-18 DIAGNOSIS — Z87891 Personal history of nicotine dependence: Secondary | ICD-10-CM | POA: Diagnosis not present

## 2017-02-18 DIAGNOSIS — Z79899 Other long term (current) drug therapy: Secondary | ICD-10-CM | POA: Insufficient documentation

## 2017-02-18 DIAGNOSIS — M1990 Unspecified osteoarthritis, unspecified site: Secondary | ICD-10-CM | POA: Diagnosis not present

## 2017-02-18 DIAGNOSIS — M47812 Spondylosis without myelopathy or radiculopathy, cervical region: Secondary | ICD-10-CM | POA: Insufficient documentation

## 2017-02-18 DIAGNOSIS — M5030 Other cervical disc degeneration, unspecified cervical region: Secondary | ICD-10-CM | POA: Insufficient documentation

## 2017-02-18 DIAGNOSIS — M797 Fibromyalgia: Secondary | ICD-10-CM | POA: Diagnosis not present

## 2017-02-18 DIAGNOSIS — F419 Anxiety disorder, unspecified: Secondary | ICD-10-CM | POA: Diagnosis not present

## 2017-02-18 NOTE — Progress Notes (Signed)
Safety precautions to be maintained throughout the outpatient stay will include: orient to surroundings, keep bed in low position, maintain call bell within reach at all times, provide assistance with transfer out of bed and ambulation.  

## 2017-02-18 NOTE — Progress Notes (Signed)
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    Constitutional Exam  General appearance: Well nourished, well developed, and well hydrated. In no apparent acute distress Vitals:   02/18/17 1405  BP: (!) 162/80  Pulse: 96  Resp: 18  Temp: 98.3 F (36.8 C)  TempSrc: Oral  SpO2: 100%  Weight: 145 lb (65.8 kg)  Height: 5' 2"  (1.575 m)   BMI Assessment: Estimated body mass index is 26.52 kg/m as calculated from the following:   Height as of this encounter: 5' 2"  (1.575 m).   Weight as of this encounter: 145 lb (65.8 kg).  BMI interpretation table: BMI level  Category Range association with higher incidence of chronic pain  <18 kg/m2 Underweight   18.5-24.9 kg/m2 Ideal body weight   25-29.9 kg/m2 Overweight Increased incidence by 20%  30-34.9 kg/m2 Obese (Class I) Increased incidence by 68%  35-39.9 kg/m2 Severe obesity (Class II) Increased incidence by 136%  >40 kg/m2 Extreme obesity (Class III) Increased incidence by 254%   BMI Readings from Last 4 Encounters:   02/18/17 26.52 kg/m  12/02/16 26.52 kg/m  11/25/16 26.52 kg/m  03/17/16 26.41 kg/m   Wt Readings from Last 4 Encounters:  02/18/17 145 lb (65.8 kg)  12/02/16 145 lb (65.8 kg)  11/25/16 145 lb (65.8 kg)  03/17/16 144 lb 6.4 oz (65.5 kg)  Psych/Mental status: Alert, oriented x 3 (person, place, & time)       Eyes: PERLA Respiratory: No evidence of acute respiratory distress  Cervical Spine Area Exam  Skin & Axial Inspection: No masses, redness, edema, swelling, or associated skin lesions Alignment: Symmetrical Functional ROM: Decreased ROM     Pain with cervical extension Stability: No instability detected Muscle Tone/Strength: Functionally intact. No obvious neuro-muscular anomalies detected. Sensory (Neurological): Musculoskeletal pain pattern, articular pain pattern Palpation: Complains of area being tender to palpation overlying cervical facets              Upper Extremity (UE) Exam    Side: Right upper extremity  Side: Left upper extremity  Skin & Extremity Inspection: Skin color, temperature, and hair growth are WNL. No peripheral edema or cyanosis. No masses, redness, swelling, asymmetry, or associated skin lesions. No contractures.  Skin & Extremity Inspection: Skin color, temperature, and hair growth are WNL. No peripheral edema or cyanosis. No masses, redness, swelling, asymmetry, or associated skin lesions. No contractures.  Functional ROM: Unrestricted ROM          Functional ROM: Unrestricted ROM          Muscle Tone/Strength: Functionally intact. No obvious neuro-muscular anomalies detected.  Muscle Tone/Strength: Functionally intact. No obvious neuro-muscular anomalies detected.  Sensory (Neurological): Unimpaired          Sensory (Neurological): Unimpaired          Palpation: No palpable anomalies              Palpation: No palpable anomalies              Specialized Test(s): Deferred         Specialized Test(s): Deferred          Thoracic Spine Area Exam  Skin &  Axial Inspection: No masses, redness, or swelling Alignment: Symmetrical Functional ROM: Unrestricted ROM Stability: No instability detected Muscle Tone/Strength: Functionally intact. No obvious neuro-muscular anomalies detected. Sensory (Neurological): Unimpaired Muscle strength & Tone: No palpable anomalies  Lumbar Spine Area Exam  Skin & Axial Inspection: No masses, redness, or swelling Alignment: Symmetrical Functional ROM: Unrestricted ROM      Stability: No instability detected Muscle Tone/Strength: Functionally intact. No obvious neuro-muscular anomalies detected. Sensory (Neurological): Unimpaired Palpation: No palpable anomalies       Provocative Tests: Lumbar Hyperextension and rotation test: evaluation deferred today       Lumbar Lateral bending test: evaluation deferred today       Patrick's Maneuver: evaluation deferred today                    Gait & Posture Assessment  Ambulation: Unassisted Gait: Relatively normal for age and body habitus Posture: WNL   Lower Extremity Exam  Side: Right lower extremity  Side: Left lower extremity  Skin & Extremity Inspection: Skin color, temperature, and hair growth are WNL. No peripheral edema or cyanosis. No masses, redness, swelling, asymmetry, or associated skin lesions. No contractures.  Skin & Extremity Inspection: Skin color, temperature, and hair growth are WNL. No peripheral edema or cyanosis. No masses, redness, swelling, asymmetry, or associated skin lesions. No contractures.  Functional ROM: Unrestricted ROM          Functional ROM: Unrestricted ROM          Muscle Tone/Strength: Functionally intact. No obvious neuro-muscular anomalies detected.  Muscle Tone/Strength: Functionally intact. No obvious neuro-muscular anomalies detected.  Sensory (Neurological): Unimpaired  Sensory (Neurological): Unimpaired  Palpation: No palpable anomalies  Palpation: No palpable anomalies   Assessment  Primary Diagnosis & Pertinent  Problem List: The primary encounter diagnosis was Spondylosis of cervical region without myelopathy or radiculopathy. Diagnoses of Fibromyalgia, DDD (degenerative disc disease), cervical, and Chronic pain syndrome were also pertinent to this visit.  Visit Diagnosis (New problems to examiner): 1. Spondylosis of cervical region without myelopathy or radiculopathy   2. Fibromyalgia   3. DDD (degenerative disc disease), cervical   4. Chronic pain syndrome    Plan of Care (Initial workup plan)  Note: Please be advised that as per protocol, today's visit has been an evaluation only. We have not taken over the patient's controlled substance management.   General Recommendations: The pain condition that the patient suffers from is best treated with a multidisciplinary approach that involves an increase in physical activity to prevent de-conditioning and worsening of the pain cycle, as well as psychological counseling (formal and/or informal) to address the co-morbid psychological affects of pain. Treatment will often involve judicious use of pain medications and interventional procedures to decrease the pain, allowing the patient to participate in the physical activity that will ultimately produce long-lasting pain reductions. The goal of the multidisciplinary approach is to return the patient to a higher level of overall function and to restore their ability to perform activities of daily living.  56 year old female with multiple medical issues including Crohn's status post colectomy, depression, anxiety, fibromyalgia who presents with diffuse whole body pain most prominent in her upper extremity joints and most recently in her neck.  Patient cervical MRI shows cervical degenerative disc disease and cervical spondylosis most pronounced at C4, C5, C6.  She is status post a trapezius trigger point injection which was moderately helpful for 2 weeks.  Patient does have pain with cervical extension and palpation  overlying her bilateral cervical facets.  We discussed the potential risks and benefits of a diagnostic bilateral cervical facet block and patient would like to proceed.  In regards to medication management, patient is on baclofen 10 mg nightly, gabapentin 800 mg 3 times daily, Ativan 0.5 mg nightly (uses 5-6 times per month), tramadol 50 mg twice daily to 3 times daily as needed (does not take every day).  I advised the patient to find alternatives to Ativan given the risk of respiratory depression with concomitant benzodiazepine and opioid therapy.  Patient states that she has tried various anxiolytics and that this is one her psychiatrist, therapist has found helpful in her anxiety management.  In regards to fibromyalgia, I encouraged the patient to continue physical therapy, tai chi, and stretching.  Plan: -Urine drug screen today: Should be positive for tramadol and possibly Ativan. -Schedule bilateral cervical diagnostic facet blocks at C4, C5, C6. -Continue current medications as prescribed. -We will consider  taking over patient's opioid regimen after urine drug screen that was collected today.   Ordered Lab-work, Procedure(s), Referral(s), & Consult(s): Orders Placed This Encounter  Procedures  . CERVICAL FACET (MEDIAL BRANCH NERVE BLOCK)   . Compliance Drug Analysis, Ur   Pharmacotherapy (current): Medications ordered:  No orders of the defined types were placed in this encounter.  Medications administered during this visit: Ms. Badalamenti had no medications administered during this visit.   Pharmacological management options:  Opioid Analgesics: The patient was informed that there is no guarantee that she would be a candidate for opioid analgesics. The decision will be made following CDC guidelines. This decision will be based on the results of diagnostic studies, as well as Ms. Jeanbaptiste's risk profile.   Membrane stabilizer: Failed Lyrica, Cymbalta  Muscle relaxant: Has tried  Skelaxin, baclofen, tizanidine.  Finds baclofen and tizanidine most helpful.  NSAID: To be determined at a later time  Other analgesic(s): To be determined at a later time   Interventional management options: Ms. Nesmith was informed that there is no guarantee that she would be a candidate for interventional therapies. The decision will be based on the results of diagnostic studies, as well as Ms. Mohar's risk profile.  Procedure(s) under consideration:  -Bilateral C4, C5, C6 medial branch nerve block -Repeat trigger point injections.  Trapezius region.   Provider-requested follow-up: Return for Procedure.  Future Appointments Date Time Provider [place]  02/25/2017 8:15 AM Gillis Santa, MD [facility] None    Primary Care Physician: Sharyne Peach, MD Location: [facility] Note by: Gillis Santa, M.D, Date: 02/18/2017; Time: 4:03 PM  Patient Instructions   1. Schedule for bilateral C4, C5, C6 facet blocks with sedation 2. Urine drug screen today 3. Follow up for procedure  Pain Management Discharge Instructions  General Discharge Instructions :  If you need to reach your doctor call: Monday-Friday 8:00 am - 4:00 pm at [phone] or toll free [phone].  After clinic hours [phone] to have operator reach doctor.  Bring all of your medication bottles to all your appointments in the pain clinic.  To cancel or reschedule your appointment with Pain Management please remember to call 24 hours in advance to avoid a fee.  Refer to the educational materials which you have been given on: General Risks, I had my Procedure. Discharge Instructions, Post Sedation.  Post Procedure Instructions:  The drugs you were given will stay in your system until tomorrow, so for the next 24 hours you should not drive, make any legal decisions or drink any alcoholic beverages.  You may eat anything you prefer, but it is better to start with liquids  then soups and crackers, and gradually work up to solid foods.  Please notify your doctor immediately if you have any unusual bleeding, trouble breathing or pain that is not related to your normal pain.  Depending on the type of procedure that was done, some parts of your body may feel week and/or numb.  This usually clears up by tonight or the next day.  Walk with the use of an assistive device or accompanied by an adult for the 24 hours.  You may use ice on the affected area for the first 24 hours.  Put ice in a Ziploc bag and cover with a towel and place against area 15 minutes on 15 minutes off.  You may switch to heat after 24 hours.GENERAL RISKS AND COMPLICATIONS  What are the risk, side effects and possible complications?  Generally speaking, most procedures are safe.  However, with any procedure there are risks, side effects, and the possibility of complications.  The risks and complications are dependent upon the sites that are lesioned, or the type of nerve block to be performed.  The closer the procedure is to the spine, the more serious the risks are.  Great care is taken when placing the radio frequency needles, block needles or lesioning probes, but sometimes complications can occur. 1. Infection: Any time there is an injection through the skin, there is a risk of infection.  This is why sterile conditions are used for these blocks.  There are four possible types of infection. 1. Localized skin infection. 2. Central Nervous System Infection-This can be in the form of Meningitis, which can be deadly. 3. Epidural Infections-This can be in the form of an epidural abscess, which can cause pressure inside of the spine, causing compression of the spinal cord with subsequent paralysis. This would require an emergency surgery to decompress, and there are no guarantees that the patient would recover from the paralysis. 4. Discitis-This is an infection of the intervertebral discs.  It occurs in  about 1% of discography procedures.  It is difficult to treat and it may lead to surgery.        2. Pain: the needles have to go through skin and soft tissues, will cause soreness.       3. Damage to internal structures:  The nerves to be lesioned may be near blood vessels or    other nerves which can be potentially damaged.       4. Bleeding: Bleeding is more common if the patient is taking blood thinners such as  aspirin, Coumadin, Ticiid, Plavix, etc., or if he/she have some genetic predisposition  such as hemophilia. Bleeding into the spinal canal can cause compression of the spinal  cord with subsequent paralysis.  This would require an emergency surgery to  decompress and there are no guarantees that the patient would recover from the  paralysis.       5. Pneumothorax:  Puncturing of a lung is a possibility, every time a needle is introduced in  the area of the chest or upper back.  Pneumothorax refers to free air around the  collapsed lung(s), inside of the thoracic cavity (chest cavity).  Another two possible  complications related to a similar event would include: Hemothorax and Chylothorax.   These are variations of the Pneumothorax, where instead of air around the collapsed  lung(s), you may have blood or chyle, respectively.       6. Spinal headaches: They may occur with any procedures in the area of the spine.       7. Persistent CSF (Cerebro-Spinal Fluid) leakage: This is a rare problem, but may occur  with prolonged intrathecal or epidural catheters either due to the formation of a fistulous  track or a dural tear.       8. Nerve damage: By working so close to the spinal cord, there is always a possibility of  nerve damage, which could be as serious as a permanent spinal cord injury with  paralysis.       9. Death:  Although rare, severe deadly allergic reactions known as "Anaphylactic  reaction" can occur to any of the medications used.      10. Worsening of the symptoms:  We can always  make thing worse.  What are the chances of something like this happening? Chances of any  of this occuring are extremely low.  By statistics, you have more of a chance of getting killed in a motor vehicle accident: while driving to the hospital than any of the above occurring .  Nevertheless, you should be aware that they are possibilities.  In general, it is similar to taking a shower.  Everybody knows that you can slip, hit your head and get killed.  Does that mean that you should not shower again?  Nevertheless always keep in mind that statistics do not mean anything if you happen to be on the wrong side of them.  Even if a procedure has a 1 (one) in a 1,000,000 (million) chance of going wrong, it you happen to be that one..Also, keep in mind that by statistics, you have more of a chance of having something go wrong when taking medications.  Who should not have this procedure? If you are on a blood thinning medication (e.g. Coumadin, Plavix, see list of "Blood Thinners"), or if you have an active infection going on, you should not have the procedure.  If you are taking any blood thinners, please inform your physician.  How should I prepare for this procedure?  Do not eat or drink anything at least six hours prior to the procedure.  Bring a driver with you .  It cannot be a taxi.  Come accompanied by an adult that can drive you back, and that is strong enough to help you if your legs get weak or numb from the local anesthetic.  Take all of your medicines the morning of the procedure with just enough water to swallow them.  If you have diabetes, make sure that you are scheduled to have your procedure done first thing in the morning, whenever possible.  If you have diabetes, take only half of your insulin dose and notify our nurse that you have done so as soon as you arrive at the clinic.  If you are diabetic, but only take blood sugar pills (oral hypoglycemic), then do not take them on the  morning of your procedure.  You may take them after you have had the procedure.  Do not take aspirin or any aspirin-containing medications, at least eleven (11) days prior to the procedure.  They may prolong bleeding.  Wear loose fitting clothing that may be easy to take off and that you would not mind if it got stained with Betadine or blood.  Do not wear any jewelry or perfume  Remove any nail coloring.  It will interfere with some of our monitoring equipment.  NOTE: Remember that this is not meant to be interpreted as a complete list of all possible complications.  Unforeseen problems may occur.  BLOOD THINNERS The following drugs contain aspirin or other products, which can cause increased bleeding during surgery and should not be taken for 2 weeks prior to and 1 week after surgery.  If you should need take something for relief of minor pain, you may take acetaminophen which is found in Tylenol,m Datril, Anacin-3 and Panadol. It is not blood thinner. The products listed below are.  Do not take any of the products listed below in addition to any listed on your instruction sheet.  A.P.C or A.P.C with Codeine Codeine Phosphate Capsules #3 Ibuprofen Ridaura  ABC compound Congesprin Imuran rimadil  Advil Cope Indocin Robaxisal  Alka-Seltzer Effervescent Pain Reliever and Antacid Coricidin or Coricidin-D  Indomethacin Rufen  Alka-Seltzer plus Cold Medicine Cosprin Ketoprofen S-A-C Tablets  Anacin Analgesic Tablets or  Capsules Coumadin Korlgesic Salflex  Anacin Extra Strength Analgesic tablets or capsules CP-2 Tablets Lanoril Salicylate  Anaprox Cuprimine Capsules Levenox Salocol  Anexsia-D Dalteparin Magan Salsalate  Anodynos Darvon compound Magnesium Salicylate Sine-off  Ansaid Dasin Capsules Magsal Sodium Salicylate  Anturane Depen Capsules Marnal Soma  APF Arthritis pain formula Dewitt's Pills Measurin Stanback  Argesic Dia-Gesic Meclofenamic Sulfinpyrazone  Arthritis Bayer Timed  Release Aspirin Diclofenac Meclomen Sulindac  Arthritis pain formula Anacin Dicumarol Medipren Supac  Analgesic (Safety coated) Arthralgen Diffunasal Mefanamic Suprofen  Arthritis Strength Bufferin Dihydrocodeine Mepro Compound Suprol  Arthropan liquid Dopirydamole Methcarbomol with Aspirin Synalgos  ASA tablets/Enseals Disalcid Micrainin Tagament  Ascriptin Doan's Midol Talwin  Ascriptin A/D Dolene Mobidin Tanderil  Ascriptin Extra Strength Dolobid Moblgesic Ticlid  Ascriptin with Codeine Doloprin or Doloprin with Codeine Momentum Tolectin  Asperbuf Duoprin Mono-gesic Trendar  Aspergum Duradyne Motrin or Motrin IB Triminicin  Aspirin plain, buffered or enteric coated Durasal Myochrisine Trigesic  Aspirin Suppositories Easprin Nalfon Trillsate  Aspirin with Codeine Ecotrin Regular or Extra Strength Naprosyn Uracel  Atromid-S Efficin Naproxen Ursinus  Auranofin Capsules Elmiron Neocylate Vanquish  Axotal Emagrin Norgesic Verin  Azathioprine Empirin or Empirin with Codeine Normiflo Vitamin E  Azolid Emprazil Nuprin Voltaren  Bayer Aspirin plain, buffered or children's or timed BC Tablets or powders Encaprin Orgaran Warfarin Sodium  Buff-a-Comp Enoxaparin Orudis Zorpin  Buff-a-Comp with Codeine Equegesic Os-Cal-Gesic   Buffaprin Excedrin plain, buffered or Extra Strength Oxalid   Bufferin Arthritis Strength Feldene Oxphenbutazone   Bufferin plain or Extra Strength Feldene Capsules Oxycodone with Aspirin   Bufferin with Codeine Fenoprofen Fenoprofen Pabalate or Pabalate-SF   Buffets II Flogesic Panagesic   Buffinol plain or Extra Strength Florinal or Florinal with Codeine Panwarfarin   Buf-Tabs Flurbiprofen Penicillamine   Butalbital Compound Four-way cold tablets Penicillin   Butazolidin Fragmin Pepto-Bismol   Carbenicillin Geminisyn Percodan   Carna Arthritis Reliever Geopen Persantine   Carprofen Gold's salt Persistin   Chloramphenicol Goody's Phenylbutazone   Chloromycetin  Haltrain Piroxlcam   Clmetidine heparin Plaquenil   Cllnoril Hyco-pap Ponstel   Clofibrate Hydroxy chloroquine Propoxyphen         Before stopping any of these medications, be sure to consult the physician who ordered them.  Some, such as Coumadin (Warfarin) are ordered to prevent or treat serious conditions such as "deep thrombosis", "pumonary embolisms", and other heart problems.  The amount of time that you may need off of the medication may also vary with the medication and the reason for which you were taking it.  If you are taking any of these medications, please make sure you notify your pain physician before you undergo any procedures.          Facet Joint Block The facet joints connect the bones of the spine (vertebrae). They make it possible for you to bend, twist, and make other movements with your spine. They also keep you from bending too far, twisting too far, and making other excessive movements. A facet joint block is a procedure where a numbing medicine (anesthetic) is injected into a facet joint. Often, a type of anti-inflammatory medicine called a steroid is also injected. A facet joint block may be done to diagnose neck or back pain. If the pain gets better after a facet joint block, it means the pain is probably coming from the facet joint. If the pain does not get better, it means the pain is probably not coming from the facet joint. A facet joint block may also be done  to relieve neck or back pain caused by an inflamed facet joint. A facet joint block is only done to relieve pain if the pain does not improve with other methods, such as medicine, exercise programs, and physical therapy. Tell a health care provider about:  Any allergies you have.  All medicines you are taking, including vitamins, herbs, eye drops, creams, and over-the-counter medicines.  Any problems you or family members have had with anesthetic medicines.  Any blood disorders you have.  Any  surgeries you have had.  Any medical conditions you have.  Whether you are pregnant or may be pregnant. What are the risks? Generally, this is a safe procedure. However, problems may occur, including:  Bleeding.  Injury to a nerve near the injection site.  Pain at the injection site.  Weakness or numbness in areas controlled by nerves near the injection site.  Infection.  Temporary fluid retention.  Allergic reactions to medicines or dyes.  Injury to other structures or organs near the injection site.  What happens before the procedure?  Follow instructions from your health care provider about eating or drinking restrictions.  Ask your health care provider about: ? Changing or stopping your regular medicines. This is especially important if you are taking diabetes medicines or blood thinners. ? Taking medicines such as aspirin and ibuprofen. These medicines can thin your blood. Do not take these medicines before your procedure if your health care provider instructs you not to.  Do not take any new dietary supplements or medicines without asking your health care provider first.  Plan to have someone take you home after the procedure. What happens during the procedure?  You may need to remove your clothing and dress in an open-back gown.  The procedure will be done while you are lying on an X-ray table. You will most likely be asked to lie on your stomach, but you may be asked to lie in a different position if an injection will be made in your neck.  Machines will be used to monitor your oxygen levels, heart rate, and blood pressure.  If an injection will be made in your neck, an IV tube will be inserted into one of your veins. Fluids and medicine will flow directly into your body through the IV tube.  The area over the facet joint where the injection will be made will be cleaned with soap. The surrounding skin will be covered with clean drapes.  A numbing medicine (local  anesthetic) will be applied to your skin. Your skin may sting or burn for a moment.  A video X-ray machine (fluoroscopy) will be used to locate the joint. In some cases, a CT scan may be used.  A contrast dye may be injected into the facet joint area to help locate the joint.  When the joint is located, an anesthetic will be injected into the joint through the needle.  Your health care provider will ask you whether you feel pain relief. If you do feel relief, a steroid may be injected to provide pain relief for a longer period of time. If you do not feel relief or feel only partial relief, additional injections of an anesthetic may be made in other facet joints.  The needle will be removed.  Your skin will be cleaned.  A bandage (dressing) will be applied over each injection site. The procedure may vary among health care providers and hospitals. What happens after the procedure?  You will be observed for 15-30 minutes before  being allowed to go home. This information is not intended to replace advice given to you by your health care provider. Make sure you discuss any questions you have with your health care provider. Document Released: 08/27/2006 Document Revised: 05/09/2015 Document Reviewed: 01/01/2015 Elsevier Interactive Patient Education  [employer].

## 2017-02-18 NOTE — Patient Instructions (Addendum)
1. Schedule for bilateral C4, C5, C6 facet blocks with sedation 2. Urine drug screen today 3. Follow up for procedure  Pain Management Discharge Instructions  General Discharge Instructions :  If you need to reach your doctor call: Monday-Friday 8:00 am - 4:00 pm at [phone] or toll free [phone].  After clinic hours [phone] to have operator reach doctor.  Bring all of your medication bottles to all your appointments in the pain clinic.  To cancel or reschedule your appointment with Pain Management please remember to call 24 hours in advance to avoid a fee.  Refer to the educational materials which you have been given on: General Risks, I had my Procedure. Discharge Instructions, Post Sedation.  Post Procedure Instructions:  The drugs you were given will stay in your system until tomorrow, so for the next 24 hours you should not drive, make any legal decisions or drink any alcoholic beverages.  You may eat anything you prefer, but it is better to start with liquids then soups and crackers, and gradually work up to solid foods.  Please notify your doctor immediately if you have any unusual bleeding, trouble breathing or pain that is not related to your normal pain.  Depending on the type of procedure that was done, some parts of your body may feel week and/or numb.  This usually clears up by tonight or the next day.  Walk with the use of an assistive device or accompanied by an adult for the 24 hours.  You may use ice on the affected area for the first 24 hours.  Put ice in a Ziploc bag and cover with a towel and place against area 15 minutes on 15 minutes off.  You may switch to heat after 24 hours.GENERAL RISKS AND COMPLICATIONS  What are the risk, side effects and possible complications? Generally speaking, most procedures are safe.  However, with any procedure there are risks, side effects, and the possibility of complications.  The risks and complications are  dependent upon the sites that are lesioned, or the type of nerve block to be performed.  The closer the procedure is to the spine, the more serious the risks are.  Great care is taken when placing the radio frequency needles, block needles or lesioning probes, but sometimes complications can occur. 1. Infection: Any time there is an injection through the skin, there is a risk of infection.  This is why sterile conditions are used for these blocks.  There are four possible types of infection. 1. Localized skin infection. 2. Central Nervous System Infection-This can be in the form of Meningitis, which can be deadly. 3. Epidural Infections-This can be in the form of an epidural abscess, which can cause pressure inside of the spine, causing compression of the spinal cord with subsequent paralysis. This would require an emergency surgery to decompress, and there are no guarantees that the patient would recover from the paralysis. 4. Discitis-This is an infection of the intervertebral discs.  It occurs in about 1% of discography procedures.  It is difficult to treat and it may lead to surgery.        2. Pain: the needles have to go through skin and soft tissues, will cause soreness.       3. Damage to internal structures:  The nerves to be lesioned may be near blood vessels or    other nerves which can be potentially damaged.       4. Bleeding: Bleeding is more common if the patient  is taking blood thinners such as  aspirin, Coumadin, Ticiid, Plavix, etc., or if he/she have some genetic predisposition  such as hemophilia. Bleeding into the spinal canal can cause compression of the spinal  cord with subsequent paralysis.  This would require an emergency surgery to  decompress and there are no guarantees that the patient would recover from the  paralysis.       5. Pneumothorax:  Puncturing of a lung is a possibility, every time a needle is introduced in  the area of the chest or upper back.  Pneumothorax refers  to free air around the  collapsed lung(s), inside of the thoracic cavity (chest cavity).  Another two possible  complications related to a similar event would include: Hemothorax and Chylothorax.   These are variations of the Pneumothorax, where instead of air around the collapsed  lung(s), you may have blood or chyle, respectively.       6. Spinal headaches: They may occur with any procedures in the area of the spine.       7. Persistent CSF (Cerebro-Spinal Fluid) leakage: This is a rare problem, but may occur  with prolonged intrathecal or epidural catheters either due to the formation of a fistulous  track or a dural tear.       8. Nerve damage: By working so close to the spinal cord, there is always a possibility of  nerve damage, which could be as serious as a permanent spinal cord injury with  paralysis.       9. Death:  Although rare, severe deadly allergic reactions known as "Anaphylactic  reaction" can occur to any of the medications used.      10. Worsening of the symptoms:  We can always make thing worse.  What are the chances of something like this happening? Chances of any of this occuring are extremely low.  By statistics, you have more of a chance of getting killed in a motor vehicle accident: while driving to the hospital than any of the above occurring .  Nevertheless, you should be aware that they are possibilities.  In general, it is similar to taking a shower.  Everybody knows that you can slip, hit your head and get killed.  Does that mean that you should not shower again?  Nevertheless always keep in mind that statistics do not mean anything if you happen to be on the wrong side of them.  Even if a procedure has a 1 (one) in a 1,000,000 (million) chance of going wrong, it you happen to be that one..Also, keep in mind that by statistics, you have more of a chance of having something go wrong when taking medications.  Who should not have this procedure? If you are on a blood thinning  medication (e.g. Coumadin, Plavix, see list of "Blood Thinners"), or if you have an active infection going on, you should not have the procedure.  If you are taking any blood thinners, please inform your physician.  How should I prepare for this procedure?  Do not eat or drink anything at least six hours prior to the procedure.  Bring a driver with you .  It cannot be a taxi.  Come accompanied by an adult that can drive you back, and that is strong enough to help you if your legs get weak or numb from the local anesthetic.  Take all of your medicines the morning of the procedure with just enough water to swallow them.  If you have diabetes, make  sure that you are scheduled to have your procedure done first thing in the morning, whenever possible.  If you have diabetes, take only half of your insulin dose and notify our nurse that you have done so as soon as you arrive at the clinic.  If you are diabetic, but only take blood sugar pills (oral hypoglycemic), then do not take them on the morning of your procedure.  You may take them after you have had the procedure.  Do not take aspirin or any aspirin-containing medications, at least eleven (11) days prior to the procedure.  They may prolong bleeding.  Wear loose fitting clothing that may be easy to take off and that you would not mind if it got stained with Betadine or blood.  Do not wear any jewelry or perfume  Remove any nail coloring.  It will interfere with some of our monitoring equipment.  NOTE: Remember that this is not meant to be interpreted as a complete list of all possible complications.  Unforeseen problems may occur.  BLOOD THINNERS The following drugs contain aspirin or other products, which can cause increased bleeding during surgery and should not be taken for 2 weeks prior to and 1 week after surgery.  If you should need take something for relief of minor pain, you may take acetaminophen which is found in Tylenol,m  Datril, Anacin-3 and Panadol. It is not blood thinner. The products listed below are.  Do not take any of the products listed below in addition to any listed on your instruction sheet.  A.P.C or A.P.C with Codeine Codeine Phosphate Capsules #3 Ibuprofen Ridaura  ABC compound Congesprin Imuran rimadil  Advil Cope Indocin Robaxisal  Alka-Seltzer Effervescent Pain Reliever and Antacid Coricidin or Coricidin-D  Indomethacin Rufen  Alka-Seltzer plus Cold Medicine Cosprin Ketoprofen S-A-C Tablets  Anacin Analgesic Tablets or Capsules Coumadin Korlgesic Salflex  Anacin Extra Strength Analgesic tablets or capsules CP-2 Tablets Lanoril Salicylate  Anaprox Cuprimine Capsules Levenox Salocol  Anexsia-D Dalteparin Magan Salsalate  Anodynos Darvon compound Magnesium Salicylate Sine-off  Ansaid Dasin Capsules Magsal Sodium Salicylate  Anturane Depen Capsules Marnal Soma  APF Arthritis pain formula Dewitt's Pills Measurin Stanback  Argesic Dia-Gesic Meclofenamic Sulfinpyrazone  Arthritis Bayer Timed Release Aspirin Diclofenac Meclomen Sulindac  Arthritis pain formula Anacin Dicumarol Medipren Supac  Analgesic (Safety coated) Arthralgen Diffunasal Mefanamic Suprofen  Arthritis Strength Bufferin Dihydrocodeine Mepro Compound Suprol  Arthropan liquid Dopirydamole Methcarbomol with Aspirin Synalgos  ASA tablets/Enseals Disalcid Micrainin Tagament  Ascriptin Doan's Midol Talwin  Ascriptin A/D Dolene Mobidin Tanderil  Ascriptin Extra Strength Dolobid Moblgesic Ticlid  Ascriptin with Codeine Doloprin or Doloprin with Codeine Momentum Tolectin  Asperbuf Duoprin Mono-gesic Trendar  Aspergum Duradyne Motrin or Motrin IB Triminicin  Aspirin plain, buffered or enteric coated Durasal Myochrisine Trigesic  Aspirin Suppositories Easprin Nalfon Trillsate  Aspirin with Codeine Ecotrin Regular or Extra Strength Naprosyn Uracel  Atromid-S Efficin Naproxen Ursinus  Auranofin Capsules Elmiron Neocylate Vanquish   Axotal Emagrin Norgesic Verin  Azathioprine Empirin or Empirin with Codeine Normiflo Vitamin E  Azolid Emprazil Nuprin Voltaren  Bayer Aspirin plain, buffered or children's or timed BC Tablets or powders Encaprin Orgaran Warfarin Sodium  Buff-a-Comp Enoxaparin Orudis Zorpin  Buff-a-Comp with Codeine Equegesic Os-Cal-Gesic   Buffaprin Excedrin plain, buffered or Extra Strength Oxalid   Bufferin Arthritis Strength Feldene Oxphenbutazone   Bufferin plain or Extra Strength Feldene Capsules Oxycodone with Aspirin   Bufferin with Codeine Fenoprofen Fenoprofen Pabalate or Pabalate-SF   Buffets II Flogesic Panagesic   Buffinol  plain or Extra Strength Florinal or Florinal with Codeine Panwarfarin   Buf-Tabs Flurbiprofen Penicillamine   Butalbital Compound Four-way cold tablets Penicillin   Butazolidin Fragmin Pepto-Bismol   Carbenicillin Geminisyn Percodan   Carna Arthritis Reliever Geopen Persantine   Carprofen Gold's salt Persistin   Chloramphenicol Goody's Phenylbutazone   Chloromycetin Haltrain Piroxlcam   Clmetidine heparin Plaquenil   Cllnoril Hyco-pap Ponstel   Clofibrate Hydroxy chloroquine Propoxyphen         Before stopping any of these medications, be sure to consult the physician who ordered them.  Some, such as Coumadin (Warfarin) are ordered to prevent or treat serious conditions such as "deep thrombosis", "pumonary embolisms", and other heart problems.  The amount of time that you may need off of the medication may also vary with the medication and the reason for which you were taking it.  If you are taking any of these medications, please make sure you notify your pain physician before you undergo any procedures.          Facet Joint Block The facet joints connect the bones of the spine (vertebrae). They make it possible for you to bend, twist, and make other movements with your spine. They also keep you from bending too far, twisting too far, and making other excessive  movements. A facet joint block is a procedure where a numbing medicine (anesthetic) is injected into a facet joint. Often, a type of anti-inflammatory medicine called a steroid is also injected. A facet joint block may be done to diagnose neck or back pain. If the pain gets better after a facet joint block, it means the pain is probably coming from the facet joint. If the pain does not get better, it means the pain is probably not coming from the facet joint. A facet joint block may also be done to relieve neck or back pain caused by an inflamed facet joint. A facet joint block is only done to relieve pain if the pain does not improve with other methods, such as medicine, exercise programs, and physical therapy. Tell a health care provider about:  Any allergies you have.  All medicines you are taking, including vitamins, herbs, eye drops, creams, and over-the-counter medicines.  Any problems you or family members have had with anesthetic medicines.  Any blood disorders you have.  Any surgeries you have had.  Any medical conditions you have.  Whether you are pregnant or may be pregnant. What are the risks? Generally, this is a safe procedure. However, problems may occur, including:  Bleeding.  Injury to a nerve near the injection site.  Pain at the injection site.  Weakness or numbness in areas controlled by nerves near the injection site.  Infection.  Temporary fluid retention.  Allergic reactions to medicines or dyes.  Injury to other structures or organs near the injection site.  What happens before the procedure?  Follow instructions from your health care provider about eating or drinking restrictions.  Ask your health care provider about: ? Changing or stopping your regular medicines. This is especially important if you are taking diabetes medicines or blood thinners. ? Taking medicines such as aspirin and ibuprofen. These medicines can thin your blood. Do not take these  medicines before your procedure if your health care provider instructs you not to.  Do not take any new dietary supplements or medicines without asking your health care provider first.  Plan to have someone take you home after the procedure. What happens during the procedure?  You may need to remove your clothing and dress in an open-back gown.  The procedure will be done while you are lying on an X-ray table. You will most likely be asked to lie on your stomach, but you may be asked to lie in a different position if an injection will be made in your neck.  Machines will be used to monitor your oxygen levels, heart rate, and blood pressure.  If an injection will be made in your neck, an IV tube will be inserted into one of your veins. Fluids and medicine will flow directly into your body through the IV tube.  The area over the facet joint where the injection will be made will be cleaned with soap. The surrounding skin will be covered with clean drapes.  A numbing medicine (local anesthetic) will be applied to your skin. Your skin may sting or burn for a moment.  A video X-ray machine (fluoroscopy) will be used to locate the joint. In some cases, a CT scan may be used.  A contrast dye may be injected into the facet joint area to help locate the joint.  When the joint is located, an anesthetic will be injected into the joint through the needle.  Your health care provider will ask you whether you feel pain relief. If you do feel relief, a steroid may be injected to provide pain relief for a longer period of time. If you do not feel relief or feel only partial relief, additional injections of an anesthetic may be made in other facet joints.  The needle will be removed.  Your skin will be cleaned.  A bandage (dressing) will be applied over each injection site. The procedure may vary among health care providers and hospitals. What happens after the procedure?  You will be observed for  15-30 minutes before being allowed to go home. This information is not intended to replace advice given to you by your health care provider. Make sure you discuss any questions you have with your health care provider. Document Released: 08/27/2006 Document Revised: 05/09/2015 Document Reviewed: 01/01/2015 [employer] Interactive Patient Education  [employer].

## 2017-02-24 LAB — COMPLIANCE DRUG ANALYSIS, UR

## 2017-02-25 ENCOUNTER — Ambulatory Visit
Admission: RE | Admit: 2017-02-25 | Discharge: 2017-02-25 | Disposition: A | Discharge status: Home / Self Care | Payer: Medicare Other | Source: Ambulatory Visit | Attending: Student in an Organized Health Care Education/Training Program | Admitting: Student in an Organized Health Care Education/Training Program

## 2017-02-25 ENCOUNTER — Ambulatory Visit (HOSPITAL_BASED_OUTPATIENT_CLINIC_OR_DEPARTMENT_OTHER): Payer: Medicare Other | Admitting: Student in an Organized Health Care Education/Training Program

## 2017-02-25 ENCOUNTER — Encounter: Payer: Self-pay | Admitting: Student in an Organized Health Care Education/Training Program

## 2017-02-25 VITALS — BP 143/78 | HR 96 | Temp 98.0°F | Resp 18 | Ht 62.0 in | Wt 145.0 lb

## 2017-02-25 DIAGNOSIS — G894 Chronic pain syndrome: Secondary | ICD-10-CM | POA: Diagnosis not present

## 2017-02-25 DIAGNOSIS — Z91040 Latex allergy status: Secondary | ICD-10-CM | POA: Insufficient documentation

## 2017-02-25 DIAGNOSIS — M5030 Other cervical disc degeneration, unspecified cervical region: Secondary | ICD-10-CM

## 2017-02-25 DIAGNOSIS — M47812 Spondylosis without myelopathy or radiculopathy, cervical region: Secondary | ICD-10-CM

## 2017-02-25 DIAGNOSIS — Z881 Allergy status to other antibiotic agents status: Secondary | ICD-10-CM | POA: Insufficient documentation

## 2017-02-25 DIAGNOSIS — Z91012 Allergy to eggs: Secondary | ICD-10-CM | POA: Diagnosis not present

## 2017-02-25 DIAGNOSIS — Z880 Allergy status to penicillin: Secondary | ICD-10-CM | POA: Diagnosis not present

## 2017-02-25 DIAGNOSIS — Z91013 Allergy to seafood: Secondary | ICD-10-CM | POA: Insufficient documentation

## 2017-02-25 DIAGNOSIS — Z91010 Allergy to peanuts: Secondary | ICD-10-CM | POA: Diagnosis not present

## 2017-02-25 MED ORDER — FENTANYL CITRATE (PF) 100 MCG/2ML IJ SOLN
25.0000 ug | INTRAMUSCULAR | Status: DC | PRN
  Administered 2017-02-25: 100 ug via INTRAVENOUS
  Filled 2017-02-25: qty 2

## 2017-02-25 MED ORDER — LACTATED RINGERS IV SOLN: 1000.0000 mL | Freq: Once | INTRAVENOUS | Status: DC

## 2017-02-25 MED ORDER — LIDOCAINE HCL (PF) 1 % IJ SOLN
INTRAMUSCULAR | Status: AC
  Filled 2017-02-25: qty 5

## 2017-02-25 MED ORDER — LIDOCAINE HCL (PF) 1 % IJ SOLN
10.0000 mL | Freq: Once | INTRAMUSCULAR | Status: AC
  Administered 2017-02-25: 5 mL
  Filled 2017-02-25: qty 10

## 2017-02-25 MED ORDER — ROPIVACAINE HCL 2 MG/ML IJ SOLN
10.0000 mL | Freq: Once | INTRAMUSCULAR | Status: AC
  Administered 2017-02-25: 10 mL
  Filled 2017-02-25: qty 10

## 2017-02-25 MED ORDER — SODIUM CHLORIDE 0.9 % IJ SOLN
INTRAMUSCULAR | Status: AC
  Filled 2017-02-25: qty 10

## 2017-02-25 NOTE — Patient Instructions (Signed)

## 2017-02-25 NOTE — Progress Notes (Signed)
Safety precautions to be maintained throughout the outpatient stay will include: orient to surroundings, keep bed in low position, maintain call bell within reach at all times, provide assistance with transfer out of bed and ambulation.  

## 2017-02-25 NOTE — Progress Notes (Signed)
Patient's Name: Caroline Hughes  MRN: [id number]  Referring Provider: Sharyne Peach, MD  DOB: 1961/02/05  PCP: Sharyne Peach, MD  DOS: 02/25/2017  Note by: Gillis Santa, MD  Service setting: Ambulatory outpatient  Specialty: Interventional Pain Management  Patient type: Established  Location: [facility] (AMB) Pain Management Facility  Visit type: Interventional Procedure   Primary Reason for Visit: Interventional Pain Management Treatment. CC: Neck Pain (bilateral)  Procedure:  Anesthesia, Analgesia, Anxiolysis:  Type: Diagnostic Cervical Facet Medial Branch Block(s) Region: Posterolateral cervical spine region Level: C4, C5, C6, Medial Branch Level(s) Laterality: Bilateral Paraspinal  Type: Local Anesthesia with Moderate (Conscious) Sedation Local Anesthetic: Lidocaine 1% Route: Intravenous (IV) IV Access: Secured Sedation: Meaningful verbal contact was maintained at all times during the procedure  Indication(s): Analgesia and Anxiety   Indications: 1. Spondylosis of cervical region without myelopathy or radiculopathy   2. DDD (degenerative disc disease), cervical   3. Chronic pain syndrome    Pain Score: Pre-procedure: 8 /10 Post-procedure: 0-No pain/10  Pre-op Assessment:  [name] is a 56 y.o. (year old), female patient, seen today for interventional treatment. She  has a past surgical history that includes Abdominal hysterectomy; rectal fistula packing; Ileostomy (Bilateral); Hernia repair; Cholecystectomy; Colon resection; CATARACT EXTRACTION PHACO AND INTRAOCULAR LENS PLACEMENT (IOC) (Left, 12/02/2016); and CATARACT EXTRACTION PHACO AND INTRAOCULAR LENS PLACEMENT (IOC) (Right, 11/25/2016). [name] has a current medication list which includes the following prescription(s): albuterol, albuterol, baclofen, certolizumab pegol, cyanocobalamin, dicyclomine, gabapentin, hydroxyzine, hyoscyamine, insulin detemir, lorazepam, milnacipran, montelukast, ondansetron, pancrelipase  (lip-prot-amyl), ranitidine, sucralfate, sumatriptan, tizanidine, tramadol, trazodone, durezol, metaxalone, moxifloxacin, and nepafenac, and the following Facility-Administered Medications: fentanyl and lactated ringers. Her primarily concern today is the Neck Pain (bilateral)  Initial Vital Signs: There were no vitals taken for this visit. BMI: Estimated body mass index is 26.52 kg/m as calculated from the following:   Height as of this encounter: 5' 2"  (1.575 m).   Weight as of this encounter: 145 lb (65.8 kg).  Risk Assessment: Allergies: Reviewed. She is allergic to naproxen; penicillins; gluten meal; lactose intolerance (gi); latex; other; shellfish allergy; soy allergy; sulfonylureas; doxycycline; eggs or egg-derived products; humira [adalimumab]; peanut-containing drug products; simvastatin; statins; sulfa antibiotics; and vancomycin.  Allergy Precautions: None required Coagulopathies: Reviewed. None identified.  Blood-thinner therapy: None at this time Active Infection(s): Reviewed. None identified. [name] is afebrile  Site Confirmation: [name] was asked to confirm the procedure and laterality before marking the site Procedure checklist: Completed Consent: Before the procedure and under the influence of no sedative(s), amnesic(s), or anxiolytics, the patient was informed of the treatment options, risks and possible complications. To fulfill our ethical and legal obligations, as recommended by the American Medical Association's Code of Ethics, I have informed the patient of my clinical impression; the nature and purpose of the treatment or procedure; the risks, benefits, and possible complications of the intervention; the alternatives, including doing nothing; the risk(s) and benefit(s) of the alternative treatment(s) or procedure(s); and the risk(s) and benefit(s) of doing nothing. The patient was provided information about the general risks and possible complications associated  with the procedure. These may include, but are not limited to: failure to achieve desired goals, infection, bleeding, organ or nerve damage, allergic reactions, paralysis, and death. In addition, the patient was informed of those risks and complications associated to Spine-related procedures, such as failure to decrease pain; infection (i.e.: Meningitis, epidural or intraspinal abscess); bleeding (i.e.: epidural hematoma, subarachnoid hemorrhage, or any other type of intraspinal  or peri-dural bleeding); organ or nerve damage (i.e.: Any type of peripheral nerve, nerve root, or spinal cord injury) with subsequent damage to sensory, motor, and/or autonomic systems, resulting in permanent pain, numbness, and/or weakness of one or several areas of the body; allergic reactions; (i.e.: anaphylactic reaction); and/or death. Furthermore, the patient was informed of those risks and complications associated with the medications. These include, but are not limited to: allergic reactions (i.e.: anaphylactic or anaphylactoid reaction(s)); adrenal axis suppression; blood sugar elevation that in diabetics may result in ketoacidosis or comma; water retention that in patients with history of congestive heart failure may result in shortness of breath, pulmonary edema, and decompensation with resultant heart failure; weight gain; swelling or edema; medication-induced neural toxicity; particulate matter embolism and blood vessel occlusion with resultant organ, and/or nervous system infarction; and/or aseptic necrosis of one or more joints. Finally, the patient was informed that Medicine is not an exact science; therefore, there is also the possibility of unforeseen or unpredictable risks and/or possible complications that may result in a catastrophic outcome. The patient indicated having understood very clearly. We have given the patient no guarantees and we have made no promises. Enough time was given to the patient to ask  questions, all of which were answered to the patient's satisfaction. [name] has indicated that she wanted to continue with the procedure. Attestation: I, the ordering provider, attest that I have discussed with the patient the benefits, risks, side-effects, alternatives, likelihood of achieving goals, and potential problems during recovery for the procedure that I have provided informed consent. Date: 02/25/2017; Time: 8:26 AM  Pre-Procedure Preparation:  Monitoring: As per clinic protocol. Respiration, ETCO2, SpO2, BP, heart rate and rhythm monitor placed and checked for adequate function Safety Precautions: Patient was assessed for positional comfort and pressure points before starting the procedure. Time-out: I initiated and conducted the "Time-out" before starting the procedure, as per protocol. The patient was asked to participate by confirming the accuracy of the "Time Out" information. Verification of the correct person, site, and procedure were performed and confirmed by me, the nursing staff, and the patient. "Time-out" conducted as per Joint Commission's Universal Protocol (UP.01.01.01). "Time-out" Date & Time: 02/25/2017; 0914 hrs.  Description of Procedure Process:   Position: Prone with head of the table was raised to facilitate breathing. Target Area: For Cervical Facet blocks, the target is the postero-lateral waist of the articular pillars at the C4, C5, C6, levels. Approach: Posterior approach. Area Prepped: Entire Posterior Cervico-thoracic Region Prepping solution: ChloraPrep (2% chlorhexidine gluconate and 70% isopropyl alcohol) Safety Precautions: Aspiration looking for blood return was conducted prior to all injections. At no point did we inject any substances, as a needle was being advanced. No attempts were made at seeking any paresthesias. Safe injection practices and needle disposal techniques used. Medications properly checked for expiration dates. SDV (single dose  vial) medications used. Description of the Procedure: Protocol guidelines were followed. The patient was placed in position over the fluoroscopy table. The target area was identified and the area prepped in the usual manner. Skin desensitized using vapocoolant spray. Skin & deeper tissues infiltrated with local anesthetic. Appropriate amount of time allowed to pass for local anesthetics to take effect. The procedure needle was introduced through the skin, ipsilateral to the reported pain, and advanced to the target area. Bone was contacted on the posterior aspect of the articular pillars and the needle walked lateral, until the border was cleared. Lateral views taken to make sure the needle tip  did not advance past the posterior third of the lateral mass of the posterior columns. The procedure was repeated in identical fashion for each level. Negative aspiration confirmed. Solution injected in intermittent fashion, asking for systemic symptoms every 0.5cc of injectate. The needles were then removed and the area cleansed, making sure to leave some of the prepping solution back to take advantage of its long term bactericidal properties. Vitals:   02/25/17 0925 02/25/17 0933 02/25/17 0943 02/25/17 0953  BP: (!) 156/96 (!) 158/95 (!) 147/81 (!) 143/78  Pulse:      Resp: (!) 9 12 19 18   Temp:      TempSrc:      SpO2: 94% 95% 96% 97%  Weight:      Height:        Start Time: 0915 hrs. End Time: 0933 hrs. Materials:  Needle(s) Type: Regular needle Gauge: 22G Length: 3.5-in Medication(s): We administered fentaNYL, lidocaine (PF), and ropivacaine (PF) 2 mg/mL (0.2%). Please see chart orders for dosing details. 1 cc of 0.2% ropivacaine at each level. Imaging Guidance (Spinal):  Type of Imaging Technique: Fluoroscopy Guidance (Spinal) Indication(s): Assistance in needle guidance and placement for procedures requiring needle placement in or near specific anatomical locations not easily accessible without  such assistance. Exposure Time: Please see nurses notes. Contrast: None used. Fluoroscopic Guidance: I was personally present during the use of fluoroscopy. "Tunnel Vision Technique" used to obtain the best possible view of the target area. Parallax error corrected before commencing the procedure. "Direction-depth-direction" technique used to introduce the needle under continuous pulsed fluoroscopy. Once target was reached, antero-posterior, oblique, and lateral fluoroscopic projection used confirm needle placement in all planes. Images permanently stored in EMR. Interpretation: No contrast injected. I personally interpreted the imaging intraoperatively. Adequate needle placement confirmed in multiple planes. Permanent images saved into the patient's record.  Antibiotic Prophylaxis:  Indication(s): None identified Antibiotic given: None  Post-operative Assessment:  EBL: None Complications: No immediate post-treatment complications observed by team, or reported by patient. Note: The patient tolerated the entire procedure well. A repeat set of vitals were taken after the procedure and the patient was kept under observation following institutional policy, for this type of procedure. Post-procedural neurological assessment was performed, showing return to baseline, prior to discharge. The patient was provided with post-procedure discharge instructions, including a section on how to identify potential problems. Should any problems arise concerning this procedure, the patient was given instructions to immediately contact us, at any time, without hesitation. In any case, we plan to contact the patient by telephone for a follow-up status report regarding this interventional procedure. Comments:  No additional relevant information. 5 out of 5 strength bilateral upper extremity: Shoulder abduction, elbow flexion, elbow extension, thumb extension.  Plan of Care  Follow-up in 3 weeks for postprocedural  evaluation.  Imaging Orders     DG C-Arm 1-60 Min-No Report Procedure Orders    No procedure(s) ordered today    Medications ordered for procedure: Meds ordered this encounter  Medications  . lactated ringers infusion 1,000 mL  . fentaNYL (SUBLIMAZE) injection 25-50 mcg    Make sure Narcan is available in the pyxis when using this medication. In the event of respiratory depression (RR< 8/min): Titrate NARCAN (naloxone) in increments of 0.1 to 0.2 mg IV at 2-3 minute intervals, until desired degree of reversal.  . lidocaine (PF) (XYLOCAINE) 1 % injection 10 mL  . ropivacaine (PF) 2 mg/mL (0.2%) (NAROPIN) injection 10 mL   Medications administered: We administered fentaNYL,  lidocaine (PF), and ropivacaine (PF) 2 mg/mL (0.2%).  See the medical record for exact dosing, route, and time of administration.  This SmartLink is deprecated. Use AVSMEDLIST instead to display the medication list for a patient. Disposition: Discharge home  Discharge Date & Time: 02/25/2017; 0956 hrs.   Physician-requested Follow-up: Return in about 3 weeks (around 03/18/2017) for Post Procedure Evaluation. Future Appointments  Date Time Provider [place]  03/19/2017  1:30 PM Gillis Santa, MD [facility] None   Primary Care Physician: Sharyne Peach, MD Location: [facility] Note by: Gillis Santa, MD Date: 02/25/2017; Time: 11:34 AM  Disclaimer:  Medicine is not an exact science. The only guarantee in medicine is that nothing is guaranteed. It is important to note that the decision to proceed with this intervention was based on the information collected from the patient. The Data and conclusions were drawn from the patient's questionnaire, the interview, and the physical examination. Because the information was provided in large part by the patient, it cannot be guaranteed that it has not been purposely or unconsciously manipulated. Every effort has been made to obtain as  much relevant data as possible for this evaluation. It is important to note that the conclusions that lead to this procedure are derived in large part from the available data. Always take into account that the treatment will also be dependent on availability of resources and existing treatment guidelines, considered by other Pain Management Practitioners as being common knowledge and practice, at the time of the intervention. For Medico-Legal purposes, it is also important to point out that variation in procedural techniques and pharmacological choices are the acceptable norm. The indications, contraindications, technique, and results of the above procedure s C4 hould only be interpreted and judged by a Board-Certified Interventional Pain Specialist with extensive familiarity and expertise in the same exact procedure and technique.

## 2017-02-26 ENCOUNTER — Telehealth: Payer: Self-pay | Admitting: *Deleted

## 2017-02-26 NOTE — Telephone Encounter (Signed)
Attempted to call for post procedure follow-up. Message left. 

## 2017-03-03 DIAGNOSIS — Z8719 Personal history of other diseases of the digestive system: Secondary | ICD-10-CM | POA: Insufficient documentation

## 2017-03-03 DIAGNOSIS — K8689 Other specified diseases of pancreas: Secondary | ICD-10-CM | POA: Insufficient documentation

## 2017-03-17 ENCOUNTER — Ambulatory Visit
Payer: Medicare Other | Attending: Student in an Organized Health Care Education/Training Program | Admitting: Student in an Organized Health Care Education/Training Program

## 2017-03-17 ENCOUNTER — Other Ambulatory Visit: Payer: Self-pay

## 2017-03-17 ENCOUNTER — Encounter: Payer: Self-pay | Admitting: Student in an Organized Health Care Education/Training Program

## 2017-03-17 VITALS — BP 156/94 | HR 91 | Temp 97.6°F | Resp 20 | Ht 62.0 in | Wt 144.0 lb

## 2017-03-17 DIAGNOSIS — J45909 Unspecified asthma, uncomplicated: Secondary | ICD-10-CM | POA: Insufficient documentation

## 2017-03-17 DIAGNOSIS — M542 Cervicalgia: Secondary | ICD-10-CM | POA: Insufficient documentation

## 2017-03-17 DIAGNOSIS — K219 Gastro-esophageal reflux disease without esophagitis: Secondary | ICD-10-CM | POA: Insufficient documentation

## 2017-03-17 DIAGNOSIS — F329 Major depressive disorder, single episode, unspecified: Secondary | ICD-10-CM | POA: Insufficient documentation

## 2017-03-17 DIAGNOSIS — Z8719 Personal history of other diseases of the digestive system: Secondary | ICD-10-CM | POA: Insufficient documentation

## 2017-03-17 DIAGNOSIS — E785 Hyperlipidemia, unspecified: Secondary | ICD-10-CM | POA: Insufficient documentation

## 2017-03-17 DIAGNOSIS — E1121 Type 2 diabetes mellitus with diabetic nephropathy: Secondary | ICD-10-CM | POA: Diagnosis not present

## 2017-03-17 DIAGNOSIS — K5190 Ulcerative colitis, unspecified, without complications: Secondary | ICD-10-CM | POA: Insufficient documentation

## 2017-03-17 DIAGNOSIS — F419 Anxiety disorder, unspecified: Secondary | ICD-10-CM | POA: Diagnosis not present

## 2017-03-17 DIAGNOSIS — M797 Fibromyalgia: Secondary | ICD-10-CM | POA: Insufficient documentation

## 2017-03-17 DIAGNOSIS — Z880 Allergy status to penicillin: Secondary | ICD-10-CM | POA: Diagnosis not present

## 2017-03-17 DIAGNOSIS — J40 Bronchitis, not specified as acute or chronic: Secondary | ICD-10-CM | POA: Insufficient documentation

## 2017-03-17 DIAGNOSIS — M47812 Spondylosis without myelopathy or radiculopathy, cervical region: Secondary | ICD-10-CM | POA: Diagnosis not present

## 2017-03-17 DIAGNOSIS — M7918 Myalgia, other site: Secondary | ICD-10-CM | POA: Insufficient documentation

## 2017-03-17 DIAGNOSIS — D509 Iron deficiency anemia, unspecified: Secondary | ICD-10-CM | POA: Diagnosis not present

## 2017-03-17 DIAGNOSIS — E1140 Type 2 diabetes mellitus with diabetic neuropathy, unspecified: Secondary | ICD-10-CM | POA: Diagnosis not present

## 2017-03-17 DIAGNOSIS — Z79899 Other long term (current) drug therapy: Secondary | ICD-10-CM | POA: Diagnosis not present

## 2017-03-17 DIAGNOSIS — Z881 Allergy status to other antibiotic agents status: Secondary | ICD-10-CM | POA: Insufficient documentation

## 2017-03-17 DIAGNOSIS — Z794 Long term (current) use of insulin: Secondary | ICD-10-CM | POA: Insufficient documentation

## 2017-03-17 DIAGNOSIS — K56609 Unspecified intestinal obstruction, unspecified as to partial versus complete obstruction: Secondary | ICD-10-CM | POA: Insufficient documentation

## 2017-03-17 DIAGNOSIS — G894 Chronic pain syndrome: Secondary | ICD-10-CM

## 2017-03-17 DIAGNOSIS — M5030 Other cervical disc degeneration, unspecified cervical region: Secondary | ICD-10-CM | POA: Insufficient documentation

## 2017-03-17 DIAGNOSIS — Z87891 Personal history of nicotine dependence: Secondary | ICD-10-CM | POA: Insufficient documentation

## 2017-03-17 DIAGNOSIS — K5090 Crohn's disease, unspecified, without complications: Secondary | ICD-10-CM | POA: Diagnosis not present

## 2017-03-17 NOTE — Patient Instructions (Addendum)
Pain Management Discharge Instructions  General Discharge Instructions :  If you need to reach your doctor call: Monday-Friday 8:00 am - 4:00 pm at [phone] or toll free [phone].  After clinic hours [phone] to have operator reach doctor.  Bring all of your medication bottles to all your appointments in the pain clinic.  To cancel or reschedule your appointment with Pain Management please remember to call 24 hours in advance to avoid a fee.  Patient to follow up as needed.GENERAL RISKS AND COMPLICATIONS  What are the risk, side effects and possible complications? Generally speaking, most procedures are safe.  However, with any procedure there are risks, side effects, and the possibility of complications.  The risks and complications are dependent upon the sites that are lesioned, or the type of nerve block to be performed.  The closer the procedure is to the spine, the more serious the risks are.  Great care is taken when placing the radio frequency needles, block needles or lesioning probes, but sometimes complications can occur. 1. Infection: Any time there is an injection through the skin, there is a risk of infection.  This is why sterile conditions are used for these blocks.  There are four possible types of infection. 1. Localized skin infection. 2. Central Nervous System Infection-This can be in the form of Meningitis, which can be deadly. 3. Epidural Infections-This can be in the form of an epidural abscess, which can cause pressure inside of the spine, causing compression of the spinal cord with subsequent paralysis. This would require an emergency surgery to decompress, and there are no guarantees that the patient would recover from the paralysis. 4. Discitis-This is an infection of the intervertebral discs.  It occurs in about 1% of discography procedures.  It is difficult to treat and it may lead to surgery.        2. Pain: the needles have to go through skin and soft  tissues, will cause soreness.       3. Damage to internal structures:  The nerves to be lesioned may be near blood vessels or    other nerves which can be potentially damaged.       4. Bleeding: Bleeding is more common if the patient is taking blood thinners such as  aspirin, Coumadin, Ticiid, Plavix, etc., or if he/she have some genetic predisposition  such as hemophilia. Bleeding into the spinal canal can cause compression of the spinal  cord with subsequent paralysis.  This would require an emergency surgery to  decompress and there are no guarantees that the patient would recover from the  paralysis.       5. Pneumothorax:  Puncturing of a lung is a possibility, every time a needle is introduced in  the area of the chest or upper back.  Pneumothorax refers to free air around the  collapsed lung(s), inside of the thoracic cavity (chest cavity).  Another two possible  complications related to a similar event would include: Hemothorax and Chylothorax.   These are variations of the Pneumothorax, where instead of air around the collapsed  lung(s), you may have blood or chyle, respectively.       6. Spinal headaches: They may occur with any procedures in the area of the spine.       7. Persistent CSF (Cerebro-Spinal Fluid) leakage: This is a rare problem, but may occur  with prolonged intrathecal or epidural catheters either due to the formation of a fistulous  track or a dural tear.  8. Nerve damage: By working so close to the spinal cord, there is always a possibility of  nerve damage, which could be as serious as a permanent spinal cord injury with  paralysis.       9. Death:  Although rare, severe deadly allergic reactions known as "Anaphylactic  reaction" can occur to any of the medications used.      10. Worsening of the symptoms:  We can always make thing worse.  What are the chances of something like this happening? Chances of any of this occuring are extremely low.  By statistics, you have  more of a chance of getting killed in a motor vehicle accident: while driving to the hospital than any of the above occurring .  Nevertheless, you should be aware that they are possibilities.  In general, it is similar to taking a shower.  Everybody knows that you can slip, hit your head and get killed.  Does that mean that you should not shower again?  Nevertheless always keep in mind that statistics do not mean anything if you happen to be on the wrong side of them.  Even if a procedure has a 1 (one) in a 1,000,000 (million) chance of going wrong, it you happen to be that one..Also, keep in mind that by statistics, you have more of a chance of having something go wrong when taking medications.  Who should not have this procedure? If you are on a blood thinning medication (e.g. Coumadin, Plavix, see list of "Blood Thinners"), or if you have an active infection going on, you should not have the procedure.  If you are taking any blood thinners, please inform your physician.  How should I prepare for this procedure?  Do not eat or drink anything at least six hours prior to the procedure.  Bring a driver with you .  It cannot be a taxi.  Come accompanied by an adult that can drive you back, and that is strong enough to help you if your legs get weak or numb from the local anesthetic.  Take all of your medicines the morning of the procedure with just enough water to swallow them.  If you have diabetes, make sure that you are scheduled to have your procedure done first thing in the morning, whenever possible.  If you have diabetes, take only half of your insulin dose and notify our nurse that you have done so as soon as you arrive at the clinic.  If you are diabetic, but only take blood sugar pills (oral hypoglycemic), then do not take them on the morning of your procedure.  You may take them after you have had the procedure.  Do not take aspirin or any aspirin-containing medications, at least eleven  (11) days prior to the procedure.  They may prolong bleeding.  Wear loose fitting clothing that may be easy to take off and that you would not mind if it got stained with Betadine or blood.  Do not wear any jewelry or perfume  Remove any nail coloring.  It will interfere with some of our monitoring equipment.  NOTE: Remember that this is not meant to be interpreted as a complete list of all possible complications.  Unforeseen problems may occur.  BLOOD THINNERS The following drugs contain aspirin or other products, which can cause increased bleeding during surgery and should not be taken for 2 weeks prior to and 1 week after surgery.  If you should need take something for relief of minor  pain, you may take acetaminophen which is found in Tylenol,m Datril, Anacin-3 and Panadol. It is not blood thinner. The products listed below are.  Do not take any of the products listed below in addition to any listed on your instruction sheet.  A.P.C or A.P.C with Codeine Codeine Phosphate Capsules #3 Ibuprofen Ridaura  ABC compound Congesprin Imuran rimadil  Advil Cope Indocin Robaxisal  Alka-Seltzer Effervescent Pain Reliever and Antacid Coricidin or Coricidin-D  Indomethacin Rufen  Alka-Seltzer plus Cold Medicine Cosprin Ketoprofen S-A-C Tablets  Anacin Analgesic Tablets or Capsules Coumadin Korlgesic Salflex  Anacin Extra Strength Analgesic tablets or capsules CP-2 Tablets Lanoril Salicylate  Anaprox Cuprimine Capsules Levenox Salocol  Anexsia-D Dalteparin Magan Salsalate  Anodynos Darvon compound Magnesium Salicylate Sine-off  Ansaid Dasin Capsules Magsal Sodium Salicylate  Anturane Depen Capsules Marnal Soma  APF Arthritis pain formula Dewitt's Pills Measurin Stanback  Argesic Dia-Gesic Meclofenamic Sulfinpyrazone  Arthritis Bayer Timed Release Aspirin Diclofenac Meclomen Sulindac  Arthritis pain formula Anacin Dicumarol Medipren Supac  Analgesic (Safety coated) Arthralgen Diffunasal Mefanamic  Suprofen  Arthritis Strength Bufferin Dihydrocodeine Mepro Compound Suprol  Arthropan liquid Dopirydamole Methcarbomol with Aspirin Synalgos  ASA tablets/Enseals Disalcid Micrainin Tagament  Ascriptin Doan's Midol Talwin  Ascriptin A/D Dolene Mobidin Tanderil  Ascriptin Extra Strength Dolobid Moblgesic Ticlid  Ascriptin with Codeine Doloprin or Doloprin with Codeine Momentum Tolectin  Asperbuf Duoprin Mono-gesic Trendar  Aspergum Duradyne Motrin or Motrin IB Triminicin  Aspirin plain, buffered or enteric coated Durasal Myochrisine Trigesic  Aspirin Suppositories Easprin Nalfon Trillsate  Aspirin with Codeine Ecotrin Regular or Extra Strength Naprosyn Uracel  Atromid-S Efficin Naproxen Ursinus  Auranofin Capsules Elmiron Neocylate Vanquish  Axotal Emagrin Norgesic Verin  Azathioprine Empirin or Empirin with Codeine Normiflo Vitamin E  Azolid Emprazil Nuprin Voltaren  Bayer Aspirin plain, buffered or children's or timed BC Tablets or powders Encaprin Orgaran Warfarin Sodium  Buff-a-Comp Enoxaparin Orudis Zorpin  Buff-a-Comp with Codeine Equegesic Os-Cal-Gesic   Buffaprin Excedrin plain, buffered or Extra Strength Oxalid   Bufferin Arthritis Strength Feldene Oxphenbutazone   Bufferin plain or Extra Strength Feldene Capsules Oxycodone with Aspirin   Bufferin with Codeine Fenoprofen Fenoprofen Pabalate or Pabalate-SF   Buffets II Flogesic Panagesic   Buffinol plain or Extra Strength Florinal or Florinal with Codeine Panwarfarin   Buf-Tabs Flurbiprofen Penicillamine   Butalbital Compound Four-way cold tablets Penicillin   Butazolidin Fragmin Pepto-Bismol   Carbenicillin Geminisyn Percodan   Carna Arthritis Reliever Geopen Persantine   Carprofen Gold's salt Persistin   Chloramphenicol Goody's Phenylbutazone   Chloromycetin Haltrain Piroxlcam   Clmetidine heparin Plaquenil   Cllnoril Hyco-pap Ponstel   Clofibrate Hydroxy chloroquine Propoxyphen         Before stopping any of these  medications, be sure to consult the physician who ordered them.  Some, such as Coumadin (Warfarin) are ordered to prevent or treat serious conditions such as "deep thrombosis", "pumonary embolisms", and other heart problems.  The amount of time that you may need off of the medication may also vary with the medication and the reason for which you were taking it.  If you are taking any of these medications, please make sure you notify your pain physician before you undergo any procedures.          Epidural Steroid Injection An epidural steroid injection is a shot of steroid medicine and numbing medicine that is given into the space between the spinal cord and the bones in your back (epidural space). The shot helps relieve pain caused by an irritated  or swollen nerve root. The amount of pain relief you get from the injection depends on what is causing the nerve to be swollen and irritated, and how long your pain lasts. You are more likely to benefit from this injection if your pain is strong and comes on suddenly rather than if you have had pain for a long time. Tell a health care provider about:  Any allergies you have.  All medicines you are taking, including vitamins, herbs, eye drops, creams, and over-the-counter medicines.  Any problems you or family members have had with anesthetic medicines.  Any blood disorders you have.  Any surgeries you have had.  Any medical conditions you have.  Whether you are pregnant or may be pregnant. What are the risks? Generally, this is a safe procedure. However, problems may occur, including:  Headache.  Bleeding.  Infection.  Allergic reaction to medicines.  Damage to your nerves.  What happens before the procedure? Staying hydrated Follow instructions from your health care provider about hydration, which may include:  Up to 2 hours before the procedure - you may continue to drink clear liquids, such as water, clear fruit juice, black  coffee, and plain tea.  Eating and drinking restrictions Follow instructions from your health care provider about eating and drinking, which may include:  8 hours before the procedure - stop eating heavy meals or foods such as meat, fried foods, or fatty foods.  6 hours before the procedure - stop eating light meals or foods, such as toast or cereal.  6 hours before the procedure - stop drinking milk or drinks that contain milk.  2 hours before the procedure - stop drinking clear liquids.  Medicine  You may be given medicines to lower anxiety.  Ask your health care provider about: ? Changing or stopping your regular medicines. This is especially important if you are taking diabetes medicines or blood thinners. ? Taking medicines such as aspirin and ibuprofen. These medicines can thin your blood. Do not take these medicines before your procedure if your health care provider instructs you not to. General instructions  Plan to have someone take you home from the hospital or clinic. What happens during the procedure?  You may receive a medicine to help you relax (sedative).  You will be asked to lie on your abdomen.  The injection site will be cleaned.  A numbing medicine (local anesthetic) will be used to numb the injection site.  A needle will be inserted through your skin into the epidural space. You may feel some discomfort when this happens. An X-ray machine will be used to make sure the needle is put as close as possible to the affected nerve.  A steroid medicine and a local anesthetic will be injected into the epidural space.  The needle will be removed.  A bandage (dressing) will be put over the injection site. What happens after the procedure?  Your blood pressure, heart rate, breathing rate, and blood oxygen level will be monitored until the medicines you were given have worn off.  Your arm or leg may feel weak or numb for a few hours.  The injection site may feel  sore.  Do not drive for 24 hours if you received a sedative. This information is not intended to replace advice given to you by your health care provider. Make sure you discuss any questions you have with your health care provider. Document Released: 07/15/2007 Document Revised: 09/19/2015 Document Reviewed: 07/24/2015 [employer] Interactive Patient Education  2017 [place].

## 2017-03-17 NOTE — Progress Notes (Signed)
Patient's Name: Caroline Hughes  MRN: [id number]  Referring Provider: Sharyne Peach, MD  DOB: May 14, 1960  PCP: Sharyne Peach, MD  DOS: 03/17/2017  Note by: Gillis Santa, MD  Service setting: Ambulatory outpatient  Specialty: Interventional Pain Management  Location: [facility] (AMB) Pain Management Facility    Patient type: Established   Primary Reason(s) for Visit: Encounter for post-procedure evaluation of chronic illness with mild to moderate exacerbation CC: Neck Pain  HPI  Caroline Hughes is a 56 y.o. year old, female patient, who comes today for a post-procedure evaluation. She has Iron deficiency anemia; Anxiety; Cervical myofascial pain syndrome; Controlled type 2 diabetes mellitus with diabetic nephropathy, with long-term current use of insulin ([place]); Fibromyalgia; DDD (degenerative disc disease), cervical; Spondylosis of cervical region without myelopathy or radiculopathy; and Chronic pain syndrome on their problem list. Her primarily concern today is the Neck Pain  Pain Assessment: Location: (bilateral) Neck Radiating: down to upper shoulders Onset: More than a month ago Duration: Chronic pain Quality: Constant, Burning, Sharp Severity: 6 /10 (self-reported pain score)  Note: Reported level is inconsistent with clinical observations. Clinically the patient looks like a 2/10 A 2/10 is viewed as "Mild to Moderate" and described as noticeable and distracting. Impossible to hide from other people. More frequent flare-ups. Still possible to adapt and function close to normal. It can be very annoying and may have occasional stronger flare-ups. With discipline, patients may get used to it and adapt.       When using our objective Pain Scale, levels between 6 and 10/10 are said to belong in an emergency room, as it progressively worsens from a 6/10, described as severely limiting, requiring emergency care not usually available at an outpatient pain management facility. At a 6/10 level,  communication becomes difficult and requires great effort. Assistance to reach the emergency department may be required. Facial flushing and profuse sweating along with potentially dangerous increases in heart rate and blood pressure will be evident. Effect on ADL: making difficult Timing: Constant Modifying factors: heat  [name] comes in today for post-procedure evaluation after the treatment done on 02/25/2017.  Further details on both, my assessment(s), as well as the proposed treatment plan, please see below.  Post-Procedure Assessment  02/25/2017 Procedure: Bilateral C4-C6 medial branch nerve blocks Pre-procedure pain score:  8/10 Post-procedure pain score: 0/10         Influential Factors: BMI: 26.34 kg/m Intra-procedural challenges: None observed.         Assessment challenges: None detected.              Reported side-effects: None.        Post-procedural adverse reactions or complications: None reported         Sedation: Please see nurses note. When no sedatives are used, the analgesic levels obtained are directly associated to the effectiveness of the local anesthetics. However, when sedation is provided, the level of analgesia obtained during the initial 1 hour following the intervention, is believed to be the result of a combination of factors. These factors may include, but are not limited to: 1. The effectiveness of the local anesthetics used. 2. The effects of the analgesic(s) and/or anxiolytic(s) used. 3. The degree of discomfort experienced by the patient at the time of the procedure. 4. The patients ability and reliability in recalling and recording the events. 5. The presence and influence of possible secondary gains and/or psychosocial factors. Reported result: Relief experienced during the 1st hour after the procedure: 100 % (  Ultra-Short Term Relief)            Interpretative annotation: Clinically appropriate result. Analgesia during this period is likely to be  Local Anesthetic and/or IV Sedative (Analgesic/Anxiolytic) related.          Effects of local anesthetic: The analgesic effects attained during this period are directly associated to the localized infiltration of local anesthetics and therefore cary significant diagnostic value as to the etiological location, or anatomical origin, of the pain. Expected duration of relief is directly dependent on the pharmacodynamics of the local anesthetic used. Long-acting (4-6 hours) anesthetics used.  Reported result: Relief during the next 4 to 6 hour after the procedure: 100 % (Short-Term Relief)            Interpretative annotation: Clinically appropriate result. Analgesia during this period is likely to be Local Anesthetic-related.          Long-term benefit: Defined as the period of time past the expected duration of local anesthetics (1 hour for short-acting and 4-6 hours for long-acting). With the possible exception of prolonged sympathetic blockade from the local anesthetics, benefits during this period are typically attributed to, or associated with, other factors such as analgesic sensory neuropraxia, antiinflammatory effects, or beneficial biochemical changes provided by agents other than the local anesthetics.  Reported result: Extended relief following procedure: 0 % (Long-Term Relief)            Interpretative annotation: Clinically appropriate result. Good relief. No permanent benefit expected. Inflammation plays a part in the etiology to the pain.          Current benefits: Defined as reported results that persistent at this point in time.   Analgesia: 0 %            Function: No benefit ROM: No benefit Interpretative annotation: Recurrence of symptoms. Therapeutic failure. Results would suggest persistent aggravating factors.          Interpretation: Results would suggest failure of therapy in achieving desired goal(s).                  Plan:  Please see "Plan of Care" for details.         Laboratory Chemistry  Inflammation Markers (CRP: Acute Phase) (ESR: Chronic Phase) Lab Results  Component Value Date   ESRSEDRATE 24 02/18/2016                 Renal Function Markers Lab Results  Component Value Date   BUN 10 12/16/2014   CREATININE 0.80 03/15/2016   GFRAA >60 12/16/2014   GFRNONAA >60 12/16/2014                 Hepatic Function Markers Lab Results  Component Value Date   AST 18 12/16/2014   ALT 12 (L) 12/16/2014   ALBUMIN 3.3 (L) 12/16/2014   ALKPHOS 92 12/16/2014                 Electrolytes Lab Results  Component Value Date   NA 138 12/16/2014   K 3.9 12/16/2014   CL 105 12/16/2014   CALCIUM 8.7 (L) 12/16/2014   MG 1.8 11/07/2013                 Neuropathy Markers Lab Results  Component Value Date   VITAMINB12 785 02/18/2016                 Bone Pathology Markers Lab Results  Component Value Date   ALKPHOS 92 12/16/2014  CALCIUM 8.7 (L) 12/16/2014                 Rheumatology Markers No results found for: LABURIC, URICUR              Coagulation Parameters Lab Results  Component Value Date   INR 0.8 07/09/2013   LABPROT 11.0 (L) 07/09/2013   PLT 360 05/19/2016                 Cardiovascular Markers Lab Results  Component Value Date   CKTOTAL 98 07/09/2013   CKMB 2.1 07/09/2013   TROPONINI < 0.02 01/16/2014   HGB 14.0 05/19/2016   HCT 41.1 05/19/2016                 CA Markers No results found for: CEA, CA125, LABCA2               Note: Lab results reviewed.   Meds   Current Outpatient Medications:  .  albuterol (PROVENTIL HFA;VENTOLIN HFA) 108 (90 BASE) MCG/ACT inhaler, Inhale 2 puffs into the lungs every 6 (six) hours as needed for wheezing or shortness of breath., Disp: , Rfl:  .  albuterol (PROVENTIL) (2.5 MG/3ML) 0.083% nebulizer solution, Inhale 3 mLs into the lungs every 6 (six) hours as needed for shortness of breath., Disp: , Rfl:  .  baclofen (LIORESAL) 10 MG tablet, Take 10 mg by mouth at bedtime. ,  Disp: , Rfl:  .  Certolizumab Pegol (CIMZIA) 2 X 200 MG KIT, Inject 400 mg into the skin every 30 (thirty) days. Once a month on the 13th, Disp: , Rfl:  .  cyanocobalamin (,VITAMIN B-12,) 1000 MCG/ML injection, Inject 1,000 mcg into the muscle every 30 (thirty) days. , Disp: , Rfl:  .  dicyclomine (BENTYL) 10 MG capsule, Take 10 mg by mouth every morning. , Disp: , Rfl:  .  DUREZOL 0.05 % EMUL, Place 1 drop into the right eye 2 (two) times daily., Disp: , Rfl:  .  gabapentin (NEURONTIN) 800 MG tablet, Take 800 mg by mouth 3 (three) times daily as needed (pain). , Disp: , Rfl:  .  hydrOXYzine (ATARAX/VISTARIL) 25 MG tablet, Take 25 mg by mouth every 8 (eight) hours as needed for anxiety. , Disp: , Rfl:  .  hyoscyamine (LEVSIN, ANASPAZ) 0.125 MG tablet, Take 0.125 mg by mouth every 4 (four) hours as needed for bladder spasms or cramping. , Disp: , Rfl:  .  insulin detemir (LEVEMIR) 100 UNIT/ML injection, Inject 45 Units into the skin at bedtime., Disp: , Rfl:  .  metaxalone (SKELAXIN) 800 MG tablet, Take 800 mg by mouth daily as needed for muscle spasms., Disp: , Rfl:  .  Milnacipran (SAVELLA) 50 MG TABS tablet, Take 50 mg by mouth 2 (two) times daily., Disp: , Rfl:  .  montelukast (SINGULAIR) 10 MG tablet, Take 10 mg by mouth at bedtime. , Disp: , Rfl:  .  moxifloxacin (VIGAMOX) 0.5 % ophthalmic solution, Place 1 drop into the right eye 4 (four) times daily., Disp: , Rfl:  .  ondansetron (ZOFRAN-ODT) 8 MG disintegrating tablet, Take 8 mg by mouth 2 (two) times daily as needed for nausea/vomiting., Disp: , Rfl:  .  ranitidine (ZANTAC) 150 MG capsule, Take 150 mg by mouth daily as needed for heartburn., Disp: , Rfl:  .  sucralfate (CARAFATE) 1 GM/10ML suspension, Take 1 g by mouth 4 (four) times daily -  with meals and at bedtime., Disp: , Rfl:  .  SUMAtriptan (IMITREX) 50 MG tablet, Take 50 mg by mouth every 2 (two) hours as needed for migraine. May repeat in 2 hours if headache persists or recurs.,  Disp: , Rfl:  .  tiZANidine (ZANAFLEX) 4 MG tablet, Take 4 mg by mouth daily as needed for muscle spasms., Disp: , Rfl:  .  traMADol (ULTRAM) 50 MG tablet, Take 50 mg by mouth every 6 (six) hours as needed for moderate pain. , Disp: , Rfl:  .  traZODone (DESYREL) 100 MG tablet, Take 200 mg by mouth at bedtime. , Disp: , Rfl:  .  LORazepam (ATIVAN) 0.5 MG tablet, Take 0.5 mg by mouth at bedtime as needed for anxiety or sleep. , Disp: , Rfl:  .  nepafenac (ILEVRO) 0.3 % ophthalmic suspension, Place 1 drop into the right eye at bedtime., Disp: , Rfl:  .  Pancrelipase, Lip-Prot-Amyl, 25000 units CPEP, Take 2 capsules by mouth 3 (three) times daily with meals. , Disp: , Rfl:   ROS  Constitutional: Denies any fever or chills Gastrointestinal: No reported hemesis, hematochezia, vomiting, or acute GI distress Musculoskeletal: Denies any acute onset joint swelling, redness, loss of ROM, or weakness Neurological: No reported episodes of acute onset apraxia, aphasia, dysarthria, agnosia, amnesia, paralysis, loss of coordination, or loss of consciousness  Allergies  Ms. Aday is allergic to naproxen; penicillins; gluten meal; lactose intolerance (gi); latex; other; shellfish allergy; soy allergy; sulfonylureas; doxycycline; eggs or egg-derived products; humira [adalimumab]; peanut-containing drug products; simvastatin; statins; sulfa antibiotics; and vancomycin.  [place]  Drug: Ms. Bundrick  reports that she does not use drugs. Alcohol:  reports that she drinks alcohol. Tobacco:  reports that she has quit smoking. she has never used smokeless tobacco. Medical:  has a past medical history of Anemia, Anxiety, Arthritis, Asthma, Bronchitis ([place]), Complication of anesthesia, Crohn's disease ([place]), Depression, Diabetes mellitus without complication ([place]), Endometriosis, Fibromyalgia, Gastric ulcer, GERD (gastroesophageal reflux disease), Herniated disc, cervical, Hyperlipemia, Intestinal obstruction ([place]),  Migraine, Nausea, Neuropathy, Pain, Peristomal hernia, PONV (postoperative nausea and vomiting), and Ulcerative colitis ([place]). Surgical: Ms. Yontz  has a past surgical history that includes Abdominal hysterectomy; rectal fistula packing; Ileostomy (Bilateral); Hernia repair; Cholecystectomy; Cataract extraction w/PHACO (Right, 11/25/2016); Colon resection; and Cataract extraction w/PHACO (Left, 12/02/2016). Family: family history is not on file.  Constitutional Exam  General appearance: Well nourished, well developed, and well hydrated. In no apparent acute distress Vitals:   03/17/17 1148  BP: (!) 156/94  Pulse: 91  Resp: 20  Temp: 97.6 F (36.4 C)  TempSrc: Oral  SpO2: 98%  Weight: 144 lb (65.3 kg)  Height: _0  (1.575 m)   BMI Assessment: Estimated body mass index is 26.34 kg/m as calculated from the following:   Height as of this encounter: _1  (1.575 m).   Weight as of this encounter: 144 lb (65.3 kg).  BMI interpretation table: BMI level Category Range association with higher incidence of chronic pain  <18 kg/m2 Underweight   18.5-24.9 kg/m2 Ideal body weight   25-29.9 kg/m2 Overweight Increased incidence by 20%  30-34.9 kg/m2 Obese (Class I) Increased incidence by 68%  35-39.9 kg/m2 Severe obesity (Class II) Increased incidence by 136%  >40 kg/m2 Extreme obesity (Class III) Increased incidence by 254%   BMI Readings from Last 4 Encounters:  03/17/17 26.34 kg/m  02/25/17 26.52 kg/m  02/18/17 26.52 kg/m  12/02/16 26.52 kg/m   Wt Readings from Last 4 Encounters:  03/17/17 144 lb (65.3 kg)  02/25/17 145 lb (65.8 kg)  02/18/17  145 lb (65.8 kg)  12/02/16 145 lb (65.8 kg)  Psych/Mental status: Alert, oriented x 3 (person, place, & time)       Eyes: PERLA Respiratory: No evidence of acute respiratory distress  Cervical Spine Area Exam  Skin & Axial Inspection: No masses, redness, edema, swelling, or associated skin lesions Alignment: Symmetrical Functional ROM:  Unrestricted ROM      Stability: No instability detected Muscle Tone/Strength: Functionally intact. No obvious neuro-muscular anomalies detected. Sensory (Neurological): Unimpaired Palpation: No palpable anomalies              Upper Extremity (UE) Exam    Side: Right upper extremity  Side: Left upper extremity  Skin & Extremity Inspection: Skin color, temperature, and hair growth are WNL. No peripheral edema or cyanosis. No masses, redness, swelling, asymmetry, or associated skin lesions. No contractures.  Skin & Extremity Inspection: Skin color, temperature, and hair growth are WNL. No peripheral edema or cyanosis. No masses, redness, swelling, asymmetry, or associated skin lesions. No contractures.  Functional ROM: Unrestricted ROM          Functional ROM: Unrestricted ROM          Muscle Tone/Strength: Functionally intact. No obvious neuro-muscular anomalies detected.  Muscle Tone/Strength: Functionally intact. No obvious neuro-muscular anomalies detected.  Sensory (Neurological): Unimpaired          Sensory (Neurological): Unimpaired          Palpation: No palpable anomalies              Palpation: No palpable anomalies              Specialized Test(s): Deferred         Specialized Test(s): Deferred          Thoracic Spine Area Exam  Skin & Axial Inspection: No masses, redness, or swelling Alignment: Symmetrical Functional ROM: Unrestricted ROM Stability: No instability detected Muscle Tone/Strength: Functionally intact. No obvious neuro-muscular anomalies detected. Sensory (Neurological): Unimpaired Muscle strength & Tone: No palpable anomalies  Lumbar Spine Area Exam  Skin & Axial Inspection: No masses, redness, or swelling Alignment: Symmetrical Functional ROM: Unrestricted ROM      Stability: No instability detected Muscle Tone/Strength: Functionally intact. No obvious neuro-muscular anomalies detected. Sensory (Neurological): Unimpaired Palpation: No palpable anomalies         Provocative Tests: Lumbar Hyperextension and rotation test: evaluation deferred today       Lumbar Lateral bending test: evaluation deferred today       Patrick's Maneuver: evaluation deferred today                    Gait & Posture Assessment  Ambulation: Unassisted Gait: Relatively normal for age and body habitus Posture: WNL   Lower Extremity Exam    Side: Right lower extremity  Side: Left lower extremity  Skin & Extremity Inspection: Skin color, temperature, and hair growth are WNL. No peripheral edema or cyanosis. No masses, redness, swelling, asymmetry, or associated skin lesions. No contractures.  Skin & Extremity Inspection: Skin color, temperature, and hair growth are WNL. No peripheral edema or cyanosis. No masses, redness, swelling, asymmetry, or associated skin lesions. No contractures.  Functional ROM: Unrestricted ROM          Functional ROM: Unrestricted ROM          Muscle Tone/Strength: Functionally intact. No obvious neuro-muscular anomalies detected.  Muscle Tone/Strength: Functionally intact. No obvious neuro-muscular anomalies detected.  Sensory (Neurological): Unimpaired  Sensory (Neurological): Unimpaired  Palpation:  No palpable anomalies  Palpation: No palpable anomalies   Assessment  Primary Diagnosis & Pertinent Problem List: The primary encounter diagnosis was Chronic pain syndrome. Diagnoses of Fibromyalgia and Spondylosis of cervical region without myelopathy or radiculopathy were also pertinent to this visit.  Status Diagnosis  Persistent Persistent Persistent 1. Chronic pain syndrome   2. Fibromyalgia   3. Spondylosis of cervical region without myelopathy or radiculopathy      56 year old female with a history of axial neck pain secondary to cervical spondylosis status post bilateral C4-C6 medial branch nerve blocks who presents for follow-up.  Patient states that no significant pain benefit obtained beyond 5 hours on procedure day.  This would suggest a  negative diagnostic block and therefore do not recommend continuing with subsequent diagnostic blocks or radiofrequency ablation.  We had an extensive discussion about further treatment options and patient has tried various medications for her fibromyalgia including Lyrica, gabapentin, Cymbalta along with aquatic therapy and physical therapy none of which have been very effective.  I told the patient that if she should have any new onset pain symptoms or would like to discuss any new therapy options in the future, she is welcome to contact the clinic and schedule an appointment but at the time I do not have any additional therapeutic options including interventional therapies or pharmacotherapy for the patient.  Provider-requested follow-up: Return if symptoms worsen or fail to improve.  No future appointments.  Primary Care Physician: Sharyne Peach, MD Location: [facility] Note by: Gillis Santa, M.D Date: 03/17/2017; Time: 1:48 PM  Patient Instructions   Pain Management Discharge Instructions  General Discharge Instructions :  If you need to reach your doctor call: Monday-Friday 8:00 am - 4:00 pm at [phone] or toll free [phone].  After clinic hours [phone] to have operator reach doctor.  Bring all of your medication bottles to all your appointments in the pain clinic.  To cancel or reschedule your appointment with Pain Management please remember to call 24 hours in advance to avoid a fee.  Patient to follow up as needed.GENERAL RISKS AND COMPLICATIONS  What are the risk, side effects and possible complications? Generally speaking, most procedures are safe.  However, with any procedure there are risks, side effects, and the possibility of complications.  The risks and complications are dependent upon the sites that are lesioned, or the type of nerve block to be performed.  The closer the procedure is to the spine, the more serious the risks  are.  Great care is taken when placing the radio frequency needles, block needles or lesioning probes, but sometimes complications can occur. 1. Infection: Any time there is an injection through the skin, there is a risk of infection.  This is why sterile conditions are used for these blocks.  There are four possible types of infection. 1. Localized skin infection. 2. Central Nervous System Infection-This can be in the form of Meningitis, which can be deadly. 3. Epidural Infections-This can be in the form of an epidural abscess, which can cause pressure inside of the spine, causing compression of the spinal cord with subsequent paralysis. This would require an emergency surgery to decompress, and there are no guarantees that the patient would recover from the paralysis. 4. Discitis-This is an infection of the intervertebral discs.  It occurs in about 1% of discography procedures.  It is difficult to treat and it may lead to surgery.        2. Pain: the needles  have to go through skin and soft tissues, will cause soreness.       3. Damage to internal structures:  The nerves to be lesioned may be near blood vessels or    other nerves which can be potentially damaged.       4. Bleeding: Bleeding is more common if the patient is taking blood thinners such as  aspirin, Coumadin, Ticiid, Plavix, etc., or if he/she have some genetic predisposition  such as hemophilia. Bleeding into the spinal canal can cause compression of the spinal  cord with subsequent paralysis.  This would require an emergency surgery to  decompress and there are no guarantees that the patient would recover from the  paralysis.       5. Pneumothorax:  Puncturing of a lung is a possibility, every time a needle is introduced in  the area of the chest or upper back.  Pneumothorax refers to free air around the  collapsed lung(s), inside of the thoracic cavity (chest cavity).  Another two possible  complications related to a similar event would  include: Hemothorax and Chylothorax.   These are variations of the Pneumothorax, where instead of air around the collapsed  lung(s), you may have blood or chyle, respectively.       6. Spinal headaches: They may occur with any procedures in the area of the spine.       7. Persistent CSF (Cerebro-Spinal Fluid) leakage: This is a rare problem, but may occur  with prolonged intrathecal or epidural catheters either due to the formation of a fistulous  track or a dural tear.       8. Nerve damage: By working so close to the spinal cord, there is always a possibility of  nerve damage, which could be as serious as a permanent spinal cord injury with  paralysis.       9. Death:  Although rare, severe deadly allergic reactions known as "Anaphylactic  reaction" can occur to any of the medications used.      10. Worsening of the symptoms:  We can always make thing worse.  What are the chances of something like this happening? Chances of any of this occuring are extremely low.  By statistics, you have more of a chance of getting killed in a motor vehicle accident: while driving to the hospital than any of the above occurring .  Nevertheless, you should be aware that they are possibilities.  In general, it is similar to taking a shower.  Everybody knows that you can slip, hit your head and get killed.  Does that mean that you should not shower again?  Nevertheless always keep in mind that statistics do not mean anything if you happen to be on the wrong side of them.  Even if a procedure has a 1 (one) in a 1,000,000 (million) chance of going wrong, it you happen to be that one..Also, keep in mind that by statistics, you have more of a chance of having something go wrong when taking medications.  Who should not have this procedure? If you are on a blood thinning medication (e.g. Coumadin, Plavix, see list of "Blood Thinners"), or if you have an active infection going on, you should not have the procedure.  If you are  taking any blood thinners, please inform your physician.  How should I prepare for this procedure?  Do not eat or drink anything at least six hours prior to the procedure.  Bring a driver with you .  It cannot be a taxi.  Come accompanied by an adult that can drive you back, and that is strong enough to help you if your legs get weak or numb from the local anesthetic.  Take all of your medicines the morning of the procedure with just enough water to swallow them.  If you have diabetes, make sure that you are scheduled to have your procedure done first thing in the morning, whenever possible.  If you have diabetes, take only half of your insulin dose and notify our nurse that you have done so as soon as you arrive at the clinic.  If you are diabetic, but only take blood sugar pills (oral hypoglycemic), then do not take them on the morning of your procedure.  You may take them after you have had the procedure.  Do not take aspirin or any aspirin-containing medications, at least eleven (11) days prior to the procedure.  They may prolong bleeding.  Wear loose fitting clothing that may be easy to take off and that you would not mind if it got stained with Betadine or blood.  Do not wear any jewelry or perfume  Remove any nail coloring.  It will interfere with some of our monitoring equipment.  NOTE: Remember that this is not meant to be interpreted as a complete list of all possible complications.  Unforeseen problems may occur.  BLOOD THINNERS The following drugs contain aspirin or other products, which can cause increased bleeding during surgery and should not be taken for 2 weeks prior to and 1 week after surgery.  If you should need take something for relief of minor pain, you may take acetaminophen which is found in Tylenol,m Datril, Anacin-3 and Panadol. It is not blood thinner. The products listed below are.  Do not take any of the products listed below in addition to any listed on  your instruction sheet.  A.P.C or A.P.C with Codeine Codeine Phosphate Capsules #3 Ibuprofen Ridaura  ABC compound Congesprin Imuran rimadil  Advil Cope Indocin Robaxisal  Alka-Seltzer Effervescent Pain Reliever and Antacid Coricidin or Coricidin-D  Indomethacin Rufen  Alka-Seltzer plus Cold Medicine Cosprin Ketoprofen S-A-C Tablets  Anacin Analgesic Tablets or Capsules Coumadin Korlgesic Salflex  Anacin Extra Strength Analgesic tablets or capsules CP-2 Tablets Lanoril Salicylate  Anaprox Cuprimine Capsules Levenox Salocol  Anexsia-D Dalteparin Magan Salsalate  Anodynos Darvon compound Magnesium Salicylate Sine-off  Ansaid Dasin Capsules Magsal Sodium Salicylate  Anturane Depen Capsules Marnal Soma  APF Arthritis pain formula Dewitt's Pills Measurin Stanback  Argesic Dia-Gesic Meclofenamic Sulfinpyrazone  Arthritis Bayer Timed Release Aspirin Diclofenac Meclomen Sulindac  Arthritis pain formula Anacin Dicumarol Medipren Supac  Analgesic (Safety coated) Arthralgen Diffunasal Mefanamic Suprofen  Arthritis Strength Bufferin Dihydrocodeine Mepro Compound Suprol  Arthropan liquid Dopirydamole Methcarbomol with Aspirin Synalgos  ASA tablets/Enseals Disalcid Micrainin Tagament  Ascriptin Doan's Midol Talwin  Ascriptin A/D Dolene Mobidin Tanderil  Ascriptin Extra Strength Dolobid Moblgesic Ticlid  Ascriptin with Codeine Doloprin or Doloprin with Codeine Momentum Tolectin  Asperbuf Duoprin Mono-gesic Trendar  Aspergum Duradyne Motrin or Motrin IB Triminicin  Aspirin plain, buffered or enteric coated Durasal Myochrisine Trigesic  Aspirin Suppositories Easprin Nalfon Trillsate  Aspirin with Codeine Ecotrin Regular or Extra Strength Naprosyn Uracel  Atromid-S Efficin Naproxen Ursinus  Auranofin Capsules Elmiron Neocylate Vanquish  Axotal Emagrin Norgesic Verin  Azathioprine Empirin or Empirin with Codeine Normiflo Vitamin E  Azolid Emprazil Nuprin Voltaren  Bayer Aspirin plain, buffered or  children's or timed BC Tablets or powders Encaprin Orgaran Warfarin Sodium  Buff-a-Comp Enoxaparin Orudis Zorpin  Buff-a-Comp with Codeine Equegesic Os-Cal-Gesic   Buffaprin Excedrin plain, buffered or Extra Strength Oxalid   Bufferin Arthritis Strength Feldene Oxphenbutazone   Bufferin plain or Extra Strength Feldene Capsules Oxycodone with Aspirin   Bufferin with Codeine Fenoprofen Fenoprofen Pabalate or Pabalate-SF   Buffets II Flogesic Panagesic   Buffinol plain or Extra Strength Florinal or Florinal with Codeine Panwarfarin   Buf-Tabs Flurbiprofen Penicillamine   Butalbital Compound Four-way cold tablets Penicillin   Butazolidin Fragmin Pepto-Bismol   Carbenicillin Geminisyn Percodan   Carna Arthritis Reliever Geopen Persantine   Carprofen Gold's salt Persistin   Chloramphenicol Goody's Phenylbutazone   Chloromycetin Haltrain Piroxlcam   Clmetidine heparin Plaquenil   Cllnoril Hyco-pap Ponstel   Clofibrate Hydroxy chloroquine Propoxyphen         Before stopping any of these medications, be sure to consult the physician who ordered them.  Some, such as Coumadin (Warfarin) are ordered to prevent or treat serious conditions such as "deep thrombosis", "pumonary embolisms", and other heart problems.  The amount of time that you may need off of the medication may also vary with the medication and the reason for which you were taking it.  If you are taking any of these medications, please make sure you notify your pain physician before you undergo any procedures.          Epidural Steroid Injection An epidural steroid injection is a shot of steroid medicine and numbing medicine that is given into the space between the spinal cord and the bones in your back (epidural space). The shot helps relieve pain caused by an irritated or swollen nerve root. The amount of pain relief you get from the injection depends on what is causing the nerve to be swollen and irritated, and how long your  pain lasts. You are more likely to benefit from this injection if your pain is strong and comes on suddenly rather than if you have had pain for a long time. Tell a health care provider about:  Any allergies you have.  All medicines you are taking, including vitamins, herbs, eye drops, creams, and over-the-counter medicines.  Any problems you or family members have had with anesthetic medicines.  Any blood disorders you have.  Any surgeries you have had.  Any medical conditions you have.  Whether you are pregnant or may be pregnant. What are the risks? Generally, this is a safe procedure. However, problems may occur, including:  Headache.  Bleeding.  Infection.  Allergic reaction to medicines.  Damage to your nerves.  What happens before the procedure? Staying hydrated Follow instructions from your health care provider about hydration, which may include:  Up to 2 hours before the procedure - you may continue to drink clear liquids, such as water, clear fruit juice, black coffee, and plain tea.  Eating and drinking restrictions Follow instructions from your health care provider about eating and drinking, which may include:  8 hours before the procedure - stop eating heavy meals or foods such as meat, fried foods, or fatty foods.  6 hours before the procedure - stop eating light meals or foods, such as toast or cereal.  6 hours before the procedure - stop drinking milk or drinks that contain milk.  2 hours before the procedure - stop drinking clear liquids.  Medicine  You may be given medicines to lower anxiety.  Ask your health care provider about: ? Changing or stopping your regular medicines. This is especially important if you are  taking diabetes medicines or blood thinners. ? Taking medicines such as aspirin and ibuprofen. These medicines can thin your blood. Do not take these medicines before your procedure if your health care provider instructs you not  to. General instructions  Plan to have someone take you home from the hospital or clinic. What happens during the procedure?  You may receive a medicine to help you relax (sedative).  You will be asked to lie on your abdomen.  The injection site will be cleaned.  A numbing medicine (local anesthetic) will be used to numb the injection site.  A needle will be inserted through your skin into the epidural space. You may feel some discomfort when this happens. An X-ray machine will be used to make sure the needle is put as close as possible to the affected nerve.  A steroid medicine and a local anesthetic will be injected into the epidural space.  The needle will be removed.  A bandage (dressing) will be put over the injection site. What happens after the procedure?  Your blood pressure, heart rate, breathing rate, and blood oxygen level will be monitored until the medicines you were given have worn off.  Your arm or leg may feel weak or numb for a few hours.  The injection site may feel sore.  Do not drive for 24 hours if you received a sedative. This information is not intended to replace advice given to you by your health care provider. Make sure you discuss any questions you have with your health care provider. Document Released: 07/15/2007 Document Revised: 09/19/2015 Document Reviewed: 07/24/2015 Elsevier Interactive Patient Education  2017 [employer].

## 2017-03-17 NOTE — Progress Notes (Signed)
Safety precautions to be maintained throughout the outpatient stay will include: orient to surroundings, keep bed in low position, maintain call bell within reach at all times, provide assistance with transfer out of bed and ambulation.  

## 2017-03-19 ENCOUNTER — Ambulatory Visit: Payer: Medicare Other | Admitting: Student in an Organized Health Care Education/Training Program

## 2017-04-20 DIAGNOSIS — R002 Palpitations: Secondary | ICD-10-CM | POA: Insufficient documentation

## 2017-05-27 ENCOUNTER — Other Ambulatory Visit
Admission: RE | Admit: 2017-05-27 | Discharge: 2017-05-27 | Disposition: A | Discharge status: Home / Self Care | Payer: Medicare Other | Source: Ambulatory Visit | Attending: Student | Admitting: Student

## 2017-05-27 DIAGNOSIS — K50118 Crohn's disease of large intestine with other complication: Secondary | ICD-10-CM | POA: Insufficient documentation

## 2017-05-27 LAB — GASTROINTESTINAL PANEL BY PCR, STOOL (REPLACES STOOL CULTURE)

## 2017-05-27 LAB — C DIFFICILE QUICK SCREEN W PCR REFLEX
C Diff antigen: NEGATIVE
C Diff interpretation: NOT DETECTED
C Diff toxin: NEGATIVE

## 2017-05-29 LAB — CALPROTECTIN, FECAL: Calprotectin, Fecal: 238 ug/g — ABNORMAL HIGH (ref 0–120)

## 2017-06-15 ENCOUNTER — Other Ambulatory Visit: Payer: Self-pay

## 2017-06-15 ENCOUNTER — Inpatient Hospital Stay
Admission: EM | Admit: 2017-06-15 | Discharge: 2017-06-26 | DRG: 378 | Disposition: A | Discharge status: Home Health | Payer: Medicare Other | Attending: Internal Medicine | Admitting: Internal Medicine

## 2017-06-15 ENCOUNTER — Emergency Department: Payer: Medicare Other

## 2017-06-15 ENCOUNTER — Encounter: Payer: Self-pay | Admitting: Emergency Medicine

## 2017-06-15 DIAGNOSIS — Z932 Ileostomy status: Secondary | ICD-10-CM

## 2017-06-15 DIAGNOSIS — K921 Melena: Secondary | ICD-10-CM | POA: Diagnosis present

## 2017-06-15 DIAGNOSIS — Z961 Presence of intraocular lens: Secondary | ICD-10-CM | POA: Diagnosis present

## 2017-06-15 DIAGNOSIS — D62 Acute posthemorrhagic anemia: Secondary | ICD-10-CM | POA: Diagnosis present

## 2017-06-15 DIAGNOSIS — Z91013 Allergy to seafood: Secondary | ICD-10-CM

## 2017-06-15 DIAGNOSIS — K219 Gastro-esophageal reflux disease without esophagitis: Secondary | ICD-10-CM | POA: Diagnosis present

## 2017-06-15 DIAGNOSIS — Z888 Allergy status to other drugs, medicaments and biological substances status: Secondary | ICD-10-CM

## 2017-06-15 DIAGNOSIS — Z886 Allergy status to analgesic agent status: Secondary | ICD-10-CM

## 2017-06-15 DIAGNOSIS — K5090 Crohn's disease, unspecified, without complications: Secondary | ICD-10-CM | POA: Diagnosis present

## 2017-06-15 DIAGNOSIS — Z881 Allergy status to other antibiotic agents status: Secondary | ICD-10-CM | POA: Diagnosis not present

## 2017-06-15 DIAGNOSIS — Z794 Long term (current) use of insulin: Secondary | ICD-10-CM

## 2017-06-15 DIAGNOSIS — M797 Fibromyalgia: Secondary | ICD-10-CM | POA: Diagnosis present

## 2017-06-15 DIAGNOSIS — F419 Anxiety disorder, unspecified: Secondary | ICD-10-CM | POA: Diagnosis present

## 2017-06-15 DIAGNOSIS — E1140 Type 2 diabetes mellitus with diabetic neuropathy, unspecified: Secondary | ICD-10-CM | POA: Diagnosis present

## 2017-06-15 DIAGNOSIS — Z79899 Other long term (current) drug therapy: Secondary | ICD-10-CM | POA: Diagnosis not present

## 2017-06-15 DIAGNOSIS — Z91012 Allergy to eggs: Secondary | ICD-10-CM | POA: Diagnosis not present

## 2017-06-15 DIAGNOSIS — Z90710 Acquired absence of both cervix and uterus: Secondary | ICD-10-CM

## 2017-06-15 DIAGNOSIS — F32A Depression, unspecified: Secondary | ICD-10-CM | POA: Diagnosis present

## 2017-06-15 DIAGNOSIS — Z8249 Family history of ischemic heart disease and other diseases of the circulatory system: Secondary | ICD-10-CM

## 2017-06-15 DIAGNOSIS — E785 Hyperlipidemia, unspecified: Secondary | ICD-10-CM | POA: Diagnosis present

## 2017-06-15 DIAGNOSIS — Z531 Procedure and treatment not carried out because of patient's decision for reasons of belief and group pressure: Secondary | ICD-10-CM | POA: Diagnosis present

## 2017-06-15 DIAGNOSIS — G8929 Other chronic pain: Secondary | ICD-10-CM | POA: Diagnosis present

## 2017-06-15 DIAGNOSIS — K264 Chronic or unspecified duodenal ulcer with hemorrhage: Secondary | ICD-10-CM | POA: Diagnosis present

## 2017-06-15 DIAGNOSIS — Z933 Colostomy status: Secondary | ICD-10-CM | POA: Diagnosis not present

## 2017-06-15 DIAGNOSIS — E1121 Type 2 diabetes mellitus with diabetic nephropathy: Secondary | ICD-10-CM | POA: Diagnosis present

## 2017-06-15 DIAGNOSIS — Z91040 Latex allergy status: Secondary | ICD-10-CM | POA: Diagnosis not present

## 2017-06-15 DIAGNOSIS — Z87891 Personal history of nicotine dependence: Secondary | ICD-10-CM

## 2017-06-15 DIAGNOSIS — Z8349 Family history of other endocrine, nutritional and metabolic diseases: Secondary | ICD-10-CM

## 2017-06-15 DIAGNOSIS — K50018 Crohn's disease of small intestine with other complication: Secondary | ICD-10-CM | POA: Diagnosis not present

## 2017-06-15 DIAGNOSIS — Z9049 Acquired absence of other specified parts of digestive tract: Secondary | ICD-10-CM

## 2017-06-15 DIAGNOSIS — Z880 Allergy status to penicillin: Secondary | ICD-10-CM | POA: Diagnosis not present

## 2017-06-15 DIAGNOSIS — Z91018 Allergy to other foods: Secondary | ICD-10-CM

## 2017-06-15 DIAGNOSIS — K922 Gastrointestinal hemorrhage, unspecified: Secondary | ICD-10-CM

## 2017-06-15 DIAGNOSIS — F329 Major depressive disorder, single episode, unspecified: Secondary | ICD-10-CM | POA: Diagnosis present

## 2017-06-15 DIAGNOSIS — K269 Duodenal ulcer, unspecified as acute or chronic, without hemorrhage or perforation: Secondary | ICD-10-CM | POA: Diagnosis not present

## 2017-06-15 DIAGNOSIS — R000 Tachycardia, unspecified: Secondary | ICD-10-CM | POA: Diagnosis not present

## 2017-06-15 LAB — CBC WITH DIFFERENTIAL/PLATELET
Basophils Absolute: 0.1 10*3/uL (ref 0–0.1)
Basophils Relative: 1 %
EOS ABS: 0 10*3/uL (ref 0–0.7)
Eosinophils Relative: 0 %
HEMATOCRIT: 37.1 % (ref 35.0–47.0)
HEMOGLOBIN: 12.4 g/dL (ref 12.0–16.0)
Lymphocytes Relative: 6 %
Lymphs Abs: 0.8 10*3/uL — ABNORMAL LOW (ref 1.0–3.6)
MCH: 32.1 pg (ref 26.0–34.0)
MCHC: 33.5 g/dL (ref 32.0–36.0)
MCV: 96.1 fL (ref 80.0–100.0)
MONOS PCT: 3 %
Monocytes Absolute: 0.4 10*3/uL (ref 0.2–0.9)
NEUTROS ABS: 12.1 10*3/uL — AB (ref 1.4–6.5)
NEUTROS PCT: 90 %
Platelets: 355 10*3/uL (ref 150–440)
RBC: 3.87 MIL/uL (ref 3.80–5.20)
RDW: 12.7 % (ref 11.5–14.5)
WBC: 13.5 10*3/uL — ABNORMAL HIGH (ref 3.6–11.0)

## 2017-06-15 LAB — COMPREHENSIVE METABOLIC PANEL
ALBUMIN: 3.7 g/dL (ref 3.5–5.0)
ALK PHOS: 85 U/L (ref 38–126)
ALT: 16 U/L (ref 14–54)
ANION GAP: 12 (ref 5–15)
AST: 32 U/L (ref 15–41)
BUN: 27 mg/dL — AB (ref 6–20)
CALCIUM: 8.6 mg/dL — AB (ref 8.9–10.3)
CO2: 22 mmol/L (ref 22–32)
Chloride: 99 mmol/L — ABNORMAL LOW (ref 101–111)
Creatinine, Ser: 0.92 mg/dL (ref 0.44–1.00)
GFR calc Af Amer: >60 mL/min (ref >60)
GFR calc non Af Amer: >60 mL/min (ref >60)
GLUCOSE: 445 mg/dL — AB (ref 65–99)
Potassium: 4.6 mmol/L (ref 3.5–5.1)
SODIUM: 133 mmol/L — AB (ref 135–145)
Total Bilirubin: 0.2 mg/dL — ABNORMAL LOW (ref 0.3–1.2)
Total Protein: 6.9 g/dL (ref 6.5–8.1)

## 2017-06-15 LAB — URINALYSIS, COMPLETE (UACMP) WITH MICROSCOPIC
BACTERIA UA: NONE SEEN
BILIRUBIN URINE: NEGATIVE
Glucose, UA: >=500 mg/dL — AB
HGB URINE DIPSTICK: NEGATIVE
Ketones, ur: 5 mg/dL — AB
LEUKOCYTES UA: NEGATIVE
NITRITE: NEGATIVE
PROTEIN: NEGATIVE mg/dL
RBC / HPF: NONE SEEN RBC/hpf (ref 0–5)
SQUAMOUS EPITHELIAL / LPF: NONE SEEN
Specific Gravity, Urine: 1.022 (ref 1.005–1.030)
WBC UA: NONE SEEN WBC/hpf (ref 0–5)
pH: 5 (ref 5.0–8.0)

## 2017-06-15 LAB — PROTIME-INR
INR: 0.81
PROTHROMBIN TIME: 11.1 s — AB (ref 11.4–15.2)

## 2017-06-15 LAB — APTT: aPTT: 31 seconds (ref 24–36)

## 2017-06-15 LAB — LIPASE, BLOOD: Lipase: 33 U/L (ref 11–51)

## 2017-06-15 MED ORDER — SODIUM CHLORIDE 0.9 % IV BOLUS (SEPSIS)
1000.0000 mL | Freq: Once | INTRAVENOUS | Status: AC
  Administered 2017-06-15: 1000 mL via INTRAVENOUS

## 2017-06-15 MED ORDER — HYDROXYZINE HCL 25 MG PO TABS
25.0000 mg | ORAL_TABLET | Freq: Three times a day (TID) | ORAL | Status: DC | PRN
  Administered 2017-06-18 – 2017-06-20 (×2): 25 mg via ORAL
  Filled 2017-06-15 (×3): qty 1

## 2017-06-15 MED ORDER — SODIUM CHLORIDE 0.9 % IV SOLN
INTRAVENOUS | Status: DC
  Administered 2017-06-15: 23:00:00 via INTRAVENOUS

## 2017-06-15 MED ORDER — SODIUM CHLORIDE 0.9 % IV SOLN
80.0000 mg | Freq: Once | INTRAVENOUS | Status: AC
  Administered 2017-06-15: 23:00:00 80 mg via INTRAVENOUS
  Filled 2017-06-15: qty 80

## 2017-06-15 MED ORDER — GABAPENTIN 800 MG PO TABS
800.0000 mg | ORAL_TABLET | Freq: Three times a day (TID) | ORAL | Status: DC | PRN
  Filled 2017-06-15: qty 1

## 2017-06-15 MED ORDER — MORPHINE SULFATE (PF) 2 MG/ML IV SOLN
2.0000 mg | INTRAVENOUS | Status: DC | PRN
  Administered 2017-06-16 – 2017-06-23 (×3): 2 mg via INTRAVENOUS
  Filled 2017-06-15 (×4): qty 1

## 2017-06-15 MED ORDER — MILNACIPRAN HCL 50 MG PO TABS
50.0000 mg | ORAL_TABLET | Freq: Two times a day (BID) | ORAL | Status: DC
  Administered 2017-06-16 – 2017-06-26 (×17): 50 mg via ORAL
  Filled 2017-06-15 (×23): qty 1

## 2017-06-15 MED ORDER — IOPAMIDOL (ISOVUE-300) INJECTION 61%
100.0000 mL | Freq: Once | INTRAVENOUS | Status: AC | PRN
  Administered 2017-06-15: 100 mL via INTRAVENOUS

## 2017-06-15 MED ORDER — GABAPENTIN 400 MG PO CAPS
800.0000 mg | ORAL_CAPSULE | Freq: Three times a day (TID) | ORAL | Status: DC | PRN
  Administered 2017-06-20: 800 mg via ORAL
  Filled 2017-06-15: qty 2

## 2017-06-15 MED ORDER — DICYCLOMINE HCL 10 MG PO CAPS
10.0000 mg | ORAL_CAPSULE | Freq: Every morning | ORAL | Status: DC
  Administered 2017-06-16 – 2017-06-26 (×10): 10 mg via ORAL
  Filled 2017-06-15 (×10): qty 1

## 2017-06-15 MED ORDER — TRAZODONE HCL 100 MG PO TABS
200.0000 mg | ORAL_TABLET | Freq: Every day | ORAL | Status: DC
  Administered 2017-06-15 – 2017-06-25 (×11): 200 mg via ORAL
  Filled 2017-06-15 (×11): qty 2

## 2017-06-15 MED ORDER — ACETAMINOPHEN 325 MG PO TABS
650.0000 mg | ORAL_TABLET | Freq: Four times a day (QID) | ORAL | Status: DC | PRN
  Administered 2017-06-24 – 2017-06-25 (×2): 650 mg via ORAL
  Filled 2017-06-15 (×2): qty 2

## 2017-06-15 MED ORDER — ALBUTEROL SULFATE (2.5 MG/3ML) 0.083% IN NEBU
2.5000 mg | INHALATION_SOLUTION | Freq: Four times a day (QID) | RESPIRATORY_TRACT | Status: DC | PRN
  Administered 2017-06-24: 2.5 mg via RESPIRATORY_TRACT
  Filled 2017-06-15: qty 3

## 2017-06-15 MED ORDER — OXYCODONE HCL 5 MG PO TABS
5.0000 mg | ORAL_TABLET | ORAL | Status: DC | PRN
  Administered 2017-06-17 – 2017-06-25 (×16): 5 mg via ORAL
  Filled 2017-06-15 (×16): qty 1

## 2017-06-15 MED ORDER — MONTELUKAST SODIUM 10 MG PO TABS
10.0000 mg | ORAL_TABLET | Freq: Every day | ORAL | Status: DC
  Administered 2017-06-15 – 2017-06-25 (×11): 10 mg via ORAL
  Filled 2017-06-15 (×11): qty 1

## 2017-06-15 MED ORDER — BACLOFEN 10 MG PO TABS
10.0000 mg | ORAL_TABLET | Freq: Two times a day (BID) | ORAL | Status: DC
  Administered 2017-06-16 – 2017-06-26 (×21): 10 mg via ORAL
  Filled 2017-06-15 (×22): qty 1

## 2017-06-15 MED ORDER — SODIUM CHLORIDE 0.9 % IV SOLN
8.0000 mg/h | INTRAVENOUS | Status: AC
  Administered 2017-06-16 – 2017-06-18 (×6): 8 mg/h via INTRAVENOUS
  Filled 2017-06-15 (×8): qty 80

## 2017-06-15 MED ORDER — ONDANSETRON HCL 4 MG PO TABS
4.0000 mg | ORAL_TABLET | Freq: Four times a day (QID) | ORAL | Status: DC | PRN
  Administered 2017-06-19: 4 mg via ORAL
  Filled 2017-06-15: qty 1

## 2017-06-15 MED ORDER — MORPHINE SULFATE (PF) 2 MG/ML IV SOLN
2.0000 mg | Freq: Once | INTRAVENOUS | Status: AC
  Administered 2017-06-15: 2 mg via INTRAVENOUS
  Filled 2017-06-15: qty 1

## 2017-06-15 MED ORDER — PANTOPRAZOLE SODIUM 40 MG IV SOLR: 40.0000 mg | Freq: Two times a day (BID) | INTRAVENOUS | Status: DC

## 2017-06-15 MED ORDER — ONDANSETRON HCL 4 MG/2ML IJ SOLN
4.0000 mg | Freq: Once | INTRAMUSCULAR | Status: AC
  Administered 2017-06-15: 4 mg via INTRAVENOUS
  Filled 2017-06-15: qty 2

## 2017-06-15 MED ORDER — ACETAMINOPHEN 650 MG RE SUPP: 650.0000 mg | Freq: Four times a day (QID) | RECTAL | Status: DC | PRN

## 2017-06-15 MED ORDER — ONDANSETRON HCL 4 MG/2ML IJ SOLN
4.0000 mg | Freq: Four times a day (QID) | INTRAMUSCULAR | Status: DC | PRN
Start: 2017-06-15 — End: 2017-06-26
  Administered 2017-06-16 – 2017-06-23 (×6): 4 mg via INTRAVENOUS
  Filled 2017-06-15 (×9): qty 2

## 2017-06-15 MED ORDER — INSULIN ASPART 100 UNIT/ML ~~LOC~~ SOLN
0.0000 [IU] | Freq: Four times a day (QID) | SUBCUTANEOUS | Status: DC
  Administered 2017-06-15: 3 [IU] via SUBCUTANEOUS
  Administered 2017-06-16 (×2): 2 [IU] via SUBCUTANEOUS
  Administered 2017-06-16: 7 [IU] via SUBCUTANEOUS
  Administered 2017-06-17: 2 [IU] via SUBCUTANEOUS
  Administered 2017-06-17 (×2): 3 [IU] via SUBCUTANEOUS
  Administered 2017-06-18: 5 [IU] via SUBCUTANEOUS
  Administered 2017-06-18 – 2017-06-19 (×4): 2 [IU] via SUBCUTANEOUS
  Administered 2017-06-19: 3 [IU] via SUBCUTANEOUS
  Administered 2017-06-19 – 2017-06-20 (×2): 2 [IU] via SUBCUTANEOUS
  Filled 2017-06-15 (×15): qty 1

## 2017-06-15 MED ORDER — HYOSCYAMINE SULFATE 0.125 MG PO TABS
0.1250 mg | ORAL_TABLET | ORAL | Status: DC | PRN
  Filled 2017-06-15: qty 1

## 2017-06-15 MED ORDER — PANCRELIPASE (LIP-PROT-AMYL) 12000-38000 UNITS PO CPEP
24000.0000 [IU] | ORAL_CAPSULE | Freq: Every day | ORAL | Status: DC
  Administered 2017-06-16 – 2017-06-25 (×9): 24000 [IU] via ORAL
  Filled 2017-06-15 (×9): qty 2

## 2017-06-15 MED ORDER — FAMOTIDINE 20 MG PO TABS: 20.0000 mg | ORAL_TABLET | Freq: Every day | ORAL | Status: DC

## 2017-06-15 MED ORDER — LORAZEPAM 0.5 MG PO TABS: 0.5000 mg | ORAL_TABLET | Freq: Every evening | ORAL | Status: DC | PRN

## 2017-06-15 NOTE — H&P (Signed)
[facility] [place] NAME: Caroline Hughes    MR#:  [id number]  DATE OF BIRTH:  10-01-1960  DATE OF ADMISSION:  06/15/2017  PRIMARY CARE PHYSICIAN: Sharyne Peach, MD   REQUESTING/REFERRING PHYSICIAN: Clearnce Hasten, MD  CHIEF COMPLAINT:   Chief Complaint  Patient presents with  . Melena    HISTORY OF PRESENT ILLNESS:  Caroline Hughes  is a 57 y.o. female who presents with 5 days of melena and increasing abdominal pain.  Patient has a history of ulcerative colitis status post total colectomy a long time ago.  She has had an ostomy since that time, she states she was in her 96s when she had this procedure performed.  She was subsequently diagnosed with Crohn's disease.  She is of the [place], and does not wish to receive any blood products.  5 days ago she began having dark stools in her ostomy bag.  She began having abdominal pain around the same time.  She also states she had something of a cough just prior to this starting.  Initially she felt may be the abdominal pain is from her coughing.  She has not had any upper respiratory symptoms.  Tonight her hemoglobin is stable, but given her risk factors hospitalist were called for admission and further evaluation.  PAST MEDICAL HISTORY:   Past Medical History:  Diagnosis Date  . Anemia    Iron deficiency IRON INFUSIONS X 5 WEEKS  . Anxiety   . Arthritis   . Asthma   . Bronchitis 2017  . Complication of anesthesia   . Crohn's disease ([place])   . Depression   . Diabetes mellitus without complication ([place])   . Endometriosis   . Fibromyalgia   . Gastric ulcer   . GERD (gastroesophageal reflux disease)   . Herniated disc, cervical   . Hyperlipemia   . Intestinal obstruction ([place])   . Migraine    CHRONIC  . Nausea    CHRONIC  . Neuropathy   . Pain    BACK  . Peristomal hernia   . PONV (postoperative nausea and vomiting)   . Ulcerative colitis ([place])     PAST  SURGICAL HISTORY:   Past Surgical History:  Procedure Laterality Date  . ABDOMINAL HYSTERECTOMY    . CATARACT EXTRACTION W/PHACO Right 11/25/2016   Procedure: CATARACT EXTRACTION PHACO AND INTRAOCULAR LENS PLACEMENT (IOC);  Surgeon: Birder Robson, MD;  Location: [facility] ORS;  Service: Ophthalmology;  Laterality: Right;  [place] 00:40 AP% 17.4 CDE 6.94 Fluid pack lot # [id number]  . CATARACT EXTRACTION W/PHACO Left 12/02/2016   Procedure: CATARACT EXTRACTION PHACO AND INTRAOCULAR LENS PLACEMENT ([place]);  Surgeon: Birder Robson, MD;  Location: [facility] ORS;  Service: Ophthalmology;  Laterality: Left;  [place] 00:34 AP% 14.2 CDE 4.94 Flyuid pack lot 3 [id number]  . CHOLECYSTECTOMY    . COLON RESECTION    . HERNIA REPAIR     MULTIPLE PERISTOMAL  . ILEOSTOMY Bilateral    MOVED X 3  . rectal fistula packing     X 3    SOCIAL HISTORY:   Social History   Tobacco Use  . Smoking status: Former Research scientist (life sciences)  . Smokeless tobacco: Never Used  Substance Use Topics  . Alcohol use: Yes    Alcohol/week: 1.8 oz    Types: 3 Shots of liquor per week    Comment: occassional    FAMILY HISTORY:   Family History  Problem Relation Age of Onset  .  Hypertension Father   . Hyperlipidemia Father   . Heart attack Father     DRUG ALLERGIES:   Allergies  Allergen Reactions  . Naproxen Other (See Comments)    Other Reaction: GI bleed  . Penicillins Swelling, Rash and Other (See Comments)    Reaction: fever Has patient had a PCN reaction causing immediate rash, facial/tongue/throat swelling, SOB or lightheadedness with hypotension: Yes Has patient had a PCN reaction causing severe rash involving mucus membranes or skin necrosis: No Has patient had a PCN reaction that required hospitalization: No Has patient had a PCN reaction occurring within the last 10 years: No If all of the above answers are "NO", then may proceed with Cephalosporin use.   . Gluten Meal     Bloating, gas, pain  . Lactose Intolerance (Gi)      Bloating, gas, pain  . Latex Itching    redness  . Other Diarrhea, Nausea And Vomiting and Other (See Comments)    "DIGESTIVE ISSUES" Nuts  . Shellfish Allergy Other (See Comments)    Reaction: "I get stuffed up, not a true allergy"  . Soy Allergy Other (See Comments)    Reaction: digestive issues.  . Sulfonylureas Other (See Comments)    Reaction: Pt isn't sure what medication this one was associated with or what the reaction was.  . Doxycycline Rash  . Eggs Or Egg-Derived Products Other (See Comments)    Nasal congestion  . Humira [Adalimumab] Rash and Other (See Comments)    Reaction: fever.  . Peanut-Containing Drug Products Rash  . Simvastatin Rash  . Statins Rash  . Sulfa Antibiotics Rash  . Vancomycin Rash and Other (See Comments)    Reaction: fever    MEDICATIONS AT HOME:   Prior to Admission medications   Medication Sig Start Date End Date Taking? Authorizing Provider  albuterol (PROVENTIL HFA;VENTOLIN HFA) 108 (90 BASE) MCG/ACT inhaler Inhale 2 puffs into the lungs every 6 (six) hours as needed for wheezing or shortness of breath.   Yes [provider]  albuterol (PROVENTIL) (2.5 MG/3ML) 0.083% nebulizer solution Inhale 3 mLs into the lungs every 6 (six) hours as needed for shortness of breath. 10/29/16  Yes [provider]  baclofen (LIORESAL) 10 MG tablet Take 10 mg by mouth 2 (two) times daily.  02/13/16  Yes [provider]  Certolizumab Pegol (CIMZIA) 2 X 200 MG KIT Inject 400 mg into the skin every 30 (thirty) days. Once a month on the 13th   Yes [provider]  cyanocobalamin (,VITAMIN B-12,) 1000 MCG/ML injection Inject 1,000 mcg into the muscle every 30 (thirty) days.  09/11/15  Yes [provider]  dicyclomine (BENTYL) 10 MG capsule Take 10 mg by mouth every morning.  01/02/16  Yes [provider]  gabapentin (NEURONTIN) 800 MG tablet Take 800 mg by mouth 3 (three) times daily as needed (pain).    Yes  [provider]  hydrOXYzine (ATARAX/VISTARIL) 25 MG tablet Take 25 mg by mouth every 8 (eight) hours as needed for anxiety.  05/04/12  Yes [provider]  hyoscyamine (LEVSIN, ANASPAZ) 0.125 MG tablet Take 0.125 mg by mouth every 4 (four) hours as needed for bladder spasms or cramping.    Yes [provider]  insulin detemir (LEVEMIR) 100 UNIT/ML injection Inject 45 Units into the skin at bedtime.   Yes [provider]  LORazepam (ATIVAN) 0.5 MG tablet Take 0.5 mg by mouth at bedtime as needed for anxiety or sleep.  Yes [provider]  metaxalone (SKELAXIN) 800 MG tablet Take 800 mg by mouth daily as needed for muscle spasms. 10/23/16  Yes [provider]  Milnacipran (SAVELLA) 50 MG TABS tablet Take 50 mg by mouth 2 (two) times daily.   Yes [provider]  montelukast (SINGULAIR) 10 MG tablet Take 10 mg by mouth at bedtime.  05/04/12  Yes [provider]  ondansetron (ZOFRAN-ODT) 8 MG disintegrating tablet Take 8 mg by mouth 2 (two) times daily as needed for nausea/vomiting. 10/23/16  Yes [provider]  Pancrelipase, Lip-Prot-Amyl, 25000 units CPEP Take 1 capsule by mouth daily.  12/28/14  Yes [provider]  ranitidine (ZANTAC) 150 MG capsule Take 150 mg by mouth daily as needed for heartburn.   Yes [provider]  sucralfate (CARAFATE) 1 GM/10ML suspension Take 1 g by mouth 4 (four) times daily -  with meals and at bedtime.   Yes [provider]  SUMAtriptan (IMITREX) 50 MG tablet Take 50 mg by mouth every 2 (two) hours as needed for migraine. May repeat in 2 hours if headache persists or recurs.   Yes [provider]  tiZANidine (ZANAFLEX) 4 MG tablet Take 4 mg by mouth daily as needed for muscle spasms.   Yes [provider]  traMADol (ULTRAM) 50 MG tablet Take 50 mg by mouth every 6 (six) hours as needed for moderate pain.    Yes [provider]  traZODone  (DESYREL) 100 MG tablet Take 200 mg by mouth at bedtime.    Yes [provider]    REVIEW OF SYSTEMS:  Review of Systems  Constitutional: Negative for chills, fever, malaise/fatigue and weight loss.  HENT: Negative for ear pain, hearing loss and tinnitus.   Eyes: Negative for blurred vision, double vision, pain and redness.  Respiratory: Positive for cough. Negative for hemoptysis and shortness of breath.   Cardiovascular: Negative for chest pain, palpitations, orthopnea and leg swelling.  Gastrointestinal: Positive for abdominal pain and melena. Negative for constipation, diarrhea, nausea and vomiting.  Genitourinary: Negative for dysuria, frequency and hematuria.  Musculoskeletal: Negative for back pain, joint pain and neck pain.  Skin:       No acne, rash, or lesions  Neurological: Negative for dizziness, tremors, focal weakness and weakness.  Endo/Heme/Allergies: Negative for polydipsia. Does not bruise/bleed easily.  Psychiatric/Behavioral: Negative for depression. The patient is not nervous/anxious and does not have insomnia.      VITAL SIGNS:   Vitals:   06/15/17 1634 06/15/17 1645 06/15/17 1700 06/15/17 1730  BP:   (!) 158/80 (!) 146/76  Pulse:   (!) 102 (!) 107  Resp:    18  Temp:  97.8 F (36.6 C)    TempSrc:  Oral    SpO2:   95% 95%  Weight: 65.8 kg (145 lb)     Height: 5' 2" (1.575 m)      Wt Readings from Last 3 Encounters:  06/15/17 65.8 kg (145 lb)  03/17/17 65.3 kg (144 lb)  02/25/17 65.8 kg (145 lb)    PHYSICAL EXAMINATION:  Physical Exam  Vitals reviewed. Constitutional: She is oriented to person, place, and time. She appears well-developed and well-nourished. No distress.  HENT:  Head: Normocephalic and atraumatic.  Mouth/Throat: Oropharynx is clear and moist.  Eyes: Conjunctivae and EOM are normal. Pupils are equal, round, and reactive to light. No scleral icterus.  Neck: Normal range of motion. Neck supple. No JVD present. No thyromegaly  present.  Cardiovascular:  Regular rhythm and intact distal pulses. Exam reveals no gallop and no friction rub.  No murmur heard. Tachycardic  Respiratory: Effort normal and breath sounds normal. No respiratory distress. She has no wheezes. She has no rales.  GI: Soft. Bowel sounds are normal. She exhibits no distension. There is tenderness.  Musculoskeletal: Normal range of motion. She exhibits no edema.  No arthritis, no gout  Lymphadenopathy:    She has no cervical adenopathy.  Neurological: She is alert and oriented to person, place, and time. No cranial nerve deficit.  No dysarthria, no aphasia  Skin: Skin is warm and dry. No rash noted. No erythema.  Psychiatric: She has a normal mood and affect. Her behavior is normal. Judgment and thought content normal.    LABORATORY PANEL:   CBC Recent Labs  Lab 06/15/17 1746  WBC 13.5*  HGB 12.4  HCT 37.1  PLT 355   ------------------------------------------------------------------------------------------------------------------  Chemistries  Recent Labs  Lab 06/15/17 1746  NA 133*  K 4.6  CL 99*  CO2 22  GLUCOSE 445*  BUN 27*  CREATININE 0.92  CALCIUM 8.6*  AST 32  ALT 16  ALKPHOS 85  BILITOT 0.2*   ------------------------------------------------------------------------------------------------------------------  Cardiac Enzymes No results for input(s): TROPONINI in the last 168 hours. ------------------------------------------------------------------------------------------------------------------  RADIOLOGY:  Ct Abdomen Pelvis W Contrast  Result Date: 06/15/2017 CLINICAL DATA:  Acute generalized abdomen pain EXAM: CT ABDOMEN AND PELVIS WITH CONTRAST TECHNIQUE: Multidetector CT imaging of the abdomen and pelvis was performed using the standard protocol following bolus administration of intravenous contrast. CONTRAST:  147m ISOVUE-300 IOPAMIDOL (ISOVUE-300) INJECTION 61% COMPARISON:  November 18, 2014 FINDINGS: Lower  chest: No acute abnormality. Hepatobiliary: There is diffuse low density of the liver. No focal liver lesion is identified. The patient status post prior cholecystectomy. Post surgical intra and extrahepatic biliary ductal dilatation is identified. The common bowel duct measures 1.3 cm. Pancreas: Unremarkable. No pancreatic ductal dilatation or surrounding inflammatory changes. Spleen: Normal in size without focal abnormality. Adrenals/Urinary Tract: The adrenal glands are normal. There is a small cyst in lower pole left kidney. Bilateral parapelvic renal cysts are identified. There is no hydronephrosis bilaterally. The bladder is normal. Stomach/Bowel: There is a periosteal mole hernia containing several on obstructed small bowel loops adjacent to the ileostomy unchanged compared to prior exam. There is no evidence of small bowel obstruction. Prior surgical changes of the bowel are stable. Vascular/Lymphatic: No significant vascular findings are present. No enlarged abdominal or pelvic lymph nodes. Reproductive: Status post hysterectomy. No adnexal masses. Other: None. Musculoskeletal: No acute or significant osseous findings. IMPRESSION: Right lower quadrant ileostomy with adjacent herniated nonobstructive bowel loop unchanged compared to prior exam. There is no evidence of bowel obstruction. No acute abnormality is identified in the abdomen and pelvis. Electronically Signed   By: WAbelardo DieselM.D.   On: 06/15/2017 19:04    EKG:   Orders placed or performed in visit on 12/16/14  . EKG 12-Lead  . EKG 12-Lead  . EKG 12-Lead    IMPRESSION AND PLAN:  Principal Problem:   Melena -unclear etiology, though the patient does have a history of Crohn's disease.  She follows [place] gastroenterology from Dr. [place].  Hemoglobin is stable at this time.  We will admit her with gentle IV fluids tonight, PPI drip, and a GI consult Active Problems:   Crohn disease ([place] -continue home meds, GI consult as  above   Controlled type 2 diabetes mellitus with diabetic nephropathy, with long-term current use  of insulin ([place]) -hold any scheduled insulin as the patient is n.p.o., sliding scale insulin with corresponding glucose checks   Anxiety -home dose anxiolytics   GERD (gastroesophageal reflux disease) -PPI as above and home dose H2 blocker  All the records are reviewed and case discussed with ED provider. Management plans discussed with the patient and/or family.  DVT PROPHYLAXIS: Mechanical only  GI PROPHYLAXIS: PPI, H2 blocker  ADMISSION STATUS: Inpatient  CODE STATUS: Full Code Status History    This patient does not have a recorded code status. Please follow your organizational policy for patients in this situation.    Advance Directive Documentation     Most Recent Value  Type of Advance Directive  Healthcare Power of Attorney  Pre-existing out of facility DNR order (yellow form or pink MOST form)  No data  "MOST" Form in Place?  No data      TOTAL TIME TAKING CARE OF THIS PATIENT: 45 minutes.   Jannifer Franklin, Royalty Domagala Albers 06/15/2017, 8:45 PM  [employer]  [phone]  CC: Primary care physician; Sharyne Peach, MD  Note:  This document was prepared using Dragon voice recognition software and may include unintentional dictation errors.\

## 2017-06-15 NOTE — ED Triage Notes (Signed)
Pt arrives via [facility] from home with c/o dark, black, foul-smelling stool in ileostomy x5 days. 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 Pt c/o RUQ and LUQ pain with weakness and dizziness. Pt will not accept a blood transfusion d/t religious beliefs.

## 2017-06-15 NOTE — ED Provider Notes (Signed)
[facility] Emergency Department Provider Note  ____________________________________________   First MD Initiated Contact with Patient 06/15/17 1626     (approximate)  I have reviewed the triage vital signs and the nursing notes.   HISTORY  Chief Complaint Melena   HPI Caroline Hughes is a 57 y.o. female with a history of anemia, Crohn's disease and ulcerative colitis status post bowel resection with colostomy for over 30 years who is presenting to the emergency department today with black stools from her ostomy as well as left sided abdominal pain.  She has not reporting any nausea or vomiting.  Says that the symptoms have been ongoing for 5 days.  Says the pain is an 8 out of 10, cramping and constant with radiation through to the back.  Says that she is seen at the [facility] clinic gastroenterology office.  Does not take any blood thinners.  Was told to come to the hospital today by her gastroneurologist after she called the office.  Past Medical History:  Diagnosis Date  . Anemia    Iron deficiency IRON INFUSIONS X 5 WEEKS  . Anxiety   . Arthritis   . Asthma   . Bronchitis 2017  . Complication of anesthesia   . Crohn's disease ([place])   . Depression   . Diabetes mellitus without complication ([place])   . Endometriosis   . Fibromyalgia   . Gastric ulcer   . GERD (gastroesophageal reflux disease)   . Herniated disc, cervical   . Hyperlipemia   . Intestinal obstruction ([place])   . Migraine    CHRONIC  . Nausea    CHRONIC  . Neuropathy   . Pain    BACK  . Peristomal hernia   . PONV (postoperative nausea and vomiting)   . Ulcerative colitis [facility])     Patient Active Problem List   Diagnosis Date Noted  . Fibromyalgia 02/18/2017  . DDD (degenerative disc disease), cervical 02/18/2017  . Spondylosis of cervical region without myelopathy or radiculopathy 02/18/2017  . Chronic pain syndrome 02/18/2017  . Cervical myofascial pain syndrome 01/21/2017   . Controlled type 2 diabetes mellitus with diabetic nephropathy, with long-term current use of insulin ([place]) 07/09/2016  . Iron deficiency anemia 02/18/2016  . Anxiety 09/24/2012    Past Surgical History:  Procedure Laterality Date  . ABDOMINAL HYSTERECTOMY    . CATARACT EXTRACTION W/PHACO Right 11/25/2016   Procedure: CATARACT EXTRACTION PHACO AND INTRAOCULAR LENS PLACEMENT (IOC);  Surgeon: Birder Robson, MD;  Location: [facility] ORS;  Service: Ophthalmology;  Laterality: Right;  [place] 00:40 AP% 17.4 CDE 6.94 Fluid pack lot # [id number]  . CATARACT EXTRACTION W/PHACO Left 12/02/2016   Procedure: CATARACT EXTRACTION PHACO AND INTRAOCULAR LENS PLACEMENT ([place]);  Surgeon: Birder Robson, MD;  Location: [facility] ORS;  Service: Ophthalmology;  Laterality: Left;  [place] 00:34 AP% 14.2 CDE 4.94 Flyuid pack lot 3 [id number]  . CHOLECYSTECTOMY    . COLON RESECTION    . HERNIA REPAIR     MULTIPLE PERISTOMAL  . ILEOSTOMY Bilateral    MOVED X 3  . rectal fistula packing     X 3    Prior to Admission medications   Medication Sig Start Date End Date Taking? Authorizing Provider  albuterol (PROVENTIL HFA;VENTOLIN HFA) 108 (90 BASE) MCG/ACT inhaler Inhale 2 puffs into the lungs every 6 (six) hours as needed for wheezing or shortness of breath.   Yes [provider]  albuterol (PROVENTIL) (2.5 MG/3ML) 0.083% nebulizer solution Inhale 3 mLs  into the lungs every 6 (six) hours as needed for shortness of breath. 10/29/16  Yes [provider]  baclofen (LIORESAL) 10 MG tablet Take 10 mg by mouth 2 (two) times daily.  02/13/16  Yes [provider]  Certolizumab Pegol (CIMZIA) 2 X 200 MG KIT Inject 400 mg into the skin every 30 (thirty) days. Once a month on the 13th   Yes [provider]  cyanocobalamin (,VITAMIN B-12,) 1000 MCG/ML injection Inject 1,000 mcg into the muscle every 30 (thirty) days.  09/11/15  Yes [provider]  dicyclomine (BENTYL) 10 MG capsule Take 10 mg  by mouth every morning.  01/02/16  Yes [provider]  gabapentin (NEURONTIN) 800 MG tablet Take 800 mg by mouth 3 (three) times daily as needed (pain).    Yes [provider]  hydrOXYzine (ATARAX/VISTARIL) 25 MG tablet Take 25 mg by mouth every 8 (eight) hours as needed for anxiety.  05/04/12  Yes [provider]  hyoscyamine (LEVSIN, ANASPAZ) 0.125 MG tablet Take 0.125 mg by mouth every 4 (four) hours as needed for bladder spasms or cramping.    Yes [provider]  insulin detemir (LEVEMIR) 100 UNIT/ML injection Inject 45 Units into the skin at bedtime.   Yes [provider]  LORazepam (ATIVAN) 0.5 MG tablet Take 0.5 mg by mouth at bedtime as needed for anxiety or sleep.    Yes [provider]  metaxalone (SKELAXIN) 800 MG tablet Take 800 mg by mouth daily as needed for muscle spasms. 10/23/16  Yes [provider]  Milnacipran (SAVELLA) 50 MG TABS tablet Take 50 mg by mouth 2 (two) times daily.   Yes [provider]  montelukast (SINGULAIR) 10 MG tablet Take 10 mg by mouth at bedtime.  05/04/12  Yes [provider]  ondansetron (ZOFRAN-ODT) 8 MG disintegrating tablet Take 8 mg by mouth 2 (two) times daily as needed for nausea/vomiting. 10/23/16  Yes [provider]  Pancrelipase, Lip-Prot-Amyl, 25000 units CPEP Take 1 capsule by mouth daily.  12/28/14  Yes [provider]  ranitidine (ZANTAC) 150 MG capsule Take 150 mg by mouth daily as needed for heartburn.   Yes [provider]  sucralfate (CARAFATE) 1 GM/10ML suspension Take 1 g by mouth 4 (four) times daily -  with meals and at bedtime.   Yes [provider]  SUMAtriptan (IMITREX) 50 MG tablet Take 50 mg by mouth every 2 (two) hours as needed for migraine. May repeat in 2 hours if headache persists or recurs.   Yes [provider]  tiZANidine (ZANAFLEX) 4 MG tablet Take 4 mg by mouth daily as needed for muscle spasms.   Yes  [provider]  traMADol (ULTRAM) 50 MG tablet Take 50 mg by mouth every 6 (six) hours as needed for moderate pain.    Yes [provider]  traZODone (DESYREL) 100 MG tablet Take 200 mg by mouth at bedtime.    Yes [provider]    Allergies Naproxen; Penicillins; Gluten meal; Lactose intolerance (gi); Latex; Other; Shellfish allergy; Soy allergy; Sulfonylureas; Doxycycline; Eggs or egg-derived products; Humira [adalimumab]; Peanut-containing drug products; Simvastatin; Statins; Sulfa antibiotics; and Vancomycin  No family history on file.  Social History Social History   Tobacco Use  . Smoking status: Former Research scientist (life sciences)  . Smokeless tobacco: Never Used  Substance Use Topics  . Alcohol use: Yes    Alcohol/week: 1.8 oz    Types: 3 Shots of liquor per week  Comment: occassional  . Drug use: No    Review of Systems  Constitutional: No fever/chills Eyes: No visual changes. ENT: No sore throat. Cardiovascular: Denies chest pain. Respiratory: Denies shortness of breath. Gastrointestinal: No nausea, no vomiting. No constipation. Genitourinary: Negative for dysuria. Musculoskeletal: Negative for back pain. Skin: Negative for rash. Neurological: Negative for headaches, focal weakness or numbness.   ____________________________________________   PHYSICAL EXAM:  VITAL SIGNS: ED Triage Vitals  Enc Vitals Group     BP      Pulse      Resp      Temp      Temp src      SpO2      Weight      Height      Head Circumference      Peak Flow      Pain Score      Pain Loc      Pain Edu?      Excl. in [place]?     Constitutional: Alert and oriented. Well appearing and in no acute distress. Eyes: Conjunctivae are normal.  Head: Atraumatic. Nose: No congestion/rhinnorhea. Mouth/Throat: Mucous membranes are moist.  Neck: No stridor.   Cardiovascular: Normal rate, regular rhythm. Grossly normal heart sounds.  Respiratory: Normal respiratory effort.  No  retractions. Lungs CTAB. Gastrointestinal: Soft with mild to moderate left-sided abdominal tenderness to palpation without rebound or guarding.  No masses palpated. No distention.  Also with mild right lower quadrant tenderness just lateral to her ostomy site where she says that she has hernia associated with her ostomy.  I do not palpate any large hernia defect or bulge.  Black stool visualized from the ostomy which is strongly heme positive.  Stool is gelatinous. Musculoskeletal: No lower extremity tenderness nor edema.  No joint effusions. Neurologic:  Normal speech and language. No gross focal neurologic deficits are appreciated. Skin:  Skin is warm, dry and intact. No rash noted. Psychiatric: Mood and affect are normal. Speech and behavior are normal.  ____________________________________________   LABS (all labs ordered are listed, but only abnormal results are displayed)  Labs Reviewed  CBC WITH DIFFERENTIAL/PLATELET - Abnormal; Notable for the following components:      Result Value   WBC 13.5 (*)    Neutro Abs 12.1 (*)    Lymphs Abs 0.8 (*)    All other components within normal limits  COMPREHENSIVE METABOLIC PANEL - Abnormal; Notable for the following components:   Sodium 133 (*)    Chloride 99 (*)    Glucose, Bld 445 (*)    BUN 27 (*)    Calcium 8.6 (*)    Total Bilirubin 0.2 (*)    All other components within normal limits  URINALYSIS, COMPLETE (UACMP) WITH MICROSCOPIC - Abnormal; Notable for the following components:   Color, Urine COLORLESS (*)    APPearance CLEAR (*)    Glucose, UA >=500 (*)    Ketones, ur 5 (*)    All other components within normal limits  PROTIME-INR - Abnormal; Notable for the following components:   Prothrombin Time 11.1 (*)    All other components within normal limits  LIPASE, BLOOD  APTT   ____________________________________________  EKG   ____________________________________________  RADIOLOGY  CT scan with visualization of the  ileostomy as well as hernia but without obstruction or pathology ____________________________________________   PROCEDURES  Procedure(s) performed:   Procedures  Critical Care performed:   ____________________________________________   INITIAL IMPRESSION / ASSESSMENT AND PLAN / ED COURSE  Pertinent labs & imaging results that were available during my care of the patient were reviewed by me and considered in my medical decision making (see chart for details).  Differential diagnosis includes, but is not limited to, ovarian cyst, ovarian torsion, acute appendicitis, diverticulitis, urinary tract infection/pyelonephritis, endometriosis, bowel obstruction, colitis, renal colic, gastroenteritis, hernia, fibroids, endometriosis, pregnancy related pain including ectopic pregnancy, etc. As part of my medical decision making, I reviewed the following data within the electronic MEDICAL RECORD NUMBER Notes from prior outpatient office visits.  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  Clinical Course as of Jun 16 1927  Mon Jun 15, 2017  1658 I discussed the case with the gastroneurologist, Dr. Vicente Males, who says that if the bleed appears to be brisk then we may perform a tagged scan.  Otherwise, the other option would be to scope the patient, likely tomorrow.  We discussed the patient's religious beliefs and not accepting blood transfusions.  [DS]    Clinical Course User Index [DS] Orbie Pyo, MD   ----------------------------------------- 7:29 PM on 06/15/2017 -----------------------------------------  Patient without any distress at this time.  Resting without any objective, outward signs of pain.  I discussed the findings of the CT scan as well as lab work with the patient as well as friends were at the bedside.  The patient is aware of the need for admission to the hospital and further workup.  The patient has hardly any blood in the bag at  this time.  She had a reassuring hemoglobin which was normal at 12.4.  I do not believe that a tagged scan would have utility at this time as the bleeding is not brisk and I do not believe that it would show any active bleeding.  Patient will be admitted for further observation.  Signed out to Dr. Anselm Jungling.  Patient is understanding of the plan willing to comply.  ____________________________________________   FINAL CLINICAL IMPRESSION(S) / ED DIAGNOSES  Acute GI bleeding.    NEW MEDICATIONS STARTED DURING THIS VISIT:  New Prescriptions   No medications on file     Note:  This document was prepared using Dragon voice recognition software and may include unintentional dictation errors.     Orbie Pyo, MD 06/15/17 [phone]

## 2017-06-15 NOTE — ED Notes (Addendum)
Pt refused a type and screen draw. D/t religious beliefs, she refuses to accept blood products and states "it would be pointless" to draw the type and screen. MD aware; order d/cd.

## 2017-06-16 DIAGNOSIS — K922 Gastrointestinal hemorrhage, unspecified: Secondary | ICD-10-CM

## 2017-06-16 LAB — BASIC METABOLIC PANEL
ANION GAP: 7 (ref 5–15)
BUN: 15 mg/dL (ref 6–20)
CHLORIDE: 106 mmol/L (ref 101–111)
CO2: 26 mmol/L (ref 22–32)
Calcium: 8.3 mg/dL — ABNORMAL LOW (ref 8.9–10.3)
Creatinine, Ser: 0.62 mg/dL (ref 0.44–1.00)
GFR calc Af Amer: >60 mL/min (ref >60)
GFR calc non Af Amer: >60 mL/min (ref >60)
GLUCOSE: 134 mg/dL — AB (ref 65–99)
POTASSIUM: 3.7 mmol/L (ref 3.5–5.1)
Sodium: 139 mmol/L (ref 135–145)

## 2017-06-16 LAB — CBC
HEMATOCRIT: 34.1 % — AB (ref 35.0–47.0)
HEMOGLOBIN: 11.7 g/dL — AB (ref 12.0–16.0)
MCH: 33 pg (ref 26.0–34.0)
MCHC: 34.3 g/dL (ref 32.0–36.0)
MCV: 96 fL (ref 80.0–100.0)
Platelets: 301 10*3/uL (ref 150–440)
RBC: 3.55 MIL/uL — ABNORMAL LOW (ref 3.80–5.20)
RDW: 12.7 % (ref 11.5–14.5)
WBC: 8.9 10*3/uL (ref 3.6–11.0)

## 2017-06-16 LAB — GLUCOSE, CAPILLARY
Glucose-Capillary: 156 mg/dL — ABNORMAL HIGH (ref 65–99)
Glucose-Capillary: 169 mg/dL — ABNORMAL HIGH (ref 65–99)
Glucose-Capillary: 244 mg/dL — ABNORMAL HIGH (ref 65–99)
Glucose-Capillary: 319 mg/dL — ABNORMAL HIGH (ref 65–99)

## 2017-06-16 MED ORDER — SODIUM CHLORIDE 0.9 % IV SOLN
INTRAVENOUS | Status: DC
  Administered 2017-06-16 – 2017-06-19 (×6): via INTRAVENOUS

## 2017-06-16 MED ORDER — PEG 3350-KCL-NA BICARB-NACL 420 G PO SOLR
2000.0000 mL | Freq: Once | ORAL | Status: AC
  Administered 2017-06-16: 2000 mL via ORAL
  Filled 2017-06-16: qty 4000

## 2017-06-16 MED ORDER — ADULT MULTIVITAMIN W/MINERALS CH
1.0000 | ORAL_TABLET | Freq: Every day | ORAL | Status: DC
  Administered 2017-06-16 – 2017-06-25 (×9): 1 via ORAL
  Filled 2017-06-16 (×8): qty 1

## 2017-06-16 MED ORDER — BOOST / RESOURCE BREEZE PO LIQD CUSTOM
1.0000 | Freq: Three times a day (TID) | ORAL | Status: DC
  Administered 2017-06-16 – 2017-06-17 (×2): 1 via ORAL
  Administered 2017-06-20: 240 mL via ORAL
  Administered 2017-06-21: 1 via ORAL

## 2017-06-16 NOTE — Progress Notes (Signed)
Per Dr. Tressia Miners renew fluid order of normal saline 43m/hr.

## 2017-06-16 NOTE — Progress Notes (Signed)
[facility] [place] NAME: Caroline Hughes    MR#:  [id number]  DATE OF BIRTH:  [birth year]  SUBJECTIVE:  CHIEF COMPLAINT:   Chief Complaint  Patient presents with  . Melena   -Patient with Crohn's and ulcerative colitis status post a total colectomy and has an ileostomy presents to hospital secondary to abdominal pain and three-day history of melena  REVIEW OF SYSTEMS:  Review of Systems  Constitutional: Negative for chills, fever and malaise/fatigue.  HENT: Negative for congestion, ear discharge, hearing loss and nosebleeds.   Eyes: Negative for blurred vision, double vision and photophobia.  Respiratory: Negative for cough, shortness of breath and wheezing.   Cardiovascular: Negative for chest pain, palpitations and leg swelling.  Gastrointestinal: Positive for abdominal pain, diarrhea and melena. Negative for constipation, nausea and vomiting.  Genitourinary: Negative for dysuria.  Musculoskeletal: Negative for myalgias.  Neurological: Negative for dizziness, speech change, focal weakness, seizures and headaches.  Psychiatric/Behavioral: Negative for depression.    DRUG ALLERGIES:   Allergies  Allergen Reactions  . Naproxen Other (See Comments)    Other Reaction: GI bleed  . Penicillins Swelling, Rash and Other (See Comments)    Reaction: fever Has patient had a PCN reaction causing immediate rash, facial/tongue/throat swelling, SOB or lightheadedness with hypotension: Yes Has patient had a PCN reaction causing severe rash involving mucus membranes or skin necrosis: No Has patient had a PCN reaction that required hospitalization: No Has patient had a PCN reaction occurring within the last 10 years: No If all of the above answers are "NO", then may proceed with Cephalosporin use.   . Gluten Meal     Bloating, gas, pain  . Lactose Intolerance (Gi)     Bloating, gas, pain  . Latex Itching    redness  . Other Diarrhea,  Nausea And Vomiting and Other (See Comments)    "DIGESTIVE ISSUES" Nuts  . Shellfish Allergy Other (See Comments)    Reaction: "I get stuffed up, not a true allergy"  . Soy Allergy Other (See Comments)    Reaction: digestive issues.  . Sulfonylureas Other (See Comments)    Reaction: Pt isn't sure what medication this one was associated with or what the reaction was.  . Doxycycline Rash  . Eggs Or Egg-Derived Products Other (See Comments)    Nasal congestion  . Humira [Adalimumab] Rash and Other (See Comments)    Reaction: fever.  . Peanut-Containing Drug Products Rash  . Simvastatin Rash  . Statins Rash  . Sulfa Antibiotics Rash  . Vancomycin Rash and Other (See Comments)    Reaction: fever    VITALS:  Blood pressure 130/68, pulse 97, temperature 97.8 F (36.6 C), temperature source Oral, resp. rate 18, height 5' 2"  (1.575 m), weight 66.3 kg (146 lb 3.2 oz), SpO2 99 %.  PHYSICAL EXAMINATION:  Physical Exam  GENERAL:  57 y.o.-year-old patient lying in the bed with no acute distress.  EYES: Pupils equal, round, reactive to light and accommodation. No scleral icterus. Extraocular muscles intact.  HEENT: Head atraumatic, normocephalic. Oropharynx and nasopharynx clear.  NECK:  Supple, no jugular venous distention. No thyroid enlargement, no tenderness.  LUNGS: Normal breath sounds bilaterally, no wheezing, rales,rhonchi or crepitation. No use of accessory muscles of respiration.  CARDIOVASCULAR: S1, S2 normal. No murmurs, rubs, or gallops.  ABDOMEN: Soft, but tender in left upper quadrant and around the umbilicus, nondistended. Ileostomy bag in place with dark stool Bowel sounds present. No organomegaly  or mass.  EXTREMITIES: No pedal edema, cyanosis, or clubbing.  NEUROLOGIC: Cranial nerves II through XII are intact. Muscle strength 5/5 in all extremities. Sensation intact. Gait not checked.  PSYCHIATRIC: The patient is alert and oriented x 3.  SKIN: No obvious rash, lesion, or  ulcer.    LABORATORY PANEL:   CBC Recent Labs  Lab 06/16/17 0353  WBC 8.9  HGB 11.7*  HCT 34.1*  PLT 301   ------------------------------------------------------------------------------------------------------------------  Chemistries  Recent Labs  Lab 06/15/17 1746 06/16/17 0353  NA 133* 139  K 4.6 3.7  CL 99* 106  CO2 22 26  GLUCOSE 445* 134*  BUN 27* 15  CREATININE 0.92 0.62  CALCIUM 8.6* 8.3*  AST 32  --   ALT 16  --   ALKPHOS 85  --   BILITOT 0.2*  --    ------------------------------------------------------------------------------------------------------------------  Cardiac Enzymes No results for input(s): TROPONINI in the last 168 hours. ------------------------------------------------------------------------------------------------------------------  RADIOLOGY:  Ct Abdomen Pelvis W Contrast  Result Date: 06/15/2017 CLINICAL DATA:  Acute generalized abdomen pain EXAM: CT ABDOMEN AND PELVIS WITH CONTRAST TECHNIQUE: Multidetector CT imaging of the abdomen and pelvis was performed using the standard protocol following bolus administration of intravenous contrast. CONTRAST:  117m ISOVUE-300 IOPAMIDOL (ISOVUE-300) INJECTION 61% COMPARISON:  November 18, 2014 FINDINGS: Lower chest: No acute abnormality. Hepatobiliary: There is diffuse low density of the liver. No focal liver lesion is identified. The patient status post prior cholecystectomy. Post surgical intra and extrahepatic biliary ductal dilatation is identified. The common bowel duct measures 1.3 cm. Pancreas: Unremarkable. No pancreatic ductal dilatation or surrounding inflammatory changes. Spleen: Normal in size without focal abnormality. Adrenals/Urinary Tract: The adrenal glands are normal. There is a small cyst in lower pole left kidney. Bilateral parapelvic renal cysts are identified. There is no hydronephrosis bilaterally. The bladder is normal. Stomach/Bowel: There is a periosteal mole hernia containing  several on obstructed small bowel loops adjacent to the ileostomy unchanged compared to prior exam. There is no evidence of small bowel obstruction. Prior surgical changes of the bowel are stable. Vascular/Lymphatic: No significant vascular findings are present. No enlarged abdominal or pelvic lymph nodes. Reproductive: Status post hysterectomy. No adnexal masses. Other: None. Musculoskeletal: No acute or significant osseous findings. IMPRESSION: Right lower quadrant ileostomy with adjacent herniated nonobstructive bowel loop unchanged compared to prior exam. There is no evidence of bowel obstruction. No acute abnormality is identified in the abdomen and pelvis. Electronically Signed   By: WAbelardo DieselM.D.   On: 06/15/2017 19:04    EKG:   Orders placed or performed in visit on 12/16/14  . EKG 12-Lead  . EKG 12-Lead  . EKG 12-Lead    ASSESSMENT AND PLAN:   57year old female with past medical history significant for ulcerative colitis status post total colectomy, ileostomy, Crohn's disease, arthritis, diabetes, GERD presents to hospital secondary to abdominal pain and melena  1. Melena-could be from her Crohn's disease. Also had a history of gastric ulcers. -GI consult. Continue nothing by mouth. Might need an EGD. -On IV Protonix and fluids for now. -Hemoglobin is stable. -CT of the abdomen on admission showing nonobstructive bowel loop pattern. No other acute abnormality noted.  2. Crohn's disease-recently finished a prednisone taper. -On Bentyl, Creon, Levsin and pain medications.  3. Diabetes mellitus-sliding scale insulin while nothing by mouth. Takes Levemir 45 units at bedtime. Sugars however have been within normal limits for now.  4. Chronic abdominal pain-continue pain medications  5. DVT prophylaxis-Ted's and SCDs  All the records are reviewed and case discussed with Care Management/Social Workerr. Management plans discussed with the patient, family and they are in  agreement.  CODE STATUS: Full code  TOTAL TIME TAKING CARE OF THIS PATIENT: 38 minutes.   POSSIBLE D/C IN 1-2 DAYS, DEPENDING ON CLINICAL CONDITION.   Gladstone Lighter M.D on 06/16/2017 at 12:14 PM  Between 7am to 6pm - Pager - [phone]  After 6pm go to [email or web address] - password [email or web address] [facility]  Office  [phone]  CC: Primary care physician; Sharyne Peach, MD

## 2017-06-16 NOTE — Progress Notes (Addendum)
Initial Nutrition Assessment  DOCUMENTATION CODES:   Not applicable  INTERVENTION:   Boost Breeze po TID, each supplement provides 250 kcal and 9 grams of protein  MVI daily   NUTRITION DIAGNOSIS:   Inadequate oral intake related to acute illness as evidenced by per patient/family report.  GOAL:   Patient will meet greater than or equal to 90% of their needs  MONITOR:   PO intake, Diet advancement, Supplement acceptance, Labs, I & O's, Weight trends  REASON FOR ASSESSMENT:   Malnutrition Screening Tool    ASSESSMENT:   57 y.o. female with a history of DM, anemia, Crohn's disease and ulcerative colitis status post bowel resection with colostomy for over 30 years who is presenting to the emergency department with black stools from her ostomy as well as left sided abdominal pain.   Met with pt in room today. 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 Pt reports that she has been having flares 5-6 times a year. Pt reports good appetite and oral intake at baseline. Pt avoids many foods in fear of developing diarrhea and now reports that she has allergies to these foods. Pt does not describe a true allergy but reports more of an intolerance describing side effects such as "bloating, diarrhea, and abdominal pain". Foods that pt avoids are eggs, garlic, gluten, soy, nuts and seeds, lactose, iceberg lettuce, fruits and vegetables that are high fiber, artificial sweetners, sorbitol, and shellfish. Once pt advanced to a clear liquid diet, pt will only be able to have popcicles, jello, and juice as the broths contain gluten and soy. Pt will not be able to have anything on a full liquid diet r/t her lactose intolerance. Once pt advanced to a soft diet, there is a note in [facility] that pt has multiple allergies. We do not have a true gluten free menu here but spoke to pt, pt feels like she is able to order foods from the menu that she is able  to tolerate. Per chart, pt is weight stable. Per MD, patient to have EGD and ileoscopy tomorrow.   Medications reviewed and include: insulin, creon, protonix  Labs reviewed:  Nutrition-Focused physical exam completed. Findings are no fat depletion, no muscle depletion, and no edema.   Diet Order:  Diet NPO time specified Except for: Sips with Meds  EDUCATION NEEDS:   Education needs have been addressed  Skin: Reviewed RN Assessment  Last BM:  2/26- ileostomy   Height:   Ht Readings from Last 1 Encounters:  06/15/17 _0  (1.575 m)    Weight:   Wt Readings from Last 1 Encounters:  06/15/17 146 lb 3.2 oz (66.3 kg)    Ideal Body Weight:  50 kg  BMI:  Body mass index is 26.74 kg/m.  Estimated Nutritional Needs:   Kcal:  1400-1600kcal/day   Protein:  66-79g/day   Fluid:  >1.6L/day   Koleen Distance MS, RD, LDN Pager #[phone] After Hours Pager: [phone]

## 2017-06-16 NOTE — Progress Notes (Signed)
Verified with Dr. Vicente Males patient to have EGD and ileoscopy tomorrow. Pt to start Prep at 1700. Okay per MD for pt to have clear liquids and NPO after midnight.

## 2017-06-16 NOTE — Consult Note (Signed)
[name] , MD [address], [place], [place], [place], [address] [address], [place], [place], [place], [address] Phone: [phone]  Fax: [phone]  Consultation  Referring Provider: Dr Tressia Miners Primary Care Physician:  Sharyne Peach, MD Primary Gastroenterologist:  Dr. Tiffany Kocher         Reason for Consultation:     Melena   Date of Admission:  06/15/2017 Date of Consultation:  06/16/2017         HPI:   Caroline Hughes is a 57 y.o. female was sent to the ER by Dr Dillard [place] office for melena. Last seen at their office on 05/04/17 . 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 03/11/10: Capsule endoscopy reportedly showed gastroduodenal and distal small bowel erosions. On cimizia then changed to Humira and at some point back to Cimzia.   Non bleeding ulcers seen on EGD in 2016 , normal ileoscopy back in 2015 . She was admited as she was having dark stools in the ostomy bag, she is a Arts development officer witness. Hb stable on admission and no acute findings on CT abdomen.   Has noticed black stools last 4 days, no use of nsaids, no hematemesis.     Past Medical History:  Diagnosis Date  . Anemia    Iron deficiency IRON INFUSIONS X 5 WEEKS  . Anxiety   . Arthritis   . Asthma   . Bronchitis 2017  . Complication of anesthesia   . Crohn's disease ([place])   . Depression   . Diabetes mellitus without complication ([place])   . Endometriosis   . Fibromyalgia   . Gastric ulcer   . GERD (gastroesophageal reflux disease)   . Herniated disc, cervical   . Hyperlipemia   . Intestinal obstruction ([place])   . Migraine    CHRONIC  . Nausea    CHRONIC  . Neuropathy   . Pain    BACK  . Peristomal hernia   . PONV (postoperative nausea and vomiting)   . Ulcerative colitis [facility])     Past Surgical History:  Procedure Laterality Date  . ABDOMINAL HYSTERECTOMY    . CATARACT EXTRACTION W/PHACO Right 11/25/2016   Procedure: CATARACT EXTRACTION PHACO AND INTRAOCULAR LENS PLACEMENT (IOC);  Surgeon: Birder Robson, MD;  Location: [facility] ORS;  Service: Ophthalmology;  Laterality: Right;  [place] 00:40 AP% 17.4 CDE 6.94 Fluid pack lot # [id number]  . CATARACT EXTRACTION W/PHACO Left 12/02/2016   Procedure: CATARACT EXTRACTION PHACO AND INTRAOCULAR LENS PLACEMENT ([place]);  Surgeon: Birder Robson, MD;  Location: [facility] ORS;  Service: Ophthalmology;  Laterality: Left;  [place] 00:34 AP% 14.2 CDE 4.94 Flyuid pack lot 3 [id number]  . CHOLECYSTECTOMY    . COLON RESECTION    . HERNIA REPAIR     MULTIPLE PERISTOMAL  . ILEOSTOMY Bilateral    MOVED X 3  . rectal fistula packing     X 3    Prior to Admission medications   Medication Sig Start Date End Date Taking? Authorizing Provider  albuterol (PROVENTIL HFA;VENTOLIN HFA) 108 (90 BASE) MCG/ACT inhaler Inhale 2 puffs into the lungs every 6 (six) hours as needed for wheezing or shortness of breath.   Yes [provider]  albuterol (PROVENTIL) (2.5 MG/3ML) 0.083% nebulizer solution Inhale 3 mLs into the lungs every 6 (six) hours as needed for shortness of breath. 10/29/16  Yes [provider]  baclofen (LIORESAL) 10 MG tablet Take 10 mg by mouth 2 (two) times  daily.  02/13/16  Yes [provider]  Certolizumab Pegol (CIMZIA) 2 X 200 MG KIT Inject 400 mg into the skin every 30 (thirty) days. Once a month on the 13th   Yes [provider]  cyanocobalamin (,VITAMIN B-12,) 1000 MCG/ML injection Inject 1,000 mcg into the muscle every 30 (thirty) days.  09/11/15  Yes [provider]  dicyclomine (BENTYL) 10 MG capsule Take 10 mg by mouth every morning.  01/02/16  Yes [provider]  gabapentin (NEURONTIN) 800 MG tablet Take 800 mg by mouth 3 (three) times daily as needed (pain).    Yes [provider]  hydrOXYzine (ATARAX/VISTARIL) 25 MG tablet Take 25 mg by mouth every 8 (eight) hours as needed for anxiety.  05/04/12  Yes  [provider]  hyoscyamine (LEVSIN, ANASPAZ) 0.125 MG tablet Take 0.125 mg by mouth every 4 (four) hours as needed for bladder spasms or cramping.    Yes [provider]  insulin detemir (LEVEMIR) 100 UNIT/ML injection Inject 45 Units into the skin at bedtime.   Yes [provider]  LORazepam (ATIVAN) 0.5 MG tablet Take 0.5 mg by mouth at bedtime as needed for anxiety or sleep.    Yes [provider]  metaxalone (SKELAXIN) 800 MG tablet Take 800 mg by mouth daily as needed for muscle spasms. 10/23/16  Yes [provider]  Milnacipran (SAVELLA) 50 MG TABS tablet Take 50 mg by mouth 2 (two) times daily.   Yes [provider]  montelukast (SINGULAIR) 10 MG tablet Take 10 mg by mouth at bedtime.  05/04/12  Yes [provider]  ondansetron (ZOFRAN-ODT) 8 MG disintegrating tablet Take 8 mg by mouth 2 (two) times daily as needed for nausea/vomiting. 10/23/16  Yes [provider]  Pancrelipase, Lip-Prot-Amyl, 25000 units CPEP Take 1 capsule by mouth daily.  12/28/14  Yes [provider]  ranitidine (ZANTAC) 150 MG capsule Take 150 mg by mouth daily as needed for heartburn.   Yes [provider]  sucralfate (CARAFATE) 1 GM/10ML suspension Take 1 g by mouth 4 (four) times daily -  with meals and at bedtime.   Yes [provider]  SUMAtriptan (IMITREX) 50 MG tablet Take 50 mg by mouth every 2 (two) hours as needed for migraine. May repeat in 2 hours if headache persists or recurs.   Yes [provider]  tiZANidine (ZANAFLEX) 4 MG tablet Take 4 mg by mouth daily as needed for muscle spasms.   Yes [provider]  traMADol (ULTRAM) 50 MG tablet Take 50 mg by mouth every 6 (six) hours as needed for moderate pain.    Yes [provider]  traZODone (DESYREL) 100 MG tablet Take 200 mg by mouth at bedtime.    Yes [provider]    Family History  Problem Relation Age of Onset  .  Hypertension Father   . Hyperlipidemia Father   . Heart attack Father      Social History   Tobacco Use  . Smoking status: Former Research scientist (life sciences)  . Smokeless tobacco: Never Used  Substance Use Topics  . Alcohol use: Yes    Alcohol/week: 1.8 oz    Types: 3 Shots of liquor per week    Comment: occassional  . Drug use: No    Allergies as of 06/15/2017 - Review Complete 06/15/2017  Allergen Reaction Noted  . Naproxen Other (See Comments)   . Penicillins Swelling, Rash, and Other (See Comments) 08/11/2014  . Gluten meal  11/28/2016  .  Lactose intolerance (gi)  11/28/2016  . Latex Itching 11/19/2016  . Other Diarrhea, Nausea And Vomiting, and Other (See Comments) 08/11/2014  . Shellfish allergy Other (See Comments) 08/11/2014  . Soy allergy Other (See Comments) 08/11/2014  . Sulfonylureas Other (See Comments) 08/11/2014  . Doxycycline Rash 02/18/2016  . Eggs or egg-derived products Other (See Comments) 09/24/2012  . Humira [adalimumab] Rash and Other (See Comments) 11/18/2014  . Peanut-containing drug products Rash 02/18/2016  . Simvastatin Rash 08/17/2012  . Statins Rash 11/18/2014  . Sulfa antibiotics Rash 11/18/2014  . Vancomycin Rash and Other (See Comments) 08/11/2014    Review of Systems:    All systems reviewed and negative except where noted in HPI.   Physical Exam:  Vital signs in last 24 hours: Temp:  [97.7 F (36.5 C)-97.8 F (36.6 C)] 97.8 F (36.6 C) (02/26 0512) Pulse Rate:  [95-113] 97 (02/26 0512) Resp:  [18] 18 (02/26 0512) BP: (130-166)/(68-88) 130/68 (02/26 0512) SpO2:  [94 %-99 %] 99 % (02/26 0512) Weight:  [145 lb (65.8 kg)-146 lb 3.2 oz (66.3 kg)] 146 lb 3.2 oz (66.3 kg) (02/25 2145)   General:   Pleasant, cooperative in NAD Head:  Normocephalic and atraumatic. Eyes:   No icterus.   Conjunctiva pink. PERRLA. Ears:  Normal auditory acuity. Neck:  Supple; no masses or thyroidomegaly Lungs: Respirations even and unlabored. Lungs clear to auscultation  bilaterally.   No wheezes, crackles, or rhonchi.  Heart:  Regular rate and rhythm;  Without murmur, clicks, rubs or gallops Abdomen:  Soft, nondistended, nontender. Normal bowel sounds. No appreciable masses or hepatomegaly.  No rebound or guarding. RLQ bag seen and central abdominal scar Neurologic:  Alert and oriented x3;  grossly normal neurologically. Skin:  Intact without significant lesions or rashes. Cervical Nodes:  No significant cervical adenopathy. Psych:  Alert and cooperative. Normal affect.  LAB RESULTS: Recent Labs    06/15/17 1746 06/16/17 0353  WBC 13.5* 8.9  HGB 12.4 11.7*  HCT 37.1 34.1*  PLT 355 301   BMET Recent Labs    06/15/17 1746 06/16/17 0353  NA 133* 139  K 4.6 3.7  CL 99* 106  CO2 22 26  GLUCOSE 445* 134*  BUN 27* 15  CREATININE 0.92 0.62  CALCIUM 8.6* 8.3*   LFT Recent Labs    06/15/17 1746  PROT 6.9  ALBUMIN 3.7  AST 32  ALT 16  ALKPHOS 85  BILITOT 0.2*   PT/INR Recent Labs    06/15/17 1746  LABPROT 11.1*  INR 0.81    STUDIES: Ct Abdomen Pelvis W Contrast  Result Date: 06/15/2017 CLINICAL DATA:  Acute generalized abdomen pain EXAM: CT ABDOMEN AND PELVIS WITH CONTRAST TECHNIQUE: Multidetector CT imaging of the abdomen and pelvis was performed using the standard protocol following bolus administration of intravenous contrast. CONTRAST:  192m ISOVUE-300 IOPAMIDOL (ISOVUE-300) INJECTION 61% COMPARISON:  November 18, 2014 FINDINGS: Lower chest: No acute abnormality. Hepatobiliary: There is diffuse low density of the liver. No focal liver lesion is identified. The patient status post prior cholecystectomy. Post surgical intra and extrahepatic biliary ductal dilatation is identified. The common bowel duct measures 1.3 cm. Pancreas: Unremarkable. No pancreatic ductal dilatation or surrounding inflammatory changes. Spleen: Normal in size without focal abnormality. Adrenals/Urinary Tract: The adrenal glands are normal. There is a small cyst in  lower pole left kidney. Bilateral parapelvic renal cysts are identified. There is no hydronephrosis bilaterally. The bladder is normal. Stomach/Bowel: There is a periosteal mole hernia containing several on obstructed  small bowel loops adjacent to the ileostomy unchanged compared to prior exam. There is no evidence of small bowel obstruction. Prior surgical changes of the bowel are stable. Vascular/Lymphatic: No significant vascular findings are present. No enlarged abdominal or pelvic lymph nodes. Reproductive: Status post hysterectomy. No adnexal masses. Other: None. Musculoskeletal: No acute or significant osseous findings. IMPRESSION: Right lower quadrant ileostomy with adjacent herniated nonobstructive bowel loop unchanged compared to prior exam. There is no evidence of bowel obstruction. No acute abnormality is identified in the abdomen and pelvis. Electronically Signed   By: Abelardo Diesel M.D.   On: 06/15/2017 19:04      Impression / Plan:   Caroline Hughes is a 57 y.o. y/o female with h/o Crohns disease , S/p colectomy and ileostomy on Cimzia, follows with [facility] , brought in for melena, stable Hb and no evidence of bowel inflammation on CT scan of the abdomen. Prior history of duodenal ulcers.   Plan  1. EGD and ileoscopy tomorrow. H/o duodenal ulcers, it may very well be related to the Crohns disease. If duodenal ulcers are still present then would indicate treatment failure with Cimzia and need to escalate to [place]. If nothing seen tomorrow on endoscopy will need outpatient MR enterogram to evaluate for inflammation of the small bowel   2. 1/2 gallon golytely or miralax gatorade prep to better visualize the ileum tomorrow  3. No NSAID's   Thank you for involving me in the care of this patient.      LOS: 1 day   [name], MD  06/16/2017, 9:12 AM

## 2017-06-17 ENCOUNTER — Inpatient Hospital Stay: Payer: Medicare Other | Admitting: Anesthesiology

## 2017-06-17 ENCOUNTER — Encounter
Admission: EM | Disposition: A | Discharge status: Home Health | Payer: Self-pay | Source: Home / Self Care | Attending: Internal Medicine

## 2017-06-17 DIAGNOSIS — K264 Chronic or unspecified duodenal ulcer with hemorrhage: Principal | ICD-10-CM

## 2017-06-17 HISTORY — PX: ESOPHAGOGASTRODUODENOSCOPY (EGD) WITH PROPOFOL: SHX5813

## 2017-06-17 HISTORY — PX: ILEOSCOPY: SHX5434

## 2017-06-17 LAB — CBC
HCT: 33.1 % — ABNORMAL LOW (ref 35.0–47.0)
Hemoglobin: 11.2 g/dL — ABNORMAL LOW (ref 12.0–16.0)
MCH: 32.3 pg (ref 26.0–34.0)
MCHC: 33.8 g/dL (ref 32.0–36.0)
MCV: 95.6 fL (ref 80.0–100.0)
PLATELETS: 293 10*3/uL (ref 150–440)
RBC: 3.46 MIL/uL — ABNORMAL LOW (ref 3.80–5.20)
RDW: 12.4 % (ref 11.5–14.5)
WBC: 5.7 10*3/uL (ref 3.6–11.0)

## 2017-06-17 LAB — HIV ANTIBODY (ROUTINE TESTING W REFLEX): HIV SCREEN 4TH GENERATION: NONREACTIVE

## 2017-06-17 LAB — BASIC METABOLIC PANEL
Anion gap: 9 (ref 5–15)
BUN: 9 mg/dL (ref 6–20)
CALCIUM: 8.3 mg/dL — AB (ref 8.9–10.3)
CO2: 24 mmol/L (ref 22–32)
CREATININE: 0.73 mg/dL (ref 0.44–1.00)
Chloride: 106 mmol/L (ref 101–111)
GFR calc Af Amer: >60 mL/min (ref >60)
GLUCOSE: 202 mg/dL — AB (ref 65–99)
Potassium: 3.9 mmol/L (ref 3.5–5.1)
Sodium: 139 mmol/L (ref 135–145)

## 2017-06-17 LAB — GLUCOSE, CAPILLARY
GLUCOSE-CAPILLARY: 239 mg/dL — AB (ref 65–99)
Glucose-Capillary: 154 mg/dL — ABNORMAL HIGH (ref 65–99)
Glucose-Capillary: 190 mg/dL — ABNORMAL HIGH (ref 65–99)
Glucose-Capillary: 235 mg/dL — ABNORMAL HIGH (ref 65–99)
Glucose-Capillary: 239 mg/dL — ABNORMAL HIGH (ref 65–99)
Glucose-Capillary: 250 mg/dL — ABNORMAL HIGH (ref 65–99)

## 2017-06-17 SURGERY — ESOPHAGOGASTRODUODENOSCOPY (EGD) WITH PROPOFOL
Anesthesia: General

## 2017-06-17 MED ORDER — PROPOFOL 500 MG/50ML IV EMUL
INTRAVENOUS | Status: DC | PRN
  Administered 2017-06-17: 150 ug/kg/min via INTRAVENOUS

## 2017-06-17 MED ORDER — LIDOCAINE HCL (CARDIAC) 20 MG/ML IV SOLN
INTRAVENOUS | Status: DC | PRN
  Administered 2017-06-17: 30 mg via INTRAVENOUS

## 2017-06-17 MED ORDER — FENTANYL CITRATE (PF) 100 MCG/2ML IJ SOLN
INTRAMUSCULAR | Status: DC | PRN
  Administered 2017-06-17: 25 ug via INTRAVENOUS
  Administered 2017-06-17: 50 ug via INTRAVENOUS
  Administered 2017-06-17: 25 ug via INTRAVENOUS

## 2017-06-17 MED ORDER — FENTANYL CITRATE (PF) 100 MCG/2ML IJ SOLN
INTRAMUSCULAR | Status: AC
  Filled 2017-06-17: qty 2

## 2017-06-17 MED ORDER — INSULIN GLARGINE 100 UNIT/ML ~~LOC~~ SOLN
10.0000 [IU] | Freq: Every day | SUBCUTANEOUS | Status: DC
  Administered 2017-06-17: 10 [IU] via SUBCUTANEOUS
  Filled 2017-06-17: qty 0.1

## 2017-06-17 MED ORDER — SODIUM CHLORIDE 0.9 % IJ SOLN
PREFILLED_SYRINGE | INTRAMUSCULAR | Status: DC | PRN
  Administered 2017-06-17: 6 mL

## 2017-06-17 MED ORDER — PROPOFOL 10 MG/ML IV BOLUS
INTRAVENOUS | Status: DC | PRN
  Administered 2017-06-17 (×2): 20 mg via INTRAVENOUS
  Administered 2017-06-17: 70 mg via INTRAVENOUS
  Administered 2017-06-17: 20 mg via INTRAVENOUS

## 2017-06-17 MED ORDER — MIDAZOLAM HCL 2 MG/2ML IJ SOLN
INTRAMUSCULAR | Status: DC | PRN
  Administered 2017-06-17: 2 mg via INTRAVENOUS

## 2017-06-17 MED ORDER — SODIUM CHLORIDE 0.9 % IV SOLN
INTRAVENOUS | Status: DC
  Administered 2017-06-17: 15:00:00 via INTRAVENOUS

## 2017-06-17 MED ORDER — EPINEPHRINE PF 1 MG/10ML IJ SOSY
PREFILLED_SYRINGE | INTRAMUSCULAR | Status: AC
  Filled 2017-06-17: qty 10

## 2017-06-17 MED ORDER — MIDAZOLAM HCL 2 MG/2ML IJ SOLN
INTRAMUSCULAR | Status: AC
  Filled 2017-06-17: qty 2

## 2017-06-17 NOTE — Anesthesia Preprocedure Evaluation (Signed)
Anesthesia Evaluation  Patient identified by MRN, date of birth, ID band Patient awake    Reviewed: Allergy & Precautions, NPO status , Patient's Chart, lab work & pertinent test results  History of Anesthesia Complications (+) PONV and history of anesthetic complications  Airway Mallampati: II  TM Distance: >3 FB Neck ROM: Full    Dental  (+) Chipped, Poor Dentition   Pulmonary asthma , neg sleep apnea, former smoker,    breath sounds clear to auscultation- rhonchi (-) wheezing      Cardiovascular Exercise Tolerance: Good (-) hypertension(-) CAD, (-) Past MI, (-) Cardiac Stents and (-) CABG  Rhythm:Regular Rate:Normal - Systolic murmurs and - Diastolic murmurs    Neuro/Psych  Headaches, PSYCHIATRIC DISORDERS Anxiety Depression    GI/Hepatic Neg liver ROS, PUD, GERD  ,  Endo/Other  diabetes, Insulin Dependent  Renal/GU negative Renal ROS     Musculoskeletal  (+) Arthritis , Fibromyalgia -  Abdominal (+) - obese,   Peds  Hematology  (+) anemia , JEHOVAH'S WITNESS  Anesthesia Other Findings Past Medical History: No date: Anemia     Comment:  Iron deficiency IRON INFUSIONS X 5 WEEKS No date: Anxiety No date: Arthritis No date: Asthma 2017: Bronchitis No date: Complication of anesthesia No date: Crohn's disease ([place]) No date: Depression No date: Diabetes mellitus without complication ([facility]) No date: Endometriosis No date: Fibromyalgia No date: Gastric ulcer No date: GERD (gastroesophageal reflux disease) No date: Herniated disc, cervical No date: Hyperlipemia No date: Intestinal obstruction ([facility]) No date: Migraine     Comment:  CHRONIC No date: Nausea     Comment:  CHRONIC No date: Neuropathy No date: Pain     Comment:  BACK No date: Peristomal hernia No date: PONV (postoperative nausea and vomiting) No date: Ulcerative colitis ([place])   Reproductive/Obstetrics                              Anesthesia Physical Anesthesia Plan  ASA: III  Anesthesia Plan: General   Post-op Pain Management:    Induction: Intravenous  PONV Risk Score and Plan: 3 and Propofol infusion  Airway Management Planned: Natural Airway  Additional Equipment:   Intra-op Plan:   Post-operative Plan:   Informed Consent: I have reviewed the patients History and Physical, chart, labs and discussed the procedure including the risks, benefits and alternatives for the proposed anesthesia with the patient or authorized representative who has indicated his/her understanding and acceptance.   Dental advisory given  Plan Discussed with: CRNA and Anesthesiologist  Anesthesia Plan Comments:         Anesthesia Quick Evaluation

## 2017-06-17 NOTE — Progress Notes (Signed)
Inpatient Diabetes Program Recommendations  AACE/ADA: New Consensus Statement on Inpatient Glycemic Control (2015)  Target Ranges:  Prepandial:   less than 140 mg/dL      Peak postprandial:   less than 180 mg/dL (1-2 hours)      Critically ill patients:  140 - 180 mg/dL   Results for DIVINE, IMBER (MRN [id number]) as of 06/17/2017 09:28  Ref. Range 06/16/2017 06:13 06/16/2017 11:40 06/16/2017 18:26 06/17/2017 00:08 06/17/2017 05:55  Glucose-Capillary Latest Ref Range: 65 - 99 mg/dL 169 (H) 156 (H) 319 (H) 154 (H) 235 (H)   Review of Glycemic Control  Diabetes history: DM2 Outpatient Diabetes medications: Levemir 45 units QHS Current orders for Inpatient glycemic control: Novolog 0-9 units Q6H  Inpatient Diabetes Program Recommendations: Insulin - Basal: Please consider ordering Levemir 7 units Q24H starting now (based on 66 kg x 0.1 units).  Thanks, Barnie Alderman, RN, MSN, CDE Diabetes Coordinator Inpatient Diabetes Program [phone] (Team Pager from 8am to 5pm)

## 2017-06-17 NOTE — Progress Notes (Signed)
[facility] [place] NAME: Caroline Hughes    MR#:  [id number]  DATE OF BIRTH:  10-07-1960  SUBJECTIVE:  CHIEF COMPLAINT:   Chief Complaint  Patient presents with  . Melena   -doing well, no melena today - still has abdominal pain - for EGD and ileoscopy today  REVIEW OF SYSTEMS:  Review of Systems  Constitutional: Negative for chills, fever and malaise/fatigue.  HENT: Negative for congestion, ear discharge, hearing loss and nosebleeds.   Eyes: Negative for blurred vision, double vision and photophobia.  Respiratory: Negative for cough, shortness of breath and wheezing.   Cardiovascular: Negative for chest pain, palpitations and leg swelling.  Gastrointestinal: Positive for abdominal pain and melena. Negative for constipation, diarrhea, nausea and vomiting.  Genitourinary: Negative for dysuria.  Musculoskeletal: Negative for myalgias.  Neurological: Negative for dizziness, speech change, focal weakness, seizures and headaches.  Psychiatric/Behavioral: Negative for depression.    DRUG ALLERGIES:   Allergies  Allergen Reactions  . Naproxen Other (See Comments)    Other Reaction: GI bleed  . Penicillins Swelling, Rash and Other (See Comments)    Reaction: fever Has patient had a PCN reaction causing immediate rash, facial/tongue/throat swelling, SOB or lightheadedness with hypotension: Yes Has patient had a PCN reaction causing severe rash involving mucus membranes or skin necrosis: No Has patient had a PCN reaction that required hospitalization: No Has patient had a PCN reaction occurring within the last 10 years: No If all of the above answers are "NO", then may proceed with Cephalosporin use.   . Gluten Meal     Bloating, gas, pain  . Lactose Intolerance (Gi)     Bloating, gas, pain  . Latex Itching    redness  . Other Diarrhea, Nausea And Vomiting and Other (See Comments)    "DIGESTIVE ISSUES" Nuts  . Shellfish  Allergy Other (See Comments)    Reaction: "I get stuffed up, not a true allergy"  . Soy Allergy Other (See Comments)    Reaction: digestive issues.  . Sulfonylureas Other (See Comments)    Reaction: Pt isn't sure what medication this one was associated with or what the reaction was.  . Doxycycline Rash  . Eggs Or Egg-Derived Products Other (See Comments)    Nasal congestion  . Humira [Adalimumab] Rash and Other (See Comments)    Reaction: fever.  . Peanut-Containing Drug Products Rash  . Simvastatin Rash  . Statins Rash  . Sulfa Antibiotics Rash  . Vancomycin Rash and Other (See Comments)    Reaction: fever    VITALS:  Blood pressure (!) 147/69, pulse (!) 105, temperature (!) 97.1 F (36.2 C), temperature source Tympanic, resp. rate 18, height 5' 2"  (1.575 m), weight 66.2 kg (146 lb), SpO2 100 %.  PHYSICAL EXAMINATION:  Physical Exam  GENERAL:  57 y.o.-year-old patient lying in the bed with no acute distress.  EYES: Pupils equal, round, reactive to light and accommodation. No scleral icterus. Extraocular muscles intact.  HEENT: Head atraumatic, normocephalic. Oropharynx and nasopharynx clear.  NECK:  Supple, no jugular venous distention. No thyroid enlargement, no tenderness.  LUNGS: Normal breath sounds bilaterally, no wheezing, rales,rhonchi or crepitation. No use of accessory muscles of respiration.  CARDIOVASCULAR: S1, S2 normal. No murmurs, rubs, or gallops.  ABDOMEN: Soft, but tender in left upper quadrant and around the umbilicus, nondistended. Ileostomy bag in place with loose ark stool Bowel sounds present. No organomegaly or mass.  EXTREMITIES: No pedal edema, cyanosis, or  clubbing.  NEUROLOGIC: Cranial nerves II through XII are intact. Muscle strength 5/5 in all extremities. Sensation intact. Gait not checked.  PSYCHIATRIC: The patient is alert and oriented x 3.  SKIN: No obvious rash, lesion, or ulcer.    LABORATORY PANEL:   CBC Recent Labs  Lab 06/17/17 0400    WBC 5.7  HGB 11.2*  HCT 33.1*  PLT 293   ------------------------------------------------------------------------------------------------------------------  Chemistries  Recent Labs  Lab 06/15/17 1746  06/17/17 0400  NA 133*   < > 139  K 4.6   < > 3.9  CL 99*   < > 106  CO2 22   < > 24  GLUCOSE 445*   < > 202*  BUN 27*   < > 9  CREATININE 0.92   < > 0.73  CALCIUM 8.6*   < > 8.3*  AST 32  --   --   ALT 16  --   --   ALKPHOS 85  --   --   BILITOT 0.2*  --   --    < > = values in this interval not displayed.   ------------------------------------------------------------------------------------------------------------------  Cardiac Enzymes No results for input(s): TROPONINI in the last 168 hours. ------------------------------------------------------------------------------------------------------------------  RADIOLOGY:  Ct Abdomen Pelvis W Contrast  Result Date: 06/15/2017 CLINICAL DATA:  Acute generalized abdomen pain EXAM: CT ABDOMEN AND PELVIS WITH CONTRAST TECHNIQUE: Multidetector CT imaging of the abdomen and pelvis was performed using the standard protocol following bolus administration of intravenous contrast. CONTRAST:  156m ISOVUE-300 IOPAMIDOL (ISOVUE-300) INJECTION 61% COMPARISON:  November 18, 2014 FINDINGS: Lower chest: No acute abnormality. Hepatobiliary: There is diffuse low density of the liver. No focal liver lesion is identified. The patient status post prior cholecystectomy. Post surgical intra and extrahepatic biliary ductal dilatation is identified. The common bowel duct measures 1.3 cm. Pancreas: Unremarkable. No pancreatic ductal dilatation or surrounding inflammatory changes. Spleen: Normal in size without focal abnormality. Adrenals/Urinary Tract: The adrenal glands are normal. There is a small cyst in lower pole left kidney. Bilateral parapelvic renal cysts are identified. There is no hydronephrosis bilaterally. The bladder is normal. Stomach/Bowel: There is  a periosteal mole hernia containing several on obstructed small bowel loops adjacent to the ileostomy unchanged compared to prior exam. There is no evidence of small bowel obstruction. Prior surgical changes of the bowel are stable. Vascular/Lymphatic: No significant vascular findings are present. No enlarged abdominal or pelvic lymph nodes. Reproductive: Status post hysterectomy. No adnexal masses. Other: None. Musculoskeletal: No acute or significant osseous findings. IMPRESSION: Right lower quadrant ileostomy with adjacent herniated nonobstructive bowel loop unchanged compared to prior exam. There is no evidence of bowel obstruction. No acute abnormality is identified in the abdomen and pelvis. Electronically Signed   By: WAbelardo DieselM.D.   On: 06/15/2017 19:04    EKG:   Orders placed or performed in visit on 12/16/14  . EKG 12-Lead  . EKG 12-Lead  . EKG 12-Lead    ASSESSMENT AND PLAN:   57year old female with past medical history significant for ulcerative colitis status post total colectomy, ileostomy, Crohn's disease, arthritis, diabetes, GERD presents to hospital secondary to abdominal pain and melena  1. Melena-could be from her Crohn's disease. Also had a history of gastric ulcers. -GI consult. For EGD and ileoscopy today - If negative- MR enterogram as outpatient to evaluate crohn's ulcers -On IV Protonix and fluids for now. -Hemoglobin is stable. -CT of the abdomen on admission showing nonobstructive bowel loop pattern. No other acute  abnormality noted.  2. Crohn's disease-recently finished a prednisone taper. -On Bentyl, Creon, Levsin and pain medications.  3. Diabetes mellitus-sliding scale insulin while nothing by mouth. Takes Levemir 45 units at bedtime.  - will start low dose tonight  4. Chronic abdominal pain-continue pain medications  5. DVT prophylaxis-Ted's and SCDs   Patient is ambulatory   All the records are reviewed and case discussed with Care  Management/Social Workerr. Management plans discussed with the patient, family and they are in agreement.  CODE STATUS: Full code  TOTAL TIME TAKING CARE OF THIS PATIENT: 33 minutes.   POSSIBLE D/C IN 1-2 DAYS, DEPENDING ON CLINICAL CONDITION.   Gladstone Lighter M.D on 06/17/2017 at 2:34 PM  Between 7am to 6pm - Pager - [phone]  After 6pm go to [email or web address] - password [email or web address] [facility]  Office  [phone]  CC: Primary care physician; Sharyne Peach, MD

## 2017-06-17 NOTE — Transfer of Care (Signed)
Immediate Anesthesia Transfer of Care Note  Patient: Caroline Hughes  Procedure(s) Performed: Procedure(s): ESOPHAGOGASTRODUODENOSCOPY (EGD) WITH PROPOFOL (N/A) ILEOSCOPY THROUGH STOMA (N/A)  Patient Location: PACU and Endoscopy Unit  Anesthesia Type:General  Level of Consciousness: sedated  Airway & Oxygen Therapy: Patient Spontanous Breathing and Patient connected to nasal cannula oxygen  Post-op Assessment: Report given to RN and Post -op Vital signs reviewed and stable  Post vital signs: Reviewed and stable  Last Vitals:  Vitals:   06/17/17 1549 06/17/17 1552  BP: (!) 146/77 (!) 146/77  Pulse: (!) 112 (!) 109  Resp: 18 (!) 22  Temp: (!) 36.2 C (!) 36.1 C  SpO2: 54% 650%    Complications: No apparent anesthesia complications

## 2017-06-17 NOTE — Anesthesia Procedure Notes (Signed)
Date/Time: 06/17/2017 3:11 PM Performed by: Doreen Salvage, CRNA Pre-anesthesia Checklist: Patient identified, Emergency Drugs available, Suction available and Patient being monitored Patient Re-evaluated:Patient Re-evaluated prior to induction Oxygen Delivery Method: Nasal cannula Induction Type: IV induction Dental Injury: Teeth and Oropharynx as per pre-operative assessment  Comments: Nasal cannula with etCO2 monitoring

## 2017-06-17 NOTE — Op Note (Signed)
[facility] Gastroenterology Patient Name: Caroline Hughes Procedure Date: 06/17/2017 3:10 PM MRN: [id number] Account #: [id number] Date of Birth: 09-Dec-1960 Admit Type: Inpatient Age: 57 Room: [facility] ENDO ROOM 1 Gender: Female Note Status: Finalized Procedure:            Upper GI endoscopy Indications:          Melena Providers:            [name] MD, MD Referring MD:         Rubbie Battiest. Iona Beard MD, MD (Referring MD) Medicines:            Monitored Anesthesia Care Complications:        No immediate complications. Procedure:            Pre-Anesthesia Assessment:                       - ASA Grade Assessment: III - A patient with severe                        systemic disease.                       After obtaining informed consent, the endoscope was                        passed under direct vision. Throughout the procedure,                        the patient's blood pressure, pulse, and oxygen                        saturations were monitored continuously. The Endoscope                        was introduced through the mouth, and advanced to the                        third part of duodenum. The upper GI endoscopy was                        accomplished with ease. The patient tolerated the                        procedure well. Findings:      The esophagus was normal.      Hematin (altered blood/coffee-ground-like material) was found in the       gastric body. Lavage of the area was performed using a moderate amount,       resulting in clearance with adequate visualization.      Red blood was found in the entire duodenum. Lavage of the area was       performed using a large amount, resulting in clearance with fair       visualization. The duodenal bulb and second portion Area was [Result]       injected with 5 mL of a 1:10,000 solution of epinephrine for hemostasis.      One oozing cratered duodenal ulcer with oozing hemorrhage (Forrest Class       Ib) was found  in the duodenal bulb. The lesion was 6 mm in largest       dimension. For hemostasis, one hemostatic clip  was successfully placed.       There was no bleeding at the end of the maneuver. The duodenal bulb area       was injected with epinephrine to obtain better visualization ,       subsequently bleeding stopped and I was able to see the ulcer in the       bulb that I clipped. Subsequently I washed the entire duodenum and       waited and noted no further bleeding . Impression:           - Normal esophagus.                       - Hematin (altered blood/coffee-ground-like material)                        in the gastric body.                       - Blood in the entire examined duodenum. Injected.                       - One oozing duodenal ulcer with oozing hemorrhage                        (Forrest Class Ib). Clip was placed.                       - No specimens collected. Recommendation:       - Return patient to hospital ward for ongoing care.                       - NPO today.                       - 1. Keep NPO today                       2. I expect her stoma bag to fill with some melena as                        there was a lot of blood in the duodenum but it should                        clear.                       3. Monitor CBC closely and transfuse as needed                       4. If concern for active bleed, get tagged RBC scan and                        if positive can attempt vascular embolization                       5. Check H pylori stool antigen                       6. Repeat EGD in 6 weeks and take biopsies to rule out  duodenal crohns disease and if present will need                        escalation of therapy. Procedure Code(s):    --- Professional ---                       [phone], Esophagogastroduodenoscopy, flexible, transoral;                        with control of bleeding, any method Diagnosis Code(s):    --- Professional ---                        K92.2, Gastrointestinal hemorrhage, unspecified                       K26.4, Chronic or unspecified duodenal ulcer with                        hemorrhage                       K92.1, Melena (includes Hematochezia) CPT copyright 2016 American Medical Association. All rights reserved. The codes documented in this report are preliminary and upon coder review may  be revised to meet current compliance requirements. [name], MD [name] MD, MD 06/17/2017 3:46:00 PM This report has been signed electronically. Number of Addenda: 0 Note Initiated On: 06/17/2017 3:10 PM      [facility]

## 2017-06-17 NOTE — Anesthesia Post-op Follow-up Note (Signed)
Anesthesia QCDR form completed.        

## 2017-06-17 NOTE — Anesthesia Postprocedure Evaluation (Signed)
Anesthesia Post Note  Patient: Caroline Hughes  Procedure(s) Performed: ESOPHAGOGASTRODUODENOSCOPY (EGD) WITH PROPOFOL (N/A ) ILEOSCOPY THROUGH STOMA (N/A )  Patient location during evaluation: Endoscopy Anesthesia Type: General Level of consciousness: awake and alert Pain management: pain level controlled Vital Signs Assessment: post-procedure vital signs reviewed and stable Respiratory status: spontaneous breathing and respiratory function stable Cardiovascular status: stable Anesthetic complications: no     Last Vitals:  Vitals:   06/17/17 1609 06/17/17 1619  BP: (!) 145/80   Pulse: (!) 104 (!) 106  Resp: 13 17  Temp:    SpO2: 100% 100%    Last Pain:  Vitals:   06/17/17 1619  TempSrc:   PainSc: 9                  KEPHART,WILLIAM K

## 2017-06-17 NOTE — OR Nursing (Signed)
The ileoscopy was cancelled per Dr Vicente Males d/t located and stopped bleeding in duodenum/

## 2017-06-17 NOTE — H&P (Signed)
[name], MD [address], [place], [place], [place], [address] [address], [place], [place], [place], [address] Phone: [phone]  Fax: [phone]  Primary Care Physician:  Sharyne Peach, MD   Pre-Procedure History & Physical: HPI:  Caroline Hughes is a 57 y.o. female is here for an endoscopy and ileoscoopy   Past Medical History:  Diagnosis Date  . Anemia    Iron deficiency IRON INFUSIONS X 5 WEEKS  . Anxiety   . Arthritis   . Asthma   . Bronchitis 2017  . Complication of anesthesia   . Crohn's disease ([place])   . Depression   . Diabetes mellitus without complication ([place])   . Endometriosis   . Fibromyalgia   . Gastric ulcer   . GERD (gastroesophageal reflux disease)   . Herniated disc, cervical   . Hyperlipemia   . Intestinal obstruction ([place])   . Migraine    CHRONIC  . Nausea    CHRONIC  . Neuropathy   . Pain    BACK  . Peristomal hernia   . PONV (postoperative nausea and vomiting)   . Ulcerative colitis [facility])     Past Surgical History:  Procedure Laterality Date  . ABDOMINAL HYSTERECTOMY    . CATARACT EXTRACTION W/PHACO Right 11/25/2016   Procedure: CATARACT EXTRACTION PHACO AND INTRAOCULAR LENS PLACEMENT (IOC);  Surgeon: Birder Robson, MD;  Location: [facility] ORS;  Service: Ophthalmology;  Laterality: Right;  [place] 00:40 AP% 17.4 CDE 6.94 Fluid pack lot # [id number]  . CATARACT EXTRACTION W/PHACO Left 12/02/2016   Procedure: CATARACT EXTRACTION PHACO AND INTRAOCULAR LENS PLACEMENT ([place]);  Surgeon: Birder Robson, MD;  Location: [facility] ORS;  Service: Ophthalmology;  Laterality: Left;  [place] 00:34 AP% 14.2 CDE 4.94 Flyuid pack lot 3 [id number]  . CHOLECYSTECTOMY    . COLON RESECTION    . HERNIA REPAIR     MULTIPLE PERISTOMAL  . ILEOSTOMY Bilateral    MOVED X 3  . rectal fistula packing     X 3    Prior to Admission medications   Medication Sig Start Date End Date Taking? Authorizing Provider  albuterol (PROVENTIL HFA;VENTOLIN HFA) 108  (90 BASE) MCG/ACT inhaler Inhale 2 puffs into the lungs every 6 (six) hours as needed for wheezing or shortness of breath.   Yes [provider]  albuterol (PROVENTIL) (2.5 MG/3ML) 0.083% nebulizer solution Inhale 3 mLs into the lungs every 6 (six) hours as needed for shortness of breath. 10/29/16  Yes [provider]  baclofen (LIORESAL) 10 MG tablet Take 10 mg by mouth 2 (two) times daily.  02/13/16  Yes [provider]  Certolizumab Pegol (CIMZIA) 2 X 200 MG KIT Inject 400 mg into the skin every 30 (thirty) days. Once a month on the 13th   Yes [provider]  cyanocobalamin (,VITAMIN B-12,) 1000 MCG/ML injection Inject 1,000 mcg into the muscle every 30 (thirty) days.  09/11/15  Yes [provider]  dicyclomine (BENTYL) 10 MG capsule Take 10 mg by mouth every morning.  01/02/16  Yes [provider]  gabapentin (NEURONTIN) 800 MG tablet Take 800 mg by mouth 3 (three) times daily as needed (pain).    Yes [provider]  hydrOXYzine (ATARAX/VISTARIL) 25 MG tablet Take 25 mg by mouth every 8 (eight) hours as needed for anxiety.  05/04/12  Yes [provider]  hyoscyamine (LEVSIN, ANASPAZ) 0.125 MG tablet Take 0.125 mg by mouth every 4 (four) hours as needed for bladder  spasms or cramping.    Yes [provider]  insulin detemir (LEVEMIR) 100 UNIT/ML injection Inject 45 Units into the skin at bedtime.   Yes [provider]  LORazepam (ATIVAN) 0.5 MG tablet Take 0.5 mg by mouth at bedtime as needed for anxiety or sleep.    Yes [provider]  metaxalone (SKELAXIN) 800 MG tablet Take 800 mg by mouth daily as needed for muscle spasms. 10/23/16  Yes [provider]  Milnacipran (SAVELLA) 50 MG TABS tablet Take 50 mg by mouth 2 (two) times daily.   Yes [provider]  montelukast (SINGULAIR) 10 MG tablet Take 10 mg by mouth at bedtime.  05/04/12  Yes [provider]  ondansetron  (ZOFRAN-ODT) 8 MG disintegrating tablet Take 8 mg by mouth 2 (two) times daily as needed for nausea/vomiting. 10/23/16  Yes [provider]  Pancrelipase, Lip-Prot-Amyl, 25000 units CPEP Take 1 capsule by mouth daily.  12/28/14  Yes [provider]  ranitidine (ZANTAC) 150 MG capsule Take 150 mg by mouth daily as needed for heartburn.   Yes [provider]  sucralfate (CARAFATE) 1 GM/10ML suspension Take 1 g by mouth 4 (four) times daily -  with meals and at bedtime.   Yes [provider]  SUMAtriptan (IMITREX) 50 MG tablet Take 50 mg by mouth every 2 (two) hours as needed for migraine. May repeat in 2 hours if headache persists or recurs.   Yes [provider]  tiZANidine (ZANAFLEX) 4 MG tablet Take 4 mg by mouth daily as needed for muscle spasms.   Yes [provider]  traMADol (ULTRAM) 50 MG tablet Take 50 mg by mouth every 6 (six) hours as needed for moderate pain.    Yes [provider]  traZODone (DESYREL) 100 MG tablet Take 200 mg by mouth at bedtime.    Yes [provider]    Allergies as of 06/15/2017 - Review Complete 06/15/2017  Allergen Reaction Noted  . Naproxen Other (See Comments)   . Penicillins Swelling, Rash, and Other (See Comments) 08/11/2014  . Gluten meal  11/28/2016  . Lactose intolerance (gi)  11/28/2016  . Latex Itching 11/19/2016  . Other Diarrhea, Nausea And Vomiting, and Other (See Comments) 08/11/2014  . Shellfish allergy Other (See Comments) 08/11/2014  . Soy allergy Other (See Comments) 08/11/2014  . Sulfonylureas Other (See Comments) 08/11/2014  . Doxycycline Rash 02/18/2016  . Eggs or egg-derived products Other (See Comments) 09/24/2012  . Humira [adalimumab] Rash and Other (See Comments) 11/18/2014  . Peanut-containing drug products Rash 02/18/2016  . Simvastatin Rash 08/17/2012  . Statins Rash 11/18/2014  . Sulfa antibiotics Rash 11/18/2014  . Vancomycin Rash and Other (See Comments)  08/11/2014    Family History  Problem Relation Age of Onset  . Hypertension Father   . Hyperlipidemia Father   . Heart attack Father     Social History   Socioeconomic History  . Marital status: Widowed    Spouse name: Not on file  . Number of children: Not on file  . Years of education: Not on file  . Highest education level: Not on file  Social Needs  . Financial resource strain: Not on file  . Food insecurity - worry: Not on file  . Food insecurity - inability: Not on file  . Transportation needs - medical: Not on file  . Transportation needs - non-medical: Not on file  Occupational History  . Not on file  Tobacco Use  . Smoking  status: Former Research scientist (life sciences)  . Smokeless tobacco: Never Used  Substance and Sexual Activity  . Alcohol use: Yes    Alcohol/week: 1.8 oz    Types: 3 Shots of liquor per week    Comment: occassional  . Drug use: No  . Sexual activity: Not on file  Other Topics Concern  . Not on file  Social History Narrative  . Not on file    Review of Systems: See HPI, otherwise negative ROS  Physical Exam: BP (!) 147/69   Pulse (!) 105   Temp (!) 97.1 F (36.2 C) (Tympanic)   Resp 18   Ht 5' 2"  (1.575 m)   Wt 146 lb (66.2 kg)   SpO2 100%   BMI 26.70 kg/m  General:   Alert,  pleasant and cooperative in NAD Head:  Normocephalic and atraumatic. Neck:  Supple; no masses or thyromegaly. Lungs:  Clear throughout to auscultation, normal respiratory effort.    Heart:  +S1, +S2, Regular rate and rhythm, No edema. Abdomen:  Soft, nontender and nondistended. Normal bowel sounds, without guarding, and without rebound.  Ostomy bag noted Neurologic:  Alert and  oriented x4;  grossly normal neurologically.  Impression/Plan: Caroline Hughes is here for an endoscopy and ileoscopy  to be performed for  evaluation of melena    Risks, benefits, limitations, and alternatives regarding endoscopy have been reviewed with the patient.  Questions have been answered.   All parties agreeable.   [name], MD  06/17/2017, 2:01 PM

## 2017-06-18 ENCOUNTER — Encounter: Payer: Self-pay | Admitting: Gastroenterology

## 2017-06-18 ENCOUNTER — Inpatient Hospital Stay: Payer: Medicare Other

## 2017-06-18 DIAGNOSIS — K921 Melena: Secondary | ICD-10-CM

## 2017-06-18 LAB — CBC
HCT: 28.1 % — ABNORMAL LOW (ref 35.0–47.0)
HEMOGLOBIN: 9.8 g/dL — AB (ref 12.0–16.0)
MCH: 33.5 pg (ref 26.0–34.0)
MCHC: 35 g/dL (ref 32.0–36.0)
MCV: 95.6 fL (ref 80.0–100.0)
PLATELETS: 298 10*3/uL (ref 150–440)
RBC: 2.94 MIL/uL — AB (ref 3.80–5.20)
RDW: 12.8 % (ref 11.5–14.5)
WBC: 10.5 10*3/uL (ref 3.6–11.0)

## 2017-06-18 LAB — HEMOGLOBIN AND HEMATOCRIT, BLOOD
HCT: 20.1 % — ABNORMAL LOW (ref 35.0–47.0)
Hemoglobin: 6.9 g/dL — ABNORMAL LOW (ref 12.0–16.0)

## 2017-06-18 LAB — GLUCOSE, CAPILLARY
GLUCOSE-CAPILLARY: 181 mg/dL — AB (ref 65–99)
GLUCOSE-CAPILLARY: 148 mg/dL — AB (ref 65–99)
GLUCOSE-CAPILLARY: 180 mg/dL — AB (ref 65–99)
Glucose-Capillary: 288 mg/dL — ABNORMAL HIGH (ref 65–99)
Glucose-Capillary: 244 mg/dL — ABNORMAL HIGH (ref 65–99)

## 2017-06-18 LAB — MRSA PCR SCREENING: MRSA by PCR: NEGATIVE

## 2017-06-18 MED ORDER — PROMETHAZINE HCL 25 MG/ML IJ SOLN
25.0000 mg | Freq: Four times a day (QID) | INTRAMUSCULAR | Status: DC | PRN
  Administered 2017-06-18 – 2017-06-22 (×7): 25 mg via INTRAVENOUS
  Filled 2017-06-18 (×11): qty 1

## 2017-06-18 MED ORDER — INSULIN GLARGINE 100 UNIT/ML ~~LOC~~ SOLN
12.0000 [IU] | Freq: Every day | SUBCUTANEOUS | Status: DC
  Administered 2017-06-18: 12 [IU] via SUBCUTANEOUS
  Filled 2017-06-18: qty 0.12

## 2017-06-18 MED ORDER — METOCLOPRAMIDE HCL 5 MG/ML IJ SOLN: 10.0000 mg | Freq: Four times a day (QID) | INTRAMUSCULAR | Status: DC

## 2017-06-18 MED ORDER — TECHNETIUM TC 99M-LABELED RED BLOOD CELLS IV KIT
25.0000 | PACK | Freq: Once | INTRAVENOUS | Status: AC | PRN
  Administered 2017-06-18: 26.1 via INTRAVENOUS

## 2017-06-18 MED ORDER — SODIUM CHLORIDE 0.9 % IV BOLUS (SEPSIS)
500.0000 mL | Freq: Once | INTRAVENOUS | Status: AC
  Administered 2017-06-18: 500 mL via INTRAVENOUS

## 2017-06-18 NOTE — Progress Notes (Addendum)
Pt c/o nausea. Administered 40m Zofran IV. 10 minutes later, pt visitor notified me that pt was vomiting. Vomit appears green in color. Notified Dr KTressia Miners  Dr KTressia Minersplaced order for phenergan. Given.  1000 meds not given due to active vomiting.

## 2017-06-18 NOTE — Progress Notes (Signed)
   06/18/17 1553  Clinical Encounter Type  Visited With Patient not available  Visit Type Code (rapid response became code blue then cleared)   Chaplain responded to code blue; upon arrival, code was clear

## 2017-06-18 NOTE — Progress Notes (Addendum)
[place] [facility] [place] NAME: Caroline Hughes    MR#:  [id number]  DATE OF BIRTH:  11-26-1960  SUBJECTIVE:  CHIEF COMPLAINT:   Chief Complaint  Patient presents with  . Melena   - oozing duodenal ulcer on EGD yesterday -remains NPO, hb drop noted, patient is Fara Boros witness - monitor, still has some melena as expected  REVIEW OF SYSTEMS:  Review of Systems  Constitutional: Negative for chills, fever and malaise/fatigue.  HENT: Negative for congestion, ear discharge, hearing loss and nosebleeds.   Eyes: Negative for blurred vision, double vision and photophobia.  Respiratory: Negative for cough, shortness of breath and wheezing.   Cardiovascular: Negative for chest pain, palpitations and leg swelling.  Gastrointestinal: Positive for abdominal pain, melena and nausea. Negative for constipation, diarrhea and vomiting.  Genitourinary: Negative for dysuria.  Musculoskeletal: Negative for myalgias.  Neurological: Negative for dizziness, speech change, focal weakness, seizures and headaches.  Psychiatric/Behavioral: Negative for depression.    DRUG ALLERGIES:   Allergies  Allergen Reactions  . Naproxen Other (See Comments)    Other Reaction: GI bleed  . Penicillins Swelling, Rash and Other (See Comments)    Reaction: fever Has patient had a PCN reaction causing immediate rash, facial/tongue/throat swelling, SOB or lightheadedness with hypotension: Yes Has patient had a PCN reaction causing severe rash involving mucus membranes or skin necrosis: No Has patient had a PCN reaction that required hospitalization: No Has patient had a PCN reaction occurring within the last 10 years: No If all of the above answers are "NO", then may proceed with Cephalosporin use.   . Gluten Meal     Bloating, gas, pain  . Lactose Intolerance (Gi)     Bloating, gas, pain  . Latex Itching    redness  . Other Diarrhea, Nausea And Vomiting and Other (See  Comments)    "DIGESTIVE ISSUES" Nuts  . Shellfish Allergy Other (See Comments)    Reaction: "I get stuffed up, not a true allergy"  . Soy Allergy Other (See Comments)    Reaction: digestive issues.  . Sulfonylureas Other (See Comments)    Reaction: Pt isn't sure what medication this one was associated with or what the reaction was.  . Doxycycline Rash  . Eggs Or Egg-Derived Products Other (See Comments)    Nasal congestion  . Humira [Adalimumab] Rash and Other (See Comments)    Reaction: fever.  . Peanut-Containing Drug Products Rash  . Simvastatin Rash  . Statins Rash  . Sulfa Antibiotics Rash  . Vancomycin Rash and Other (See Comments)    Reaction: fever    VITALS:  Blood pressure 129/71, pulse (!) 102, temperature 97.7 F (36.5 C), temperature source Oral, resp. rate 20, height 5' 2"  (1.575 m), weight 66.2 kg (146 lb), SpO2 99 %.  PHYSICAL EXAMINATION:  Physical Exam  GENERAL:  57 y.o.-year-old patient lying in the bed with no acute distress.  EYES: Pupils equal, round, reactive to light and accommodation. No scleral icterus. Extraocular muscles intact.  HEENT: Head atraumatic, normocephalic. Oropharynx and nasopharynx clear.  NECK:  Supple, no jugular venous distention. No thyroid enlargement, no tenderness.  LUNGS: Normal breath sounds bilaterally, no wheezing, rales,rhonchi or crepitation. No use of accessory muscles of respiration.  CARDIOVASCULAR: S1, S2 normal. No murmurs, rubs, or gallops.  ABDOMEN: Soft, but tender in left upper quadrant and around the umbilicus, nondistended. Ileostomy bag in place with loose ark stool Bowel sounds present. No organomegaly or mass.  EXTREMITIES: No pedal edema, cyanosis, or clubbing.  NEUROLOGIC: Cranial nerves II through XII are intact. Muscle strength 5/5 in all extremities. Sensation intact. Gait not checked.  PSYCHIATRIC: The patient is alert and oriented x 3.  SKIN: No obvious rash, lesion, or ulcer.    LABORATORY PANEL:    CBC Recent Labs  Lab 06/18/17 0401  WBC 10.5  HGB 9.8*  HCT 28.1*  PLT 298   ------------------------------------------------------------------------------------------------------------------  Chemistries  Recent Labs  Lab 06/15/17 1746  06/17/17 0400  NA 133*   < > 139  K 4.6   < > 3.9  CL 99*   < > 106  CO2 22   < > 24  GLUCOSE 445*   < > 202*  BUN 27*   < > 9  CREATININE 0.92   < > 0.73  CALCIUM 8.6*   < > 8.3*  AST 32  --   --   ALT 16  --   --   ALKPHOS 85  --   --   BILITOT 0.2*  --   --    < > = values in this interval not displayed.   ------------------------------------------------------------------------------------------------------------------  Cardiac Enzymes No results for input(s): TROPONINI in the last 168 hours. ------------------------------------------------------------------------------------------------------------------  RADIOLOGY:  No results found.  EKG:   Orders placed or performed in visit on 12/16/14  . EKG 12-Lead  . EKG 12-Lead  . EKG 12-Lead    ASSESSMENT AND PLAN:   57 year old female with past medical history significant for ulcerative colitis status post total colectomy, ileostomy, Crohn's disease, arthritis, diabetes, GERD presents to hospital secondary to abdominal pain and melena  1. Melena- secondary to an oozing duodenal ulcer - s/p EGD yesterday and banding done - appreciate GI consult, likely crohns ulcer - continue protonix drip for 72 hrs. - continue NPO with ice chips and very slow advancement -Hemoglobin drop noted, patient is Fara Boros witness- so iron or epo for now- repeat hb later today -CT of the abdomen on admission showing nonobstructive bowel loop pattern. No other acute abnormality noted.  2. Crohn's disease-recently finished a prednisone taper. -On Bentyl, Creon, Levsin and pain medications. Might need change in meds as outpt for crohns management  3. Diabetes mellitus-sliding scale insulin while  nothing by mouth. Takes Levemir 45 units at bedtime.  Started at 12 units at bedtime  4. Chronic abdominal pain-continue pain medications  5. DVT prophylaxis-Ted's and SCDs   Patient is ambulatory   All the records are reviewed and case discussed with Care Management/Social Workerr. Management plans discussed with the patient, family and they are in agreement.  CODE STATUS: Full code  TOTAL TIME TAKING CARE OF THIS PATIENT: 33 minutes.   POSSIBLE D/C IN 1-2 DAYS, DEPENDING ON CLINICAL CONDITION.   Gladstone Lighter M.D on 06/18/2017 at 1:09 PM  Between 7am to 6pm - Pager - [phone]  After 6pm go to [email or web address] - password [email or web address] [facility]  Office  [phone]  CC: Primary care physician; Sharyne Peach, MD

## 2017-06-18 NOTE — Consult Note (Signed)
Name: Caroline Hughes MRN: [id number] DOB: 09-16-1960    ADMISSION DATE:  06/15/2017 CONSULTATION DATE: 06/18/2017  REFERRING MD : Dr. Tressia Miners   CHIEF COMPLAINT: Melena   BRIEF PATIENT DESCRIPTION:  57 yo female with hx of Crohns Disease s/p colectomy and ileostomy on Cimzia admitted with active GI bleeding unable to administer blood products pt is Jehovah Witness   SIGNIFICANT EVENTS/ STUDIES:  02/25-Pt admitted to [facility] unit with melena. CT Abd Pelvis revealed right lower quadrant ileostomy with adjacent herniated nonobstructive bowel loop unchanged compared to prior exam. There is no evidence of bowel obstruction. No acute abnormality is identified in the abdomen and pelvis. 02/27-Upper Endoscopy findings: the esophagus was normal. Hematin (alteredblood/coffee -ground-like material) was found in the gastric body. Lavage of the area was performed using a moderate amount, resulting in clearance with adequate visualization. Red blood was found in the entire duodenum. Lavage of the area was performed using a large amount, resulting in clearance with fair visualization. The duodenal bulb and second portion Area was injected with 5 mL of a 1:10,000 solution of epinephrine for hemostasis. 02/28-Pt transferred to stepdown unit due to active GI Bleed Vascular Surgery consulted. GI Bleeding scan negative   HISTORY OF PRESENT ILLNESS:   This is a 57 yo female with a PMH as listed below who presented to [facility] ER 02/25 by Dr. Dillard [place] office (GI physician) with worsening abdominal pain and melena onset of symptoms 5 days prior to presentation to ER.  She does have a hx of Crohns Disease s/p colectomy and ileostomy on Cimzia.  In the ER H&H initially stable, however due to concern of possible  decline in pt status she was admitted to [facility] unit by hospitalist team for further workup and treatment.  On 02/28 she required transfer to stepdown unit due to active GI bleed, hgb decreasing to 6.9, and  syncopal episode attempting to ambulate to restroom she did not hit her head during fall.  However, unable to transfuse blood products pt is Jehovah Witness.  Sequence of events during hospitalization listed above.    PAST MEDICAL HISTORY :   has a past medical history of Anemia, Anxiety, Arthritis, Asthma, Bronchitis (9233), Complication of anesthesia, Crohn's disease ([place]), Depression, Diabetes mellitus without complication ([place]), Endometriosis, Fibromyalgia, Gastric ulcer, GERD (gastroesophageal reflux disease), Herniated disc, cervical, Hyperlipemia, Intestinal obstruction ([place]), Migraine, Nausea, Neuropathy, Pain, Peristomal hernia, PONV (postoperative nausea and vomiting), and Ulcerative colitis ([place]).  has a past surgical history that includes Abdominal hysterectomy; rectal fistula packing; Ileostomy (Bilateral); Hernia repair; Cholecystectomy; Cataract extraction w/PHACO (Right, 11/25/2016); Colon resection; Cataract extraction w/PHACO (Left, 12/02/2016); Esophagogastroduodenoscopy (egd) with propofol (N/A, 06/17/2017); and Ileoscopy (N/A, 06/17/2017). Prior to Admission medications   Medication Sig Start Date End Date Taking? Authorizing Provider  albuterol (PROVENTIL HFA;VENTOLIN HFA) 108 (90 BASE) MCG/ACT inhaler Inhale 2 puffs into the lungs every 6 (six) hours as needed for wheezing or shortness of breath.   Yes [provider]  albuterol (PROVENTIL) (2.5 MG/3ML) 0.083% nebulizer solution Inhale 3 mLs into the lungs every 6 (six) hours as needed for shortness of breath. 10/29/16  Yes [provider]  baclofen (LIORESAL) 10 MG tablet Take 10 mg by mouth 2 (two) times daily.  02/13/16  Yes [provider]  Certolizumab Pegol (CIMZIA) 2 X 200 MG KIT Inject 400 mg into the skin every 30 (thirty) days. Once a month on the 13th   Yes [provider]  cyanocobalamin (,VITAMIN B-12,) 1000 MCG/ML injection Inject 1,000  mcg into the muscle every 30 (thirty) days.  09/11/15   Yes [provider]  dicyclomine (BENTYL) 10 MG capsule Take 10 mg by mouth every morning.  01/02/16  Yes [provider]  gabapentin (NEURONTIN) 800 MG tablet Take 800 mg by mouth 3 (three) times daily as needed (pain).    Yes [provider]  hydrOXYzine (ATARAX/VISTARIL) 25 MG tablet Take 25 mg by mouth every 8 (eight) hours as needed for anxiety.  05/04/12  Yes [provider]  hyoscyamine (LEVSIN, ANASPAZ) 0.125 MG tablet Take 0.125 mg by mouth every 4 (four) hours as needed for bladder spasms or cramping.    Yes [provider]  insulin detemir (LEVEMIR) 100 UNIT/ML injection Inject 45 Units into the skin at bedtime.   Yes [provider]  LORazepam (ATIVAN) 0.5 MG tablet Take 0.5 mg by mouth at bedtime as needed for anxiety or sleep.    Yes [provider]  metaxalone (SKELAXIN) 800 MG tablet Take 800 mg by mouth daily as needed for muscle spasms. 10/23/16  Yes [provider]  Milnacipran (SAVELLA) 50 MG TABS tablet Take 50 mg by mouth 2 (two) times daily.   Yes [provider]  montelukast (SINGULAIR) 10 MG tablet Take 10 mg by mouth at bedtime.  05/04/12  Yes [provider]  ondansetron (ZOFRAN-ODT) 8 MG disintegrating tablet Take 8 mg by mouth 2 (two) times daily as needed for nausea/vomiting. 10/23/16  Yes [provider]  Pancrelipase, Lip-Prot-Amyl, 25000 units CPEP Take 1 capsule by mouth daily.  12/28/14  Yes [provider]  ranitidine (ZANTAC) 150 MG capsule Take 150 mg by mouth daily as needed for heartburn.   Yes [provider]  sucralfate (CARAFATE) 1 GM/10ML suspension Take 1 g by mouth 4 (four) times daily -  with meals and at bedtime.   Yes [provider]  SUMAtriptan (IMITREX) 50 MG tablet Take 50 mg by mouth every 2 (two) hours as needed for migraine. May repeat in 2 hours if headache persists or recurs.   Yes [provider]  tiZANidine (ZANAFLEX)  4 MG tablet Take 4 mg by mouth daily as needed for muscle spasms.   Yes [provider]  traMADol (ULTRAM) 50 MG tablet Take 50 mg by mouth every 6 (six) hours as needed for moderate pain.    Yes [provider]  traZODone (DESYREL) 100 MG tablet Take 200 mg by mouth at bedtime.    Yes [provider]   Allergies  Allergen Reactions  . Naproxen Other (See Comments)    Other Reaction: GI bleed  . Penicillins Swelling, Rash and Other (See Comments)    Reaction: fever Has patient had a PCN reaction causing immediate rash, facial/tongue/throat swelling, SOB or lightheadedness with hypotension: Yes Has patient had a PCN reaction causing severe rash involving mucus membranes or skin necrosis: No Has patient had a PCN reaction that required hospitalization: No Has patient had a PCN reaction occurring within the last 10 years: No If all of the above answers are "NO", then may proceed with Cephalosporin use.   . Gluten Meal     Bloating, gas, pain  . Lactose Intolerance (Gi)     Bloating, gas, pain  . Latex Itching    redness  . Other Diarrhea, Nausea And Vomiting and Other (See Comments)    "DIGESTIVE ISSUES" Nuts  . Shellfish Allergy Other (See Comments)    Reaction: "I get stuffed up, not a true  allergy"  . Soy Allergy Other (See Comments)    Reaction: digestive issues.  . Sulfonylureas Other (See Comments)    Reaction: Pt isn't sure what medication this one was associated with or what the reaction was.  . Doxycycline Rash  . Eggs Or Egg-Derived Products Other (See Comments)    Nasal congestion  . Humira [Adalimumab] Rash and Other (See Comments)    Reaction: fever.  . Peanut-Containing Drug Products Rash  . Simvastatin Rash  . Statins Rash  . Sulfa Antibiotics Rash  . Vancomycin Rash and Other (See Comments)    Reaction: fever    FAMILY HISTORY:  family history includes Heart attack in her father; Hyperlipidemia in her father; Hypertension in her  father. SOCIAL HISTORY:  reports that she has quit smoking. she has never used smokeless tobacco. She reports that she drinks about 1.8 oz of alcohol per week. She reports that she does not use drugs.  REVIEW OF SYSTEMS: Positives in BOLD   Constitutional: fever, chills, weight loss, malaise/fatigue and diaphoresis.  HENT: Negative for hearing loss, ear pain, nosebleeds, congestion, sore throat, neck pain, tinnitus and ear discharge.   Eyes: Negative for blurred vision, double vision, photophobia, pain, discharge and redness.  Respiratory: Negative for cough, hemoptysis, sputum production, shortness of breath, wheezing and stridor.   Cardiovascular: Negative for chest pain, palpitations, orthopnea, claudication, leg swelling and PND.  Gastrointestinal: for heartburn, nausea, vomiting, abdominal pain, diarrhea, constipation, blood in stool and melena.  Genitourinary: Negative for dysuria, urgency, frequency, hematuria and flank pain.  Musculoskeletal: Negative for myalgias, back pain, joint pain and falls.  Skin: Negative for itching and rash.  Neurological: dizziness, tingling, tremors, sensory change, speech change, focal weakness, seizures, loss of consciousness, weakness and headaches.  Endo/Heme/Allergies: Negative for environmental allergies and polydipsia. Does not bruise/bleed easily.  SUBJECTIVE:  Currently c/o abdominal pain   VITAL SIGNS: Temp:  [97.7 F (36.5 C)-99 F (37.2 C)] 98.8 F (37.1 C) (02/28 1742) Pulse Rate:  [102-131] 125 (02/28 1742) Resp:  [16-20] 16 (02/28 1742) BP: (118-143)/(60-77) 143/77 (02/28 1742) SpO2:  [99 %-100 %] 100 % (02/28 1742)  PHYSICAL EXAMINATION: General: well developed, well nourished female, NAD Neuro: alert and oriented, follows commands  HEENT: supple, no JVD  Cardiovascular: sinus tach, s1s2, no M/R/G Lungs: clear throughout, even, non labored  Abdomen: +BS x4, soft, tenderness, non distended, ostomy intact  Musculoskeletal: normal  bulk and tone, no edema  Skin: pale no rashes or lesions   Recent Labs  Lab 06/15/17 1746 06/16/17 0353 06/17/17 0400  NA 133* 139 139  K 4.6 3.7 3.9  CL 99* 106 106  CO2 22 26 24   BUN 27* 15 9  CREATININE 0.92 0.62 0.73  GLUCOSE 445* 134* 202*   Recent Labs  Lab 06/16/17 0353 06/17/17 0400 06/18/17 0401 06/18/17 1618  HGB 11.7* 11.2* 9.8* 6.9*  HCT 34.1* 33.1* 28.1* 20.1*  WBC 8.9 5.7 10.5  --   PLT 301 293 298  --    Nm Gi Blood Loss  Result Date: 06/18/2017 CLINICAL DATA:  History of Crohn's disease and gastric ulcers. Bruising duodenal ulcer on EGD yesterday with banding done. Nausea and vomiting today. Ileostomy bag right-side with loose dark stool. EXAM: NUCLEAR MEDICINE GASTROINTESTINAL BLEEDING SCAN TECHNIQUE: Sequential abdominal images were obtained following intravenous administration of Tc-22mlabeled red blood cells. RADIOPHARMACEUTICALS:  26.1 mCi Tc-924mertechnetate in-vitro labeled red cells. COMPARISON:  CT 06/15/2017 FINDINGS: Exam demonstrates evidence of patient's IV tubing over the field-of-view  at various portions of the scan. There is no evidence of active GI bleed over 1 hour of imaging the abdomen and pelvis. IMPRESSION: Negative GI bleeding scan. Electronically Signed   By: Marin Olp M.D.   On: 06/18/2017 19:54    ASSESSMENT / PLAN: Acute GI Bleed Acute Blood Loss Anemia  Crohns Disease s/p Colectomy and Ileostomy Acute Pain  Hx: Diabetes Mellitus, Asthma, GERD, and Hyperlipidemia  P: Supplemental O2 for hypoxia and/or dyspnea  Prn bronchodilator therapy  Maintain map >65 with fluid resuscitation  NS @125  ml/hr  Continue protonix gtt  GI and Vascular surgery consulted appreciate input  SCD's for VTE prophylaxis, avoid chemical prophylaxis  Trend CBC Minimize blood draws Monitor for s/sx of bleeding  DO NOT ADMINISTER BLOOD PRODUCTS  Trend BMP Replace electrolytes as indicated  Monitor UOP  CBG's q4hrs  SSI  Prn morphine and  oxycodone for pain management   Marda Stalker, [place] Pager [phone] (please enter 7 digits) PCCM Consult Pager [phone] (please enter 7 digits)

## 2017-06-18 NOTE — Progress Notes (Signed)
[name] , MD [address] [place], [place], [place], [address] [address], [place], [place], [place], [address] Phone: [phone]  Fax: [phone]   [name] is being followed for gi bleed  Day 1 of follow up   Subjective: Some nausea, changed her ostomy bag in the morning at 8 am, and there was small qty of black stool.   Objective: Vital signs in last 24 hours: Vitals:   06/17/17 1609 06/17/17 1619 06/17/17 2105 06/18/17 0442  BP: (!) 145/80  140/70 129/71  Pulse: (!) 104 (!) 106 (!) 105 (!) 102  Resp: 13 17 20 20   Temp:   98.3 F (36.8 C) 97.7 F (36.5 C)  TempSrc:   Oral Oral  SpO2: 100% 100% 99% 99%  Weight:      Height:       Weight change:   Intake/Output Summary (Last 24 hours) at 06/18/2017 1029 Last data filed at 06/18/2017 8115 Gross per 24 hour  Intake 2801.75 ml  Output 1875 ml  Net 926.75 ml     Exam: Heart:: Regular rate and rhythm, S1S2 present or without murmur or extra heart sounds Lungs: normal, clear to auscultation and clear to auscultation and percussion Abdomen: soft, nontender, normal bowel sounds   Lab Results: @LABTEST2 @ Micro Results: No results found for this or any previous visit (from the past 240 hour(s)). Studies/Results: No results found. Medications: I have reviewed the patient's current medications. Scheduled Meds: . baclofen  10 mg Oral BID  . dicyclomine  10 mg Oral q morning - 10a  . feeding supplement  1 Container Oral TID BM  . insulin aspart  0-9 Units Subcutaneous Q6H  . insulin glargine  10 Units Subcutaneous QHS  . lipase/protease/amylase  24,000 Units Oral Daily  . Milnacipran  50 mg Oral BID  . montelukast  10 mg Oral QHS  . multivitamin with minerals  1 tablet Oral Daily  . traZODone  200 mg Oral QHS   Continuous Infusions: . sodium chloride 75 mL/hr at 06/18/17 0138  . pantoprozole (PROTONIX) infusion 8 mg/hr (06/18/17 0554)   PRN Meds:.acetaminophen **OR** acetaminophen,  albuterol, gabapentin, hydrOXYzine, hyoscyamine, LORazepam, morphine injection, ondansetron **OR** ondansetron (ZOFRAN) IV, oxyCODONE, promethazine   Assessment: Principal Problem:   Melena Active Problems:   Anxiety   Controlled type 2 diabetes mellitus with diabetic nephropathy, with long-term current use of insulin (HCC)   Crohn disease ([place])   GERD (gastroesophageal reflux disease)   Caroline Hughes is a 57 y.o. y/o female with h/o Crohns disease , S/p colectomy and ileostomy on Cimzia, follows with [facility] , brought in for melena, stable Hb and no evidence of bowel inflammation on CT scan of the abdomen. Prior history of duodenal ulcers. EGD 06/17/17 found actively bleeding ulcer in the duodenal bulb, treated with epinephrine and clipped, hemostasis acheived  Plan  1. The  duodenal ulcer, it may very well be related to the Crohns disease. Continue IV PPI for 72 hours and if stable then discharge. F/u with Dr Gustavo Lah in outpatient. Suggest repeat EGD and biopsies of the duodenum in 8 weeks and if ulcers still present may be due to duodenal crohns and then that would indicate a failure of Cimzia and stelara may be a better choice. Also think MR enterogram as an outpatient would help figure out if she has any further small bowel ulcers or inflammation   2. Monitor ostomy bag for further bowel movements- expect color to return to  normal . She is a Arts development officer witness and does not want blood transfusion . Can consider EPO shot/iron infusions.    LOS: 3 days   [name], MD 06/18/2017, 10:29 AM     Addendum : Caroline Hughes to Dr Tressia Miners in the evening , patient felt dizzy , no black stool in the bag, explained to check CBC and if dropped may need tagged RBC scan. I did see a bleeding ulcer yesterday but there may be other bleeding areas in the small bowel which I did not see and cannot reach with upper endoscope. Explained to give dose of EPO.

## 2017-06-18 NOTE — Progress Notes (Signed)
Around 4PM, patient went to bathroom, became lightheaded and passed out. Vitals stable except slightly tachycardic at that time to 110's.  Fluid bolus ordered and stat hemoglobin ordered. Patient had <20cc of dark blood mixed with few flecks of fresh blood blood then, Discussed with ICU attending- active GI bleed- will move to ICU GI bleeding scan ordered Vascular notified- they mentioned upper GI bleeds might be hard to stop the bleeds, especially if its a bleeding duodenal ulcer from crohns rather than vascular bleed.  Patient is critically ill. She is also a Jehovah's witness and does not want any blood transfusion. Ok for iron infusions   Critically ill, high risk for cardiorespiratory arrest. Total extra critical care time spent: 45 min

## 2017-06-18 NOTE — Progress Notes (Signed)
   06/18/17 1607  What Happened  Was fall witnessed? No  Was patient injured? Unsure  Patient found on floor  Found by Staff-comment (Pt yelled for help)  Stated prior activity bathroom-unassisted  Follow Up  MD notified Tressia Miners  Time MD notified 1600  Family notified No- patient refusal  Additional tests Yes-comment  Adult Fall Risk Assessment  Risk Factor Category (scoring not indicated) Fall has occurred during this admission (document High fall risk)  Patient's Fall Risk High Fall Risk (>13 points)  Adult Fall Risk Interventions  Required Bundle Interventions *See Row Information* High fall risk - low, moderate, and high requirements implemented  Additional Interventions Use of appropriate toileting equipment (bedpan, BSC, etc.)  Screening for Fall Injury Risk  Risk For Fall Injury- See Row Information  None identified  Vitals  BP 118/60  MAP (mmHg) 75  BP Location Left Arm  BP Method Automatic  Patient Position (if appropriate) Lying  Pulse Rate (!) 116  Pulse Rate Source Monitor  Oxygen Therapy  SpO2 99 %  O2 Device Room Air  Pain Assessment  Pain Assessment 0-10  Pain Score 2  Pain Type Acute pain  Pain Location Shoulder  Pain Orientation Left  Pain Descriptors / Indicators Sore  Multiple Pain Sites Yes  2nd Pain Site  Pain Score 8  Pain Location Leg  Pain Orientation Left;Lower  Neurological  Neuro (WDL) WDL  Level of Consciousness Alert  Orientation Level Oriented X4  Neuro Additional Assessments No  Musculoskeletal  Musculoskeletal (WDL) X  Assistive Device None  Generalized Weakness Yes  Weight Bearing Restrictions No  Integumentary  Integumentary (WDL) X  Skin Integrity Ecchymosis  Ecchymosis Location Leg  Ecchymosis Location Orientation Left  Ecchymosis Intervention Other (Comment) (ascessed)  Pain Assessment  Date Pain First Started 06/18/17  Prior to falling, patient was scored as a moderate fall risk. She was independently walking in  the hallway and ambulating to the bathroom. Pt began to complain of increased nausea. Administered 56m of Zofran IV. Approx. 10 minutes later, patient began vomiting dark green emesis. Notified Dr KTressia Miners Ordered to give 25 mg Phenergan IV. Checked on pt about an hour later and she c/o anxiety. Pt stated she takes Hydroxyzine at home.  Administered Hydroxyzine 214m Checked on pt 20 minutes later. She was resting. I went to lunch around 1535.  Upon returning to the floor, I was notified that patient was in the floor yelling for help. Entered the room and asked patient what happened. She was alert and oriented. Stated that she was trying to get to the bathroom when she began to feel dizzy and fell, hitting her shoulder and leg. We tried to stand her up and assist her to the chair but she began to faint. Eyes were fixed and she was unresponsive. Called Rapid Response and then a Code. Myself and others put the pt back into the bed. Once in the bed I began doing a sternal rub and speaking to the patient. She began responding. The tech got her vitals and Dr KaTressia Minersas called. Dr KaTressia Minersrdered stat Hemoglobin and Hematocrit, fluid bolus 50062mand transfer to stepdown unit.   Pt Hemoglobin 6.9. Transported to have blood loss study before going to ICU.

## 2017-06-19 ENCOUNTER — Encounter
Admission: EM | Disposition: A | Discharge status: Home Health | Payer: Self-pay | Source: Home / Self Care | Attending: Internal Medicine

## 2017-06-19 ENCOUNTER — Encounter: Payer: Self-pay | Admitting: Anesthesiology

## 2017-06-19 DIAGNOSIS — R000 Tachycardia, unspecified: Secondary | ICD-10-CM

## 2017-06-19 HISTORY — PX: ESOPHAGOGASTRODUODENOSCOPY (EGD) WITH PROPOFOL: SHX5813

## 2017-06-19 LAB — BASIC METABOLIC PANEL
Anion gap: 8 (ref 5–15)
BUN: 13 mg/dL (ref 6–20)
CALCIUM: 7.9 mg/dL — AB (ref 8.9–10.3)
CO2: 21 mmol/L — ABNORMAL LOW (ref 22–32)
CREATININE: 0.65 mg/dL (ref 0.44–1.00)
Chloride: 110 mmol/L (ref 101–111)
GFR calc Af Amer: >60 mL/min (ref >60)
GFR calc non Af Amer: >60 mL/min (ref >60)
GLUCOSE: 177 mg/dL — AB (ref 65–99)
POTASSIUM: 3.6 mmol/L (ref 3.5–5.1)
Sodium: 139 mmol/L (ref 135–145)

## 2017-06-19 LAB — IRON AND TIBC
Iron: 24 ug/dL — ABNORMAL LOW (ref 28–170)
SATURATION RATIOS: 8 % — AB (ref 10.4–31.8)
TIBC: 287 ug/dL (ref 250–450)
UIBC: 263 ug/dL

## 2017-06-19 LAB — CBC
HCT: 18.9 % — ABNORMAL LOW (ref 35.0–47.0)
Hemoglobin: 6.6 g/dL — ABNORMAL LOW (ref 12.0–16.0)
MCH: 33.9 pg (ref 26.0–34.0)
MCHC: 34.7 g/dL (ref 32.0–36.0)
MCV: 97.8 fL (ref 80.0–100.0)
PLATELETS: 290 10*3/uL (ref 150–440)
RBC: 1.93 MIL/uL — ABNORMAL LOW (ref 3.80–5.20)
RDW: 12.8 % (ref 11.5–14.5)
WBC: 8.4 10*3/uL (ref 3.6–11.0)

## 2017-06-19 LAB — FOLATE: FOLATE: 14.5 ng/mL (ref >5.9)

## 2017-06-19 LAB — GLUCOSE, CAPILLARY
Glucose-Capillary: 163 mg/dL — ABNORMAL HIGH (ref 65–99)
Glucose-Capillary: 164 mg/dL — ABNORMAL HIGH (ref 65–99)
Glucose-Capillary: 173 mg/dL — ABNORMAL HIGH (ref 65–99)
Glucose-Capillary: 215 mg/dL — ABNORMAL HIGH (ref 65–99)

## 2017-06-19 LAB — VITAMIN B12: Vitamin B-12: 691 pg/mL (ref 180–914)

## 2017-06-19 LAB — HEMOGLOBIN
HEMOGLOBIN: 6.1 g/dL — AB (ref 12.0–16.0)
Hemoglobin: 6.1 g/dL — ABNORMAL LOW (ref 12.0–16.0)

## 2017-06-19 LAB — FERRITIN: FERRITIN: 22 ng/mL (ref 11–307)

## 2017-06-19 SURGERY — ESOPHAGOGASTRODUODENOSCOPY (EGD) WITH PROPOFOL
Anesthesia: General

## 2017-06-19 MED ORDER — SODIUM CHLORIDE 0.9 % IV SOLN
200.0000 mg | INTRAVENOUS | Status: AC
  Administered 2017-06-19 – 2017-06-21 (×3): 200 mg via INTRAVENOUS
  Filled 2017-06-19 (×4): qty 10

## 2017-06-19 MED ORDER — FENTANYL CITRATE (PF) 100 MCG/2ML IJ SOLN
INTRAMUSCULAR | Status: DC | PRN
  Administered 2017-06-19 (×4): 25 ug via INTRAVENOUS

## 2017-06-19 MED ORDER — DARBEPOETIN ALFA 40 MCG/0.4ML IJ SOSY
40.0000 ug | PREFILLED_SYRINGE | INTRAMUSCULAR | Status: DC
Start: 2017-06-19 — End: 2017-06-26
  Administered 2017-06-19: 40 ug via SUBCUTANEOUS
  Filled 2017-06-19: qty 0.4

## 2017-06-19 MED ORDER — SODIUM CHLORIDE 0.9 % IV SOLN
0.0000 ug/min | INTRAVENOUS | Status: DC
  Filled 2017-06-19: qty 1

## 2017-06-19 MED ORDER — MIDAZOLAM HCL 2 MG/2ML IJ SOLN: 5.0000 mg | Freq: Once | INTRAMUSCULAR | Status: AC

## 2017-06-19 MED ORDER — MIDAZOLAM HCL 2 MG/2ML IJ SOLN
INTRAMUSCULAR | Status: DC | PRN
  Administered 2017-06-19: 1 mg via INTRAVENOUS
  Administered 2017-06-19: 0.5 mg via INTRAVENOUS
  Administered 2017-06-19 (×3): 1 mg via INTRAVENOUS

## 2017-06-19 MED ORDER — FENTANYL CITRATE (PF) 100 MCG/2ML IJ SOLN
INTRAMUSCULAR | Status: AC
  Filled 2017-06-19: qty 4

## 2017-06-19 MED ORDER — PROPOFOL 1000 MG/100ML IV EMUL
5.0000 ug/kg/min | INTRAVENOUS | Status: DC
  Administered 2017-06-19: 20 ug/kg/min via INTRAVENOUS

## 2017-06-19 MED ORDER — FENTANYL CITRATE (PF) 100 MCG/2ML IJ SOLN: 200.0000 ug | Freq: Once | INTRAMUSCULAR | Status: DC

## 2017-06-19 MED ORDER — PROPOFOL 1000 MG/100ML IV EMUL
INTRAVENOUS | Status: AC
Start: 2017-06-19 — End: 2017-06-19
  Administered 2017-06-19: 20 ug/kg/min via INTRAVENOUS
  Filled 2017-06-19: qty 100

## 2017-06-19 MED ORDER — INSULIN GLARGINE 100 UNIT/ML ~~LOC~~ SOLN
14.0000 [IU] | Freq: Every day | SUBCUTANEOUS | Status: DC
  Administered 2017-06-19: 14 [IU] via SUBCUTANEOUS
  Filled 2017-06-19: qty 0.14

## 2017-06-19 MED ORDER — METHYLPREDNISOLONE SODIUM SUCC 125 MG IJ SOLR
60.0000 mg | INTRAMUSCULAR | Status: DC
  Administered 2017-06-19 – 2017-06-22 (×4): 60 mg via INTRAVENOUS
  Filled 2017-06-19 (×4): qty 2

## 2017-06-19 MED ORDER — LACTATED RINGERS IV SOLN
INTRAVENOUS | Status: DC
  Administered 2017-06-19 – 2017-06-20 (×2): via INTRAVENOUS

## 2017-06-19 MED ORDER — MIDAZOLAM HCL 2 MG/2ML IJ SOLN
INTRAMUSCULAR | Status: AC
  Filled 2017-06-19: qty 6

## 2017-06-19 NOTE — Progress Notes (Signed)
Pt transferred to ICU from nuclear radiology. Pt rested comfortably overnight with maintenance fluid running. Pt continued to have melena overnight.

## 2017-06-19 NOTE — Progress Notes (Signed)
Pt stated that she would like her minister to be a part of her medical discussions. Patient stated she was a little unsure of what products were allowed to be given based on her faith and would like to discuss each option with her minister prior to making a decision.

## 2017-06-19 NOTE — Consult Note (Signed)
[facility] Consult Note  MRN : [id number]  Caroline Hughes is a 57 y.o. ([birth year]) female who presents with chief complaint of  Chief Complaint  Patient presents with  . Melena  .  History of Present Illness: I am asked to see the patient by Dr. Tressia Miners for evaluation of GI bleed.  The patient is Jehovah's Witness and cannot take blood and has become significantly anemic.  She had a upper endoscopy a couple of days ago with a clip of an ulcer in the duodenum.  She has had continued melena and actually had another EGD today which showed no blood in the stomach or duodenum.  The clip placed from 2 days ago was intact.  To try to identify the source of bleeding, a bleeding scan was performed yesterday which I have independently reviewed.  This does not demonstrate a source of active bleeding.  The patient is awake but weak.  She remains tachycardic in the ICU.  Current Facility-Administered Medications  Medication Dose Route Frequency Provider Last Rate Last Dose  . acetaminophen (TYLENOL) tablet 650 mg  650 mg Oral Q6H PRN Lance Coon, MD       Or  . acetaminophen (TYLENOL) suppository 650 mg  650 mg Rectal Q6H PRN Lance Coon, MD      . albuterol (PROVENTIL) (2.5 MG/3ML) 0.083% nebulizer solution 2.5 mg  2.5 mg Inhalation Q6H PRN Lance Coon, MD      . baclofen (LIORESAL) tablet 10 mg  10 mg Oral BID Lance Coon, MD   10 mg at 06/19/17 1000  . dicyclomine (BENTYL) capsule 10 mg  10 mg Oral q morning - 10a Lance Coon, MD   10 mg at 06/19/17 1100  . feeding supplement (BOOST / RESOURCE BREEZE) liquid 1 Container  1 Container Oral TID BM Gladstone Lighter, MD   1 Container at 06/17/17 2207  . gabapentin (NEURONTIN) capsule 800 mg  800 mg Oral TID PRN Lance Coon, MD      . hydrOXYzine (ATARAX/VISTARIL) tablet 25 mg  25 mg Oral Q8H PRN Lance Coon, MD   25 mg at 06/18/17 1503  . hyoscyamine (LEVSIN, ANASPAZ) tablet 0.125 mg  0.125 mg Oral  Q4H PRN Lance Coon, MD      . insulin aspart (novoLOG) injection 0-9 Units  0-9 Units Subcutaneous Q6H Lance Coon, MD   2 Units at 06/19/17 0536  . insulin glargine (LANTUS) injection 12 Units  12 Units Subcutaneous QHS Gladstone Lighter, MD   12 Units at 06/18/17 2133  . lactated ringers infusion   Intravenous Continuous Wilhelmina Mcardle, MD      . lipase/protease/amylase (CREON) capsule 24,000 Units  24,000 Units Oral Daily Lance Coon, MD   24,000 Units at 06/19/17 1000  . LORazepam (ATIVAN) tablet 0.5 mg  0.5 mg Oral QHS PRN Lance Coon, MD      . midazolam (VERSED) injection    PRN [name], MD   1 mg at 06/19/17 0932  . Milnacipran (SAVELLA) tablet TABS 50 mg  50 mg Oral BID Lance Coon, MD   50 mg at 06/19/17 1000  . montelukast (SINGULAIR) tablet 10 mg  10 mg Oral Corwin Levins, MD   10 mg at 06/18/17 2123  . morphine 2 MG/ML injection 2 mg  2 mg Intravenous Q4H PRN Lance Coon, MD   2 mg at 06/18/17 2042  . multivitamin with minerals tablet 1 tablet  1 tablet Oral Daily Gladstone Lighter, MD  1 tablet at 06/19/17 1000  . ondansetron (ZOFRAN) tablet 4 mg  4 mg Oral Q6H PRN Lance Coon, MD       Or  . ondansetron [facility]) injection 4 mg  4 mg Intravenous Q6H PRN Lance Coon, MD   4 mg at 06/18/17 0954  . oxyCODONE (Oxy IR/ROXICODONE) immediate release tablet 5 mg  5 mg Oral Q4H PRN Lance Coon, MD   5 mg at 06/19/17 0734  . promethazine (PHENERGAN) injection 25 mg  25 mg Intravenous Q6H PRN Gladstone Lighter, MD   25 mg at 06/18/17 2041  . propofol (DIPRIVAN) 1000 MG/100ML infusion           . traZODone (DESYREL) tablet 200 mg  200 mg Oral Corwin Levins, MD   200 mg at 06/18/17 2123    Past Medical History:  Diagnosis Date  . Anemia    Iron deficiency IRON INFUSIONS X 5 WEEKS  . Anxiety   . Arthritis   . Asthma   . Bronchitis 2017  . Complication of anesthesia   . Crohn's disease ([place])   . Depression   . Diabetes mellitus without complication  ([place])   . Endometriosis   . Fibromyalgia   . Gastric ulcer   . GERD (gastroesophageal reflux disease)   . Herniated disc, cervical   . Hyperlipemia   . Intestinal obstruction ([place])   . Migraine    CHRONIC  . Nausea    CHRONIC  . Neuropathy   . Pain    BACK  . Peristomal hernia   . PONV (postoperative nausea and vomiting)   . Ulcerative colitis [facility])     Past Surgical History:  Procedure Laterality Date  . ABDOMINAL HYSTERECTOMY    . CATARACT EXTRACTION W/PHACO Right 11/25/2016   Procedure: CATARACT EXTRACTION PHACO AND INTRAOCULAR LENS PLACEMENT (IOC);  Surgeon: Birder Robson, MD;  Location: [facility] ORS;  Service: Ophthalmology;  Laterality: Right;  [place] 00:40 AP% 17.4 CDE 6.94 Fluid pack lot # [id number]  . CATARACT EXTRACTION W/PHACO Left 12/02/2016   Procedure: CATARACT EXTRACTION PHACO AND INTRAOCULAR LENS PLACEMENT ([place]);  Surgeon: Birder Robson, MD;  Location: [facility] ORS;  Service: Ophthalmology;  Laterality: Left;  [place] 00:34 AP% 14.2 CDE 4.94 Flyuid pack lot 3 [id number]  . CHOLECYSTECTOMY    . COLON RESECTION    . ESOPHAGOGASTRODUODENOSCOPY (EGD) WITH PROPOFOL N/A 06/17/2017   Procedure: ESOPHAGOGASTRODUODENOSCOPY (EGD) WITH PROPOFOL;  Surgeon: [name], MD;  Location[facility] ENDOSCOPY;  Service: Gastroenterology;  Laterality: N/A;  . HERNIA REPAIR     MULTIPLE PERISTOMAL  . ILEOSCOPY N/A 06/17/2017   Procedure: ILEOSCOPY THROUGH STOMA;  Surgeon: [name], MD;  Location[facility] END[facility]COPY;  Service: Gastroenterology;  Laterality: N/A;  . ILEOSTOMY Bilateral    MOVED X 3  . rectal fistula packing     X 3    Social History Social History   Tobacco Use  . Smoking status: Former Research scientist (life sciences)  . Smokeless tobacco: Never Used  Substance Use Topics  . Alcohol use: Yes    Alcohol/week: 1.8 oz    Types: 3 Shots of liquor per week    Comment: occassional  . Drug use: No    Family History Family History  Problem Relation Age of Onset  . Hypertension Father   .  Hyperlipidemia Father   . Heart attack Father   no bleeding or clotting disorders, no anerysms  Allergies  Allergen Reactions  . Naproxen Other (See Comments)    Other Reaction: GI bleed  .  Penicillins Swelling, Rash and Other (See Comments)    Reaction: fever Has patient had a PCN reaction causing immediate rash, facial/tongue/throat swelling, SOB or lightheadedness with hypotension: Yes Has patient had a PCN reaction causing severe rash involving mucus membranes or skin necrosis: No Has patient had a PCN reaction that required hospitalization: No Has patient had a PCN reaction occurring within the last 10 years: No If all of the above answers are "NO", then may proceed with Cephalosporin use.   . Gluten Meal     Bloating, gas, pain  . Lactose Intolerance (Gi)     Bloating, gas, pain  . Latex Itching    redness  . Other Diarrhea, Nausea And Vomiting and Other (See Comments)    "DIGESTIVE ISSUES" Nuts  . Shellfish Allergy Other (See Comments)    Reaction: "I get stuffed up, not a true allergy"  . Soy Allergy Other (See Comments)    Reaction: digestive issues.  . Sulfonylureas Other (See Comments)    Reaction: Pt isn't sure what medication this one was associated with or what the reaction was.  . Doxycycline Rash  . Eggs Or Egg-Derived Products Other (See Comments)    Nasal congestion  . Humira [Adalimumab] Rash and Other (See Comments)    Reaction: fever.  . Peanut-Containing Drug Products Rash  . Simvastatin Rash  . Statins Rash  . Sulfa Antibiotics Rash  . Vancomycin Rash and Other (See Comments)    Reaction: fever     REVIEW OF SYSTEMS (Negative unless checked)  Constitutional: [] Weight loss  [] Fever  [] Chills Cardiac: [] Chest pain   [] Chest pressure   [] Palpitations   [] Shortness of breath when laying flat   [] Shortness of breath at rest   [] Shortness of breath with exertion. Vascular:  [] Pain in legs with walking   [] Pain in legs at rest   [] Pain in legs when  laying flat   [] Claudication   [] Pain in feet when walking  [] Pain in feet at rest  [] Pain in feet when laying flat   [] History of DVT   [] Phlebitis   [] Swelling in legs   [] Varicose veins   [] Non-healing ulcers Pulmonary:   [] Uses home oxygen   [] Productive cough   [] Hemoptysis   [] Wheeze  [] COPD   [] Asthma Neurologic:  [] Dizziness  [] Blackouts   [] Seizures   [] History of stroke   [] History of TIA  [] Aphasia   [] Temporary blindness   [] Dysphagia   [] Weakness or numbness in arms   [] Weakness or numbness in legs Musculoskeletal:  [] Arthritis   [] Joint swelling   [] Joint pain   [] Low back pain Hematologic:  [] Easy bruising  [] Easy bleeding   [] Hypercoagulable state   [x] Anemic  [] Hepatitis Gastrointestinal:  [x] Blood in stool   [] Vomiting blood  [x] Gastroesophageal reflux/heartburn   [] Difficulty swallowing. Genitourinary:  [] Chronic kidney disease   [] Difficult urination  [] Frequent urination  [] Burning with urination   [] Blood in urine Skin:  [] Rashes   [] Ulcers   [] Wounds Psychological:  [] History of anxiety   []  History of major depression.  Physical Examination  Vitals:   06/19/17 0945 06/19/17 0950 06/19/17 0955 06/19/17 1000  BP: 115/61 (!) 98/53 (!) 118/59 (!) 109/57  Pulse: (!) 113 (!) 113 (!) 116 (!) 110  Resp: 15 14 14 13   Temp:      TempSrc:      SpO2: 100% 100% 100% 100%  Weight:      Height:       Body mass index is 26.49 kg/m. Gen:  WD/WN, NAD. Pale Head: [place]/AT, No temporalis wasting. Ear/Nose/Throat: Hearing grossly intact, nares w/o erythema or drainage, oropharynx w/o Erythema/Exudate Eyes: Sclera non-icteric, conjunctiva clear Neck: Trachea midline.  No JVD.  Pulmonary:  Good air movement, respirations not labored, equal bilaterally.  Cardiac: tachycardic but sinus rhythm Vascular:  Vessel Right Left  Radial Palpable Palpable                                   Gastrointestinal: soft, non-tender/non-distended.  Musculoskeletal:  Extremities without ischemic  changes.  No deformity or atrophy. No edema. Neurologic:  Speech is fluent. Motor exam is non-focal. Psychiatric: Judgment intact, Mood & affect appropriate for pt's clinical situation. Dermatologic: No rashes or ulcers noted.  No cellulitis or open wounds.       CBC Lab Results  Component Value Date   WBC 8.4 06/19/2017   HGB 6.6 (L) 06/19/2017   HCT 18.9 (L) 06/19/2017   MCV 97.8 06/19/2017   PLT 290 06/19/2017    BMET    Component Value Date/Time   NA 139 06/19/2017 0530   NA 137 01/16/2014 1557   K 3.6 06/19/2017 0530   K 3.9 01/16/2014 1557   CL 110 06/19/2017 0530   CL 100 01/16/2014 1557   CO2 21 (L) 06/19/2017 0530   CO2 30 01/16/2014 1557   GLUCOSE 177 (H) 06/19/2017 0530   GLUCOSE 201 (H) 01/16/2014 1557   BUN 13 06/19/2017 0530   BUN 10 01/16/2014 1557   CREATININE 0.65 06/19/2017 0530   CREATININE 0.83 01/16/2014 1557   CALCIUM 7.9 (L) 06/19/2017 0530   CALCIUM 8.2 (L) 01/16/2014 1557   GFRNONAA >60 06/19/2017 0530   GFRNONAA >60 01/16/2014 1557   GFRNONAA >60 11/07/2013 0834   GFRAA >60 06/19/2017 0530   GFRAA >60 01/16/2014 1557   GFRAA >60 11/07/2013 0834   Estimated Creatinine Clearance: 68.1 mL/min (by C-G formula based on SCr of 0.65 mg/dL).  COAG Lab Results  Component Value Date   INR 0.81 06/15/2017   INR 0.8 07/09/2013    Radiology Nm Gi Blood Loss  Result Date: 06/18/2017 CLINICAL DATA:  History of Crohn's disease and gastric ulcers. Bruising duodenal ulcer on EGD yesterday with banding done. Nausea and vomiting today. Ileostomy bag right-side with loose dark stool. EXAM: NUCLEAR MEDICINE GASTROINTESTINAL BLEEDING SCAN TECHNIQUE: Sequential abdominal images were obtained following intravenous administration of Tc-1mlabeled red blood cells. RADIOPHARMACEUTICALS:  26.1 mCi Tc-971mertechnetate in-vitro labeled red cells. COMPARISON:  CT 06/15/2017 FINDINGS: Exam demonstrates evidence of patient's IV tubing over the field-of-view at  various portions of the scan. There is no evidence of active GI bleed over 1 hour of imaging the abdomen and pelvis. IMPRESSION: Negative GI bleeding scan. Electronically Signed   By: DaMarin Olp.D.   On: 06/18/2017 19:54   Ct Abdomen Pelvis W Contrast  Result Date: 06/15/2017 CLINICAL DATA:  Acute generalized abdomen pain EXAM: CT ABDOMEN AND PELVIS WITH CONTRAST TECHNIQUE: Multidetector CT imaging of the abdomen and pelvis was performed using the standard protocol following bolus administration of intravenous contrast. CONTRAST:  10061mSOVUE-300 IOPAMIDOL (ISOVUE-300) INJECTION 61% COMPARISON:  November 18, 2014 FINDINGS: Lower chest: No acute abnormality. Hepatobiliary: There is diffuse low density of the liver. No focal liver lesion is identified. The patient status post prior cholecystectomy. Post surgical intra and extrahepatic biliary ductal dilatation is identified. The common bowel duct measures 1.3 cm. Pancreas: Unremarkable. No pancreatic  ductal dilatation or surrounding inflammatory changes. Spleen: Normal in size without focal abnormality. Adrenals/Urinary Tract: The adrenal glands are normal. There is a small cyst in lower pole left kidney. Bilateral parapelvic renal cysts are identified. There is no hydronephrosis bilaterally. The bladder is normal. Stomach/Bowel: There is a periosteal mole hernia containing several on obstructed small bowel loops adjacent to the ileostomy unchanged compared to prior exam. There is no evidence of small bowel obstruction. Prior surgical changes of the bowel are stable. Vascular/Lymphatic: No significant vascular findings are present. No enlarged abdominal or pelvic lymph nodes. Reproductive: Status post hysterectomy. No adnexal masses. Other: None. Musculoskeletal: No acute or significant osseous findings. IMPRESSION: Right lower quadrant ileostomy with adjacent herniated nonobstructive bowel loop unchanged compared to prior exam. There is no evidence of bowel  obstruction. No acute abnormality is identified in the abdomen and pelvis. Electronically Signed   By: Abelardo Diesel M.D.   On: 06/15/2017 19:04      Assessment/Plan 1.  GI bleed.  Previous upper endoscopy treatment seems to have stopped the upper bleeding.  Concern is now present for a small bowel bleed.  Her nuclear medicine bleeding scan was normal with sign as such, there is really no role for embolization at this point without an identified source. 2.  Anemia.  Acute blood loss.  Complicated situation in a patient who cannot have blood transfusion.  Agree with optimal medical therapy 3.  Tachycardia.  Likely secondary to anemia and low volume status. 4.  Crohn's disease.  Likely the cause of #1.  Getting appropriate therapy for this.  Complicates the situation.   Leotis Pain, MD  06/19/2017 12:48 PM    This note was created with Dragon medical transcription system.  Any error is purely unintentional

## 2017-06-19 NOTE — Progress Notes (Signed)
Caroline Hughes , MD [address], [place], [place], [place], [address] [address], [place], [place], [place], [address] Phone: [phone]  Fax: [phone]   Caroline Hughes is being followed for GI bleed   Subjective: Still has melena, tagged scan was negative yesterday    Objective: Vital signs in last 24 hours: Vitals:   06/19/17 0500 06/19/17 0600 06/19/17 0700 06/19/17 0800  BP: 122/63 (!) 121/56 120/77 (!) 143/67  Pulse: (!) 107 (!) 107 (!) 118 (!) 113  Resp: 14 15 19 11   Temp:      TempSrc:      SpO2: 100% 98% 100% 100%  Weight:      Height:       Weight change: -2.5 oz (-0.525 kg)  Intake/Output Summary (Last 24 hours) at 06/19/2017 2563 Last data filed at 06/19/2017 0200 Gross per 24 hour  Intake 1954.75 ml  Output 1460 ml  Net 494.75 ml     Exam: Heart:: Regular rate and rhythm, S1S2 present or without murmur or extra heart sounds Lungs: normal, clear to auscultation and clear to auscultation and percussion Abdomen: soft, nontender, normal bowel sounds   Lab Results: @LABTEST2 @ Micro Results: Recent Results (from the past 240 hour(s))  MRSA PCR Screening     Status: None   Collection Time: 06/18/17  8:15 PM  Result Value Ref Range Status   MRSA by PCR NEGATIVE NEGATIVE Final    Comment:        The GeneXpert MRSA Assay (FDA approved for NASAL specimens only), is one component of a comprehensive MRSA colonization surveillance program. It is not intended to diagnose MRSA infection nor to guide or monitor treatment for MRSA infections. Performed at [facility], [address] [place], [place] [address]    Studies/Results: Nm Gi Blood Loss  Result Date: 06/18/2017 CLINICAL DATA:  History of Crohn's disease and gastric ulcers. Bruising duodenal ulcer on EGD yesterday with banding done. Nausea and vomiting today. Ileostomy bag right-side with loose dark stool. EXAM: NUCLEAR MEDICINE GASTROINTESTINAL BLEEDING SCAN TECHNIQUE:  Sequential abdominal images were obtained following intravenous administration of Tc-20mlabeled red blood cells. RADIOPHARMACEUTICALS:  26.1 mCi Tc-916mertechnetate in-vitro labeled red cells. COMPARISON:  CT 06/15/2017 FINDINGS: Exam demonstrates evidence of patient's IV tubing over the field-of-view at various portions of the scan. There is no evidence of active GI bleed over 1 hour of imaging the abdomen and pelvis. IMPRESSION: Negative GI bleeding scan. Electronically Signed   By: DaMarin Olp.D.   On: 06/18/2017 19:54   Medications: I have reviewed the patient's current medications. Scheduled Meds: . baclofen  10 mg Oral BID  . dicyclomine  10 mg Oral q morning - 10a  . feeding supplement  1 Container Oral TID BM  . insulin aspart  0-9 Units Subcutaneous Q6H  . insulin glargine  12 Units Subcutaneous QHS  . lipase/protease/amylase  24,000 Units Oral Daily  . Milnacipran  50 mg Oral BID  . montelukast  10 mg Oral QHS  . multivitamin with minerals  1 tablet Oral Daily  . traZODone  200 mg Oral QHS   Continuous Infusions: . 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 Prior history of duodenal ulcers.EGD 06/17/17 found actively bleeding ulcer in the duodenal bulb, treated with epinephrine and clipped, hemostasis achieved  but subsequently further drop noted gradually with recurrence of melena. Tagged RBC scan was negative. Hb 6.6 grams. Today   Plan  1. I discussed with the patient at 7.55 am that if she did not have a blood transfusion , if she continued to bleed , very likely will die. She refused any blood transfusion. Dr Tressia Miners in the room too during the discussion. Since we have limited options. Will repeat EGD today to see if still bleeding from the duodenum , she can be bleeding anwhere in the small bowel . She agreed with the plan . She is aware of the risk of death due to hypovolemia during the procedure.   2. Suggest EPO, tranexamic acid, dose of IV iron , IV PPI, IV octreotide and trial if IV solumedrol. IF the ulcers are from Crohns , the steroids may help to heal.    I have discussed alternative options, risks & benefits,  which include, but are not limited to, bleeding, infection, perforation,respiratory complication & drug reaction.  The patient agrees with this plan & written consent will be obtained.       LOS: 4 days   Caroline Bellows, MD 06/19/2017, 8:38 AM

## 2017-06-19 NOTE — Progress Notes (Signed)
[facility] [place] NAME: Caroline Hughes    MR#:  [id number]  DATE OF BIRTH:  [birth year]  SUBJECTIVE:  CHIEF COMPLAINT:   Chief Complaint  Patient presents with  . Melena   - Patient transferred to ICU yesterday after an 57 syncopal episode associated with hypotension and tachycardia. Repeat hemoglobin has dropped 3 points. 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 -Feels weak and dizzy still. Remains tachycardic  REVIEW OF SYSTEMS:  Review of Systems  Constitutional: Negative for chills, fever and malaise/fatigue.  HENT: Negative for congestion, ear discharge, hearing loss and nosebleeds.   Eyes: Negative for blurred vision, double vision and photophobia.  Respiratory: Negative for cough, shortness of breath and wheezing.   Cardiovascular: Negative for chest pain, palpitations and leg swelling.  Gastrointestinal: Positive for abdominal pain, melena and nausea. Negative for constipation, diarrhea and vomiting.  Genitourinary: Negative for dysuria.  Musculoskeletal: Negative for myalgias.  Neurological: Negative for dizziness, speech change, focal weakness, seizures and headaches.  Psychiatric/Behavioral: Negative for depression.    DRUG ALLERGIES:   Allergies  Allergen Reactions  . Naproxen Other (See Comments)    Other Reaction: GI bleed  . Penicillins Swelling, Rash and Other (See Comments)    Reaction: fever Has patient had a PCN reaction causing immediate rash, facial/tongue/throat swelling, SOB or lightheadedness with hypotension: Yes Has patient had a PCN reaction causing severe rash involving mucus membranes or skin necrosis: No Has patient had a PCN reaction that required hospitalization: No Has patient had a PCN reaction occurring within the last 10 years: No If all of the above answers are "NO", then may proceed with Cephalosporin use.   . Gluten Meal     Bloating, gas, pain  . Lactose  Intolerance (Gi)     Bloating, gas, pain  . Latex Itching    redness  . Other Diarrhea, Nausea And Vomiting and Other (See Comments)    "DIGESTIVE ISSUES" Nuts  . Shellfish Allergy Other (See Comments)    Reaction: "I get stuffed up, not a true allergy"  . Soy Allergy Other (See Comments)    Reaction: digestive issues.  . Sulfonylureas Other (See Comments)    Reaction: Pt isn't sure what medication this one was associated with or what the reaction was.  . Doxycycline Rash  . Eggs Or Egg-Derived Products Other (See Comments)    Nasal congestion  . Humira [Adalimumab] Rash and Other (See Comments)    Reaction: fever.  . Peanut-Containing Drug Products Rash  . Simvastatin Rash  . Statins Rash  . Sulfa Antibiotics Rash  . Vancomycin Rash and Other (See Comments)    Reaction: fever    VITALS:  Blood pressure (!) 109/57, pulse (!) 110, temperature 98.6 F (37 C), temperature source Axillary, resp. rate 13, height 5' 2"  (1.575 m), weight 65.7 kg (144 lb 13.5 oz), SpO2 100 %.  PHYSICAL EXAMINATION:  Physical Exam  GENERAL:  57 y.o.-year-old patient lying in the bed with no acute distress.  EYES: Pupils equal, round, reactive to light and accommodation. No scleral icterus. Extraocular muscles intact.  HEENT: Head atraumatic, normocephalic. Oropharynx and nasopharynx clear.  NECK:  Supple, no jugular venous distention. No thyroid enlargement, no tenderness.  LUNGS: Normal breath sounds bilaterally, no wheezing, rales,rhonchi or crepitation. No use of accessory muscles of respiration.  CARDIOVASCULAR: S1, S2 normal. No murmurs, rubs, or gallops.  ABDOMEN: Soft, but tender in left upper quadrant and around the umbilicus,  nondistended. Ileostomy bag in place with loose dark stool Bowel sounds present. No organomegaly or mass.  EXTREMITIES: No pedal edema, cyanosis, or clubbing.  NEUROLOGIC: Cranial nerves II through XII are intact. Muscle strength 5/5 in all extremities. Sensation  intact. Gait not checked. Global weakness noted. PSYCHIATRIC: The patient is alert and oriented x 3.  SKIN: No obvious rash, lesion, or ulcer.    LABORATORY PANEL:   CBC Recent Labs  Lab 06/19/17 0530  WBC 8.4  HGB 6.6*  HCT 18.9*  PLT 290   ------------------------------------------------------------------------------------------------------------------  Chemistries  Recent Labs  Lab 06/15/17 1746  06/19/17 0530  NA 133*   < > 139  K 4.6   < > 3.6  CL 99*   < > 110  CO2 22   < > 21*  GLUCOSE 445*   < > 177*  BUN 27*   < > 13  CREATININE 0.92   < > 0.65  CALCIUM 8.6*   < > 7.9*  AST 32  --   --   ALT 16  --   --   ALKPHOS 85  --   --   BILITOT 0.2*  --   --    < > = values in this interval not displayed.   ------------------------------------------------------------------------------------------------------------------  Cardiac Enzymes No results for input(s): TROPONINI in the last 168 hours. ------------------------------------------------------------------------------------------------------------------  RADIOLOGY:  Nm Gi Blood Loss  Result Date: 06/18/2017 CLINICAL DATA:  History of Crohn's disease and gastric ulcers. Bruising duodenal ulcer on EGD yesterday with banding done. Nausea and vomiting today. Ileostomy bag right-side with loose dark stool. EXAM: NUCLEAR MEDICINE GASTROINTESTINAL BLEEDING SCAN TECHNIQUE: Sequential abdominal images were obtained following intravenous administration of Tc-13mlabeled red blood cells. RADIOPHARMACEUTICALS:  26.1 mCi Tc-96mertechnetate in-vitro labeled red cells. COMPARISON:  CT 06/15/2017 FINDINGS: Exam demonstrates evidence of patient's IV tubing over the field-of-view at various portions of the scan. There is no evidence of active GI bleed over 1 hour of imaging the abdomen and pelvis. IMPRESSION: Negative GI bleeding scan. Electronically Signed   By: DaMarin Olp.D.   On: 06/18/2017 19:54    EKG:   Orders placed  or performed in visit on 12/16/14  . EKG 12-Lead  . EKG 12-Lead  . EKG 12-Lead    ASSESSMENT AND PLAN:   89ear old female with past medical history significant for ulcerative colitis status post total colectomy, ileostomy, Crohn's disease, arthritis, diabetes, GERD presents to hospital secondary to abdominal pain and melena  1. Acute anemia- secondary to GI bleed. -Patient had an oozing duodenal ulcer on EGD. However double-stranded, however have near syncopal episode with more active bleeding yesterday. -Repeat EGD showing that the duodenal ulcer bleeding has stopped. So she could have other bleeding ulcers from her Crohn's in her small intestines. -GI bleeding scan is negative. Appreciate vascular consult. Patient has been transferred to ICU -Continue nothing by mouth status for now -CT of the abdomen on admission showing nonobstructive bowel loop pattern. No other acute abnormality noted. -Patient is a Jehovah's Witness-refuses blood transfusion strictly. She is okay with the epogen, IV iron. If needed IV albumin is okay as well  2. Crohn's disease- likely active with bleeding ulcers -On Bentyl, Creon, Levsin and pain medications. -Will start IV steroids today as per GI recommendations   3. Diabetes mellitus-sliding scale insulin while nothing by mouth. also will add Lantus at bedtime for basal insulin. Appreciate diabetes coordinator input Her home dose is Levemir 45 units at bedtime.  4. Chronic abdominal pain-continue pain medications  5. DVT prophylaxis-Ted's and SCDs.  -patient is critically ill and high risk for hemorrhagic shock. Risks of not getting the transfusion has been explained.   the records are reviewed and case discussed with Care Management/Social Workerr. Management plans discussed with the patient, family and they are in agreement.  CODE STATUS: Full code  TOTAL CRITICAL CARE TIME SPENT IN TAKING CARE OF THIS PATIENT: 42 minutes.   POSSIBLE D/C IN ?  DAYS, DEPENDING ON CLINICAL CONDITION.   Gladstone Lighter M.D on 06/19/2017 at 1:52 PM  Between 7am to 6pm - Pager - [phone]  After 6pm go to [email or web address] - password [email or web address] [facility]  Office  [phone]  CC: Primary care physician; Sharyne Peach, MD

## 2017-06-19 NOTE — Progress Notes (Signed)
EGD showed no blood in stomach or duodenum , old clip placed 2 days back intact.   Likely source of bleed from the small bowel    Plan  Discussed with Dr Tressia Miners  1. EPO if family ok  2. Tranexamic acid 3. IV iron and a shot of vitamin B12 4. General surgery and vascular surgery consult  5. If she can tolerate bowel prep later today then can consider ileoscopy as far as we can go but may be hard to find source if having active melena 6. Start on solumedrol IV 60 mg once a day   I informed family that if she continues to bleed, she will die as she does not want any blood transfusion .    \Patient and family are aware  Dr [name] MD,MRCP [facility]) Gastroenterology/Hepatology Pager: [phone]

## 2017-06-19 NOTE — Progress Notes (Signed)
Patient is hemodynamically stable and cognitively intact.  She underwent repeat EGD today without active bleeding.  She has been started on systemic steroids for possible flare of Crohn's disease.  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  She will except albumin and erythropoietin.  Merton Border, MD PCCM service Mobile [phone] Pager [phone] 06/19/2017 5:33 PM

## 2017-06-19 NOTE — Op Note (Signed)
[facility] Gastroenterology Patient Name: Caroline Hughes Procedure Date: 06/19/2017 8:46 AM MRN: [id number] Account #: [id number] Date of Birth: 1960/06/14 Admit Type: Inpatient Age: 57 Room: CCU 18 Gender: Female Note Status: Finalized Procedure:            Upper GI endoscopy Indications:          Melena Providers:            [name] MD, MD Referring MD:         Rubbie Battiest. Iona Beard MD, MD (Referring MD) Medicines:            Monitored Anesthesia Care Complications:        No immediate complications. Procedure:            Pre-Anesthesia Assessment:                       - Prior to the procedure, a History and Physical was                        performed, and patient medications, allergies and                        sensitivities were reviewed. The patient's tolerance of                        previous anesthesia was reviewed.                       - ASA Grade Assessment: IV - A patient with severe                        systemic disease that is a constant threat to life.                       After obtaining informed consent, the endoscope was                        passed under direct vision. Throughout the procedure,                        the patient's blood pressure, pulse, and oxygen                        saturations were monitored continuously. The Endoscope                        was introduced through the mouth, and advanced to the                        third part of duodenum. The upper GI endoscopy was                        accomplished with ease. The patient tolerated the                        procedure well. Findings:      The esophagus was normal.      The stomach was normal.      The examined duodenum was normal. Endo clip placed day before intact, no       blood seen anywhere in  the stomach or duodenum .      The cardia and gastric fundus were normal on retroflexion. Impression:           - Normal esophagus.                       - Normal  stomach.                       - Normal examined duodenum.                       - No specimens collected. Recommendation:       - NPO.                       - 1. no source of active bleed per EGD. Likely a small                        bowel bleed                       2. Further options to treat would be via intervention                        radiology or surgery if she continues bleed. Procedure Code(s):    --- Professional ---                       [id number], Esophagogastroduodenoscopy, flexible, transoral;                        diagnostic, including collection of specimen(s) by                        brushing or washing, when performed (separate procedure) Diagnosis Code(s):    --- Professional ---                       K92.1, Melena (includes Hematochezia) CPT copyright 2016 American Medical Association. All rights reserved. The codes documented in this report are preliminary and upon coder review may  be revised to meet current compliance requirements. [name], MD [name] MD, MD 06/19/2017 9:55:36 AM This report has been signed electronically. Number of Addenda: 0 Note Initiated On: 06/19/2017 8:46 AM      [facility]

## 2017-06-19 NOTE — Progress Notes (Signed)
Inpatient Diabetes Program Recommendations  AACE/ADA: New Consensus Statement on Inpatient Glycemic Control (2015)  Target Ranges:  Prepandial:   less than 140 mg/dL      Peak postprandial:   less than 180 mg/dL (1-2 hours)      Critically ill patients:  140 - 180 mg/dL   Lab Results  Component Value Date   GLUCAP 164 (H) 06/19/2017   HGBA1C 8.0 (H) 11/07/2013    Review of Glycemic Control  Results for MURPHY, BUNDICK (MRN [id number]) as of 06/19/2017 12:43  Ref. Range 06/18/2017 16:01 06/18/2017 20:18 06/19/2017 00:20 06/19/2017 05:31 06/19/2017 12:10  Glucose-Capillary Latest Ref Range: 65 - 99 mg/dL 288 (H) 244 (H) 215 (H) 163 (H) 164 (H)    Diabetes history: DM2 Outpatient Diabetes medications: Levemir 45 units QHS  Current orders for Inpatient glycemic control: Novolog 0-9 units Q6H, Lantus 12 units qhs  Inpatient Diabetes Program Recommendations: Consider increasing Lantus to 14 (0.2units/kg)units qhs   Thanks,  Gentry Fitz, RN, [place], [place], CDE Diabetes Coordinator Inpatient Diabetes Program  [phone] (Team Pager) [phone] ([place]) 06/19/2017 12:46 PM

## 2017-06-19 NOTE — H&P (Signed)
[name], MD [address], [place], [place], [place], [address] [address], [place], [place], [place], [address] Phone: [phone]  Fax: [phone]  Primary Care Physician:  Sharyne Peach, MD   Pre-Procedure History & Physical: HPI:  Caroline Hughes is a 57 y.o. female is here for an endoscopy    Past Medical History:  Diagnosis Date  . Anemia    Iron deficiency IRON INFUSIONS X 5 WEEKS  . Anxiety   . Arthritis   . Asthma   . Bronchitis 2017  . Complication of anesthesia   . Crohn's disease ([place])   . Depression   . Diabetes mellitus without complication ([place])   . Endometriosis   . Fibromyalgia   . Gastric ulcer   . GERD (gastroesophageal reflux disease)   . Herniated disc, cervical   . Hyperlipemia   . Intestinal obstruction ([place])   . Migraine    CHRONIC  . Nausea    CHRONIC  . Neuropathy   . Pain    BACK  . Peristomal hernia   . PONV (postoperative nausea and vomiting)   . Ulcerative colitis [facility])     Past Surgical History:  Procedure Laterality Date  . ABDOMINAL HYSTERECTOMY    . CATARACT EXTRACTION W/PHACO Right 11/25/2016   Procedure: CATARACT EXTRACTION PHACO AND INTRAOCULAR LENS PLACEMENT (IOC);  Surgeon: Birder Robson, MD;  Location: [facility] ORS;  Service: Ophthalmology;  Laterality: Right;  [place] 00:40 AP% 17.4 CDE 6.94 Fluid pack lot # [id number]  . CATARACT EXTRACTION W/PHACO Left 12/02/2016   Procedure: CATARACT EXTRACTION PHACO AND INTRAOCULAR LENS PLACEMENT ([place]);  Surgeon: Birder Robson, MD;  Location: [facility] ORS;  Service: Ophthalmology;  Laterality: Left;  [place] 00:34 AP% 14.2 CDE 4.94 Flyuid pack lot 3 [id number]  . CHOLECYSTECTOMY    . COLON RESECTION    . ESOPHAGOGASTRODUODENOSCOPY (EGD) WITH PROPOFOL N/A 06/17/2017   Procedure: ESOPHAGOGASTRODUODENOSCOPY (EGD) WITH PROPOFOL;  Surgeon: [name], MD;  Location: [facility] ENDOSCOPY;  Service: Gastroenterology;  Laterality: N/A;  . HERNIA REPAIR     MULTIPLE PERISTOMAL  .  ILEOSCOPY N/A 06/17/2017   Procedure: ILEOSCOPY THROUGH STOMA;  Surgeon: [name], MD;  Location: [facility] ENDOSCOPY;  Service: Gastroenterology;  Laterality: N/A;  . ILEOSTOMY Bilateral    MOVED X 3  . rectal fistula packing     X 3    Prior to Admission medications   Medication Sig Start Date End Date Taking? Authorizing Provider  albuterol (PROVENTIL HFA;VENTOLIN HFA) 108 (90 BASE) MCG/ACT inhaler Inhale 2 puffs into the lungs every 6 (six) hours as needed for wheezing or shortness of breath.   Yes [provider]  albuterol (PROVENTIL) (2.5 MG/3ML) 0.083% nebulizer solution Inhale 3 mLs into the lungs every 6 (six) hours as needed for shortness of breath. 10/29/16  Yes [provider]  baclofen (LIORESAL) 10 MG tablet Take 10 mg by mouth 2 (two) times daily.  02/13/16  Yes [provider]  Certolizumab Pegol (CIMZIA) 2 X 200 MG KIT Inject 400 mg into the skin every 30 (thirty) days. Once a month on the 13th   Yes [provider]  cyanocobalamin (,VITAMIN B-12,) 1000 MCG/ML injection Inject 1,000 mcg into the muscle every 30 (thirty) days.  09/11/15  Yes [provider]  dicyclomine (BENTYL) 10 MG capsule Take 10 mg by mouth every morning.  01/02/16  Yes [provider]  gabapentin (NEURONTIN) 800 MG tablet Take 800 mg by mouth 3 (three) times daily as  needed (pain).    Yes [provider]  hydrOXYzine (ATARAX/VISTARIL) 25 MG tablet Take 25 mg by mouth every 8 (eight) hours as needed for anxiety.  05/04/12  Yes [provider]  hyoscyamine (LEVSIN, ANASPAZ) 0.125 MG tablet Take 0.125 mg by mouth every 4 (four) hours as needed for bladder spasms or cramping.    Yes [provider]  insulin detemir (LEVEMIR) 100 UNIT/ML injection Inject 45 Units into the skin at bedtime.   Yes [provider]  LORazepam (ATIVAN) 0.5 MG tablet Take 0.5 mg by mouth at bedtime as needed for anxiety or sleep.    Yes [provider]  metaxalone (SKELAXIN) 800 MG tablet Take 800 mg by mouth daily as needed for muscle spasms. 10/23/16  Yes [provider]  Milnacipran (SAVELLA) 50 MG TABS tablet Take 50 mg by mouth 2 (two) times daily.   Yes [provider]  montelukast (SINGULAIR) 10 MG tablet Take 10 mg by mouth at bedtime.  05/04/12  Yes [provider]  ondansetron (ZOFRAN-ODT) 8 MG disintegrating tablet Take 8 mg by mouth 2 (two) times daily as needed for nausea/vomiting. 10/23/16  Yes [provider]  Pancrelipase, Lip-Prot-Amyl, 25000 units CPEP Take 1 capsule by mouth daily.  12/28/14  Yes [provider]  ranitidine (ZANTAC) 150 MG capsule Take 150 mg by mouth daily as needed for heartburn.   Yes [provider]  sucralfate (CARAFATE) 1 GM/10ML suspension Take 1 g by mouth 4 (four) times daily -  with meals and at bedtime.   Yes [provider]  SUMAtriptan (IMITREX) 50 MG tablet Take 50 mg by mouth every 2 (two) hours as needed for migraine. May repeat in 2 hours if headache persists or recurs.   Yes [provider]  tiZANidine (ZANAFLEX) 4 MG tablet Take 4 mg by mouth daily as needed for muscle spasms.   Yes [provider]  traMADol (ULTRAM) 50 MG tablet Take 50 mg by mouth every 6 (six) hours as needed for moderate pain.    Yes [provider]  traZODone (DESYREL) 100 MG tablet Take 200 mg by mouth at bedtime.    Yes [provider]    Allergies as of 06/15/2017 - Review Complete 06/15/2017  Allergen Reaction Noted  . Naproxen Other (See Comments)   . Penicillins Swelling, Rash, and Other (See Comments) 08/11/2014  . Gluten meal  11/28/2016  . Lactose intolerance (gi)  11/28/2016  . Latex Itching 11/19/2016  . Other Diarrhea, Nausea And Vomiting, and Other (See Comments) 08/11/2014  . Shellfish allergy Other (See Comments) 08/11/2014  . Soy allergy Other (See Comments) 08/11/2014  . Sulfonylureas Other  (See Comments) 08/11/2014  . Doxycycline Rash 02/18/2016  . Eggs or egg-derived products Other (See Comments) 09/24/2012  . Humira [adalimumab] Rash and Other (See Comments) 11/18/2014  . Peanut-containing drug products Rash 02/18/2016  . Simvastatin Rash 08/17/2012  . Statins Rash 11/18/2014  . Sulfa antibiotics Rash 11/18/2014  . Vancomycin Rash and Other (See Comments) 08/11/2014    Family History  Problem Relation Age of Onset  . Hypertension Father   . Hyperlipidemia Father   . Heart attack Father     Social History   Socioeconomic History  . Marital status: Widowed    Spouse name: Not on file  . Number of children: Not on file  . Years of education: Not on file  . Highest education level: Not on file  Social Needs  . Financial  resource strain: Not on file  . Food insecurity - worry: Not on file  . Food insecurity - inability: Not on file  . Transportation needs - medical: Not on file  . Transportation needs - non-medical: Not on file  Occupational History  . Not on file  Tobacco Use  . Smoking status: Former Research scientist (life sciences)  . Smokeless tobacco: Never Used  Substance and Sexual Activity  . Alcohol use: Yes    Alcohol/week: 1.8 oz    Types: 3 Shots of liquor per week    Comment: occassional  . Drug use: No  . Sexual activity: Not on file  Other Topics Concern  . Not on file  Social History Narrative  . Not on file    Review of Systems: See HPI, otherwise negative ROS  Physical Exam: BP (!) 143/67   Pulse (!) 113   Temp 98.6 F (37 C) (Axillary)   Resp 11   Ht 5' 2"  (1.575 m)   Wt 144 lb 13.5 oz (65.7 kg)   SpO2 100%   BMI 26.49 kg/m  General:   Alert,  pleasant and cooperative in NAD Head:  Normocephalic and atraumatic. Neck:  Supple; no masses or thyromegaly. Lungs:  Clear throughout to auscultation, normal respiratory effort.    Heart:  +S1, +S2, Regular rate and rhythm, No edema. Abdomen:  Soft, nontender and nondistended. Normal bowel sounds,  without guarding, and without rebound.   Neurologic:  Alert and  oriented x4;  grossly normal neurologically.  Impression/Plan: Caroline Hughes is here for an endoscopy  to be performed for  evaluation of GI bleed    Risks, benefits, limitations, and alternatives regarding endoscopy have been reviewed with the patient.  Questions have been answered.  All parties agreeable.   [name], MD  06/19/2017, 9:07 AM

## 2017-06-19 NOTE — Progress Notes (Signed)
Pt continues to complain of nausea and abdominal pain. Received 64m of phenergan and 560mof oxycodone. Pt appears more lethargic. VSS. MD notified.

## 2017-06-19 NOTE — Anesthesia Preprocedure Evaluation (Deleted)
Anesthesia Evaluation  Patient identified by MRN, date of birth, ID band Patient awake    Reviewed: Allergy & Precautions, H&P , NPO status , Patient's Chart, lab work & pertinent test results, reviewed documented beta blocker date and time   History of Anesthesia Complications (+) PONV and history of anesthetic complications  Airway Mallampati: II   Neck ROM: full    Dental  (+) Poor Dentition   Pulmonary neg pulmonary ROS, neg shortness of breath, asthma , former smoker,    Pulmonary exam normal        Cardiovascular Exercise Tolerance: Poor negative cardio ROS Normal cardiovascular exam Rhythm:regular Rate:Normal     Neuro/Psych  Headaches, PSYCHIATRIC DISORDERS Anxiety Depression  Neuromuscular disease negative neurological ROS  negative psych ROS   GI/Hepatic negative GI ROS, Neg liver ROS, PUD, GERD  ,  Endo/Other  negative endocrine ROSdiabetes  Renal/GU Renal diseasenegative Renal ROS  negative genitourinary   Musculoskeletal   Abdominal   Peds  Hematology negative hematology ROS (+) anemia ,   Anesthesia Other Findings Past Medical History: No date: Anemia     Comment:  Iron deficiency IRON INFUSIONS X 5 WEEKS No date: Anxiety No date: Arthritis No date: Asthma 2017: Bronchitis No date: Complication of anesthesia No date: Crohn's disease ([place]) No date: Depression No date: Diabetes mellitus without complication (HCC) No date: Endometriosis No date: Fibromyalgia No date: Gastric ulcer No date: GERD (gastroesophageal reflux disease) No date: Herniated disc, cervical No date: Hyperlipemia No date: Intestinal obstruction (HCC) No date: Migraine     Comment:  CHRONIC No date: Nausea     Comment:  CHRONIC No date: Neuropathy No date: Pain     Comment:  BACK No date: Peristomal hernia No date: PONV (postoperative nausea and vomiting) No date: Ulcerative colitis ([place]) Past Surgical History: No  date: ABDOMINAL HYSTERECTOMY 11/25/2016: CATARACT EXTRACTION W/PHACO; Right     Comment:  Procedure: CATARACT EXTRACTION PHACO AND INTRAOCULAR               LENS PLACEMENT ([place]);  Surgeon: Birder Robson, MD;                Location: [facility];  Service: Ophthalmology;  Laterality:              Right;  [place] 00:40 AP% 17.4 CDE 6.94 Fluid pack lot #               [id number] 12/02/2016: CATARACT EXTRACTION W/PHACO; Left     Comment:  Procedure: CATARACT EXTRACTION PHACO AND INTRAOCULAR               LENS PLACEMENT ([facility]);  Surgeon: Birder Robson, MD;                Location: [facility];  Service: Ophthalmology;  Laterality:              Left;  [place] 00:34 AP% 14.2 CDE 4.94 Flyuid pack lot 3               [id number] No date: CHOLECYSTECTOMY No date: COLON RESECTION 06/17/2017: ESOPHAGOGASTRODUODENOSCOPY (EGD) WITH PROPOFOL; N/A     Comment:  Procedure: ESOPHAGOGASTRODUODENOSCOPY (EGD) WITH               PROPOFOL;  Surgeon: [name], MD;  Location: [place];  Service: Gastroenterology;  Laterality: N/A; No date: HERNIA REPAIR     Comment:  MULTIPLE PERISTOMAL  06/17/2017: ILEOSCOPY; N/A     Comment:  Procedure: ILEOSCOPY THROUGH STOMA;  Surgeon: [name], MD;  Location: [facility] ENDOSCOPY;  Service:               Gastroenterology;  Laterality: N/A; No date: ILEOSTOMY; Bilateral     Comment:  MOVED X 3 No date: rectal fistula packing     Comment:  X 3 BMI    Body Mass Index:  26.49 kg/m     Reproductive/Obstetrics negative OB ROS                             Anesthesia Physical Anesthesia Plan  ASA: III and emergent  Anesthesia Plan: General   Post-op Pain Management:    Induction:   PONV Risk Score and Plan:   Airway Management Planned:   Additional Equipment:   Intra-op Plan:   Post-operative Plan:   Informed Consent: I have reviewed the patients History and Physical, chart, labs and discussed the procedure  including the risks, benefits and alternatives for the proposed anesthesia with the patient or authorized representative who has indicated his/her understanding and acceptance.   Dental Advisory Given  Plan Discussed with: CRNA  Anesthesia Plan Comments: ( Anemia noted.  Pt refusing blood products.  Jehovahs witness.  RB reviewed and accepted. JA)        Anesthesia Quick Evaluation

## 2017-06-20 ENCOUNTER — Encounter: Payer: Self-pay | Admitting: Gastroenterology

## 2017-06-20 DIAGNOSIS — K50018 Crohn's disease of small intestine with other complication: Secondary | ICD-10-CM

## 2017-06-20 LAB — BASIC METABOLIC PANEL
Anion gap: 11 (ref 5–15)
BUN: 10 mg/dL (ref 6–20)
CALCIUM: 8.2 mg/dL — AB (ref 8.9–10.3)
CO2: 23 mmol/L (ref 22–32)
CREATININE: 0.52 mg/dL (ref 0.44–1.00)
Chloride: 105 mmol/L (ref 101–111)
Glucose, Bld: 127 mg/dL — ABNORMAL HIGH (ref 65–99)
Potassium: 3.4 mmol/L — ABNORMAL LOW (ref 3.5–5.1)
SODIUM: 139 mmol/L (ref 135–145)

## 2017-06-20 LAB — CBC
HCT: 17.5 % — ABNORMAL LOW (ref 35.0–47.0)
Hemoglobin: 5.8 g/dL — ABNORMAL LOW (ref 12.0–16.0)
MCH: 33 pg (ref 26.0–34.0)
MCHC: 33.4 g/dL (ref 32.0–36.0)
MCV: 98.7 fL (ref 80.0–100.0)
PLATELETS: 292 10*3/uL (ref 150–440)
RBC: 1.77 MIL/uL — ABNORMAL LOW (ref 3.80–5.20)
RDW: 13.5 % (ref 11.5–14.5)
WBC: 8.7 10*3/uL (ref 3.6–11.0)

## 2017-06-20 LAB — GLUCOSE, CAPILLARY
GLUCOSE-CAPILLARY: 295 mg/dL — AB (ref 65–99)
Glucose-Capillary: 108 mg/dL — ABNORMAL HIGH (ref 65–99)
Glucose-Capillary: 181 mg/dL — ABNORMAL HIGH (ref 65–99)
Glucose-Capillary: 222 mg/dL — ABNORMAL HIGH (ref 65–99)
Glucose-Capillary: 99 mg/dL (ref 65–99)

## 2017-06-20 LAB — H. PYLORI ANTIGEN, STOOL: H. Pylori Stool Ag, Eia: NEGATIVE

## 2017-06-20 LAB — HEMOGLOBIN AND HEMATOCRIT, BLOOD
HEMATOCRIT: 16.9 % — AB (ref 35.0–47.0)
Hemoglobin: 6.1 g/dL — ABNORMAL LOW (ref 12.0–16.0)

## 2017-06-20 MED ORDER — INSULIN ASPART 100 UNIT/ML ~~LOC~~ SOLN
0.0000 [IU] | Freq: Every day | SUBCUTANEOUS | Status: DC
  Administered 2017-06-20 – 2017-06-21 (×2): 3 [IU] via SUBCUTANEOUS
  Administered 2017-06-22: 2 [IU] via SUBCUTANEOUS
  Administered 2017-06-23: 3 [IU] via SUBCUTANEOUS
  Administered 2017-06-25: 2 [IU] via SUBCUTANEOUS
  Filled 2017-06-20 (×4): qty 1

## 2017-06-20 MED ORDER — INSULIN DETEMIR 100 UNIT/ML ~~LOC~~ SOLN: 20.0000 [IU] | Freq: Every day | SUBCUTANEOUS | Status: DC

## 2017-06-20 MED ORDER — INSULIN DETEMIR 100 UNIT/ML ~~LOC~~ SOLN
14.0000 [IU] | Freq: Every day | SUBCUTANEOUS | Status: DC
  Administered 2017-06-20 – 2017-06-23 (×4): 14 [IU] via SUBCUTANEOUS
  Filled 2017-06-20 (×5): qty 0.14

## 2017-06-20 MED ORDER — INSULIN ASPART 100 UNIT/ML ~~LOC~~ SOLN
0.0000 [IU] | Freq: Three times a day (TID) | SUBCUTANEOUS | Status: DC
  Administered 2017-06-20: 3 [IU] via SUBCUTANEOUS
  Administered 2017-06-21: 9 [IU] via SUBCUTANEOUS
  Administered 2017-06-21 (×2): 2 [IU] via SUBCUTANEOUS
  Administered 2017-06-22 – 2017-06-24 (×2): 5 [IU] via SUBCUTANEOUS
  Administered 2017-06-24: 3 [IU] via SUBCUTANEOUS
  Administered 2017-06-25 (×2): 5 [IU] via SUBCUTANEOUS
  Administered 2017-06-26: 1 [IU] via SUBCUTANEOUS
  Filled 2017-06-20 (×11): qty 1

## 2017-06-20 MED ORDER — POTASSIUM CHLORIDE 10 MEQ/100ML IV SOLN
10.0000 meq | INTRAVENOUS | Status: DC
  Administered 2017-06-20 (×2): 10 meq via INTRAVENOUS
  Filled 2017-06-20 (×4): qty 100

## 2017-06-20 NOTE — Progress Notes (Signed)
Caroline Lame, MD [facility]   [address], [place], [place] [address] Phone: [phone] Fax : [phone]   Subjective: The patient has had no further sign of any GI bleeding but continues to drop her hemoglobin.  The patient has been given in IV fluids and medications to help increase her blood volume.  The patient denies any blood in her ostomy.   Objective: Vital signs in last 24 hours: Vitals:   06/20/17 0758 06/20/17 1000 06/20/17 1200 06/20/17 1522  BP: 131/63 127/66 (!) 143/69 138/68  Pulse:  (!) 103 (!) 109 (!) 102  Resp:  15 20 20   Temp: 97.9 F (36.6 C)  98.3 F (36.8 C) 98 F (36.7 C)  TempSrc:    Oral  SpO2:  100% 97% 100%  Weight:      Height:       Weight change:   Intake/Output Summary (Last 24 hours) at 06/20/2017 1805 Last data filed at 06/20/2017 1648 Gross per 24 hour  Intake 1822.5 ml  Output 900 ml  Net 922.5 ml     Exam: Heart:: Regular rate and rhythm, S1S2 present or without murmur or extra heart sounds Lungs: normal and clear to auscultation and percussion Abdomen: soft, nontender, normal bowel sounds Patient with ileostomy   Lab Results: @LABTEST2 @ Micro Results: Recent Results (from the past 240 hour(s))  MRSA PCR Screening     Status: None   Collection Time: 06/18/17  8:15 PM  Result Value Ref Range Status   MRSA by PCR NEGATIVE NEGATIVE Final    Comment:        The GeneXpert MRSA Assay (FDA approved for NASAL specimens only), is one component of a comprehensive MRSA colonization surveillance program. It is not intended to diagnose MRSA infection nor to guide or monitor treatment for MRSA infections. Performed at [facility], [address] [place], [place] [address]    Studies/Results: Nm Gi Blood Loss  Result Date: 06/18/2017 CLINICAL DATA:  History of Crohn's disease and gastric ulcers. Bruising duodenal ulcer on EGD yesterday with banding done. Nausea and vomiting today. Ileostomy bag right-side with  loose dark stool. EXAM: NUCLEAR MEDICINE GASTROINTESTINAL BLEEDING SCAN TECHNIQUE: Sequential abdominal images were obtained following intravenous administration of Tc-38mlabeled red blood cells. RADIOPHARMACEUTICALS:  26.1 mCi Tc-967mertechnetate in-vitro labeled red cells. COMPARISON:  CT 06/15/2017 FINDINGS: Exam demonstrates evidence of patient's IV tubing over the field-of-view at various portions of the scan. There is no evidence of active GI bleed over 1 hour of imaging the abdomen and pelvis. IMPRESSION: Negative GI bleeding scan. Electronically Signed   By: DaMarin Olp.D.   On: 06/18/2017 19:54   Medications: I have reviewed the patient's current medications. Scheduled Meds: . baclofen  10 mg Oral BID  . darbepoetin (ARANESP) injection - NON-DIALYSIS  40 mcg Subcutaneous Q Fri-1800  . dicyclomine  10 mg Oral q morning - 10a  . feeding supplement  1 Container Oral TID BM  . insulin aspart  0-5 Units Subcutaneous QHS  . insulin aspart  0-9 Units Subcutaneous TID WC  . insulin detemir  14 Units Subcutaneous QHS  . lipase/protease/amylase  24,000 Units Oral Daily  . methylPREDNISolone (SOLU-MEDROL) injection  60 mg Intravenous Q24H  . Milnacipran  50 mg Oral BID  . montelukast  10 mg Oral QHS  . multivitamin with minerals  1 tablet Oral Daily  . traZODone  200 mg Oral QHS   Continuous Infusions: . iron sucrose Stopped (06/20/17 1748)  PRN Meds:.acetaminophen **OR** [DISCONTINUED] acetaminophen, albuterol, gabapentin, hydrOXYzine, hyoscyamine, LORazepam, morphine injection, [DISCONTINUED] ondansetron **OR** ondansetron (ZOFRAN) IV, oxyCODONE, promethazine   Assessment: Principal Problem:   Melena Active Problems:   Anxiety   Controlled type 2 diabetes mellitus with diabetic nephropathy, with long-term current use of insulin (HCC)   Crohn disease (HCC)   GERD (gastroesophageal reflux disease)    Plan: This patient comes in with a history of Crohn's disease and anemia.   The patient had an upper endoscopy with treatment of a bleeding source.  The patient continues to drop her hemoglobin without any overt signs of bleeding which may be related to a slow bleed versus dilutional since she is receiving IV fluids.  The patient has been again explained the risks of anemia including stroke and heart attack and even death.  The patient continues to states she does not want any blood transfusions.  I will continue to monitor the patient with you and believe that any endoscopic procedure at this time with her hemoglobin being so low is High risk.   LOS: 5 days   Caroline [place] 06/20/2017, 6:05 PM

## 2017-06-20 NOTE — Progress Notes (Signed)
[facility] [place] NAME: [place]    MR#:  [id number]  DATE OF BIRTH:  09-Apr-1961  SUBJECTIVE:  CHIEF COMPLAINT:   Chief Complaint  Patient presents with  . Melena   - EGD yesterday showed stable duodenal ulcer no active bleeding. -Still has some melena, hemoglobin at 5.8. -Patient is a Jehovah's Witness-refused blood transfusion  REVIEW OF SYSTEMS:  Review of Systems  Constitutional: Negative for chills, fever and malaise/fatigue.  HENT: Negative for congestion, ear discharge, hearing loss and nosebleeds.   Eyes: Negative for blurred vision, double vision and photophobia.  Respiratory: Negative for cough, shortness of breath and wheezing.   Cardiovascular: Negative for chest pain, palpitations and leg swelling.  Gastrointestinal: Positive for abdominal pain, melena and nausea. Negative for constipation, diarrhea and vomiting.  Genitourinary: Negative for dysuria.  Musculoskeletal: Negative for myalgias.  Neurological: Negative for dizziness, speech change, focal weakness, seizures and headaches.  Psychiatric/Behavioral: Negative for depression.    DRUG ALLERGIES:   Allergies  Allergen Reactions  . Naproxen Other (See Comments)    Other Reaction: GI bleed  . Penicillins Swelling, Rash and Other (See Comments)    Reaction: fever Has patient had a PCN reaction causing immediate rash, facial/tongue/throat swelling, SOB or lightheadedness with hypotension: Yes Has patient had a PCN reaction causing severe rash involving mucus membranes or skin necrosis: No Has patient had a PCN reaction that required hospitalization: No Has patient had a PCN reaction occurring within the last 10 years: No If all of the above answers are "NO", then may proceed with Cephalosporin use.   . Gluten Meal     Bloating, gas, pain  . Lactose Intolerance (Gi)     Bloating, gas, pain  . Latex Itching    redness  . Other Diarrhea, Nausea And  Vomiting and Other (See Comments)    "DIGESTIVE ISSUES" Nuts  . Shellfish Allergy Other (See Comments)    Reaction: "I get stuffed up, not a true allergy"  . Soy Allergy Other (See Comments)    Reaction: digestive issues.  . Sulfonylureas Other (See Comments)    Reaction: Pt isn't sure what medication this one was associated with or what the reaction was.  . Doxycycline Rash  . Eggs Or Egg-Derived Products Other (See Comments)    Nasal congestion  . Humira [Adalimumab] Rash and Other (See Comments)    Reaction: fever.  . Peanut-Containing Drug Products Rash  . Simvastatin Rash  . Statins Rash  . Sulfa Antibiotics Rash  . Vancomycin Rash and Other (See Comments)    Reaction: fever    VITALS:  Blood pressure 131/63, pulse 94, temperature 97.9 F (36.6 C), resp. rate 12, height 5' 2"  (1.575 m), weight 65.7 kg (144 lb 13.5 oz), SpO2 100 %.  PHYSICAL EXAMINATION:  Physical Exam  GENERAL:  57 y.o.-year-old patient lying in the bed with no acute distress.  EYES: Pupils equal, round, reactive to light and accommodation. No scleral icterus. Extraocular muscles intact.  HEENT: Head atraumatic, normocephalic. Oropharynx and nasopharynx clear.  NECK:  Supple, no jugular venous distention. No thyroid enlargement, no tenderness.  LUNGS: Normal breath sounds bilaterally, no wheezing, rales,rhonchi or crepitation. No use of accessory muscles of respiration.  CARDIOVASCULAR: S1, S2 normal. No murmurs, rubs, or gallops.  ABDOMEN: Soft, but tender in left upper quadrant and around the umbilicus, nondistended. Ileostomy bag in place with loose dark stool Bowel sounds present. No organomegaly or mass.  EXTREMITIES: No  pedal edema, cyanosis, or clubbing.  NEUROLOGIC: Cranial nerves II through XII are intact. Muscle strength 5/5 in all extremities. Sensation intact. Gait not checked. Global weakness noted. PSYCHIATRIC: The patient is alert and oriented x 3.  SKIN: No obvious rash, lesion, or ulcer.      LABORATORY PANEL:   CBC Recent Labs  Lab 06/20/17 0447  WBC 8.7  HGB 5.8*  HCT 17.5*  PLT 292   ------------------------------------------------------------------------------------------------------------------  Chemistries  Recent Labs  Lab 06/15/17 1746  06/20/17 0447  NA 133*   < > 139  K 4.6   < > 3.4*  CL 99*   < > 105  CO2 22   < > 23  GLUCOSE 445*   < > 127*  BUN 27*   < > 10  CREATININE 0.92   < > 0.52  CALCIUM 8.6*   < > 8.2*  AST 32  --   --   ALT 16  --   --   ALKPHOS 85  --   --   BILITOT 0.2*  --   --    < > = values in this interval not displayed.   ------------------------------------------------------------------------------------------------------------------  Cardiac Enzymes No results for input(s): TROPONINI in the last 168 hours. ------------------------------------------------------------------------------------------------------------------  RADIOLOGY:  Nm Gi Blood Loss  Result Date: 06/18/2017 CLINICAL DATA:  History of Crohn's disease and gastric ulcers. Bruising duodenal ulcer on EGD yesterday with banding done. Nausea and vomiting today. Ileostomy bag right-side with loose dark stool. EXAM: NUCLEAR MEDICINE GASTROINTESTINAL BLEEDING SCAN TECHNIQUE: Sequential abdominal images were obtained following intravenous administration of Tc-69mlabeled red blood cells. RADIOPHARMACEUTICALS:  26.1 mCi Tc-977mertechnetate in-vitro labeled red cells. COMPARISON:  CT 06/15/2017 FINDINGS: Exam demonstrates evidence of patient's IV tubing over the field-of-view at various portions of the scan. There is no evidence of active GI bleed over 1 hour of imaging the abdomen and pelvis. IMPRESSION: Negative GI bleeding scan. Electronically Signed   By: DaMarin Olp.D.   On: 06/18/2017 19:54    EKG:   Orders placed or performed in visit on 12/16/14  . EKG 12-Lead  . EKG 12-Lead  . EKG 12-Lead    ASSESSMENT AND PLAN:   [age]ear old female with past  medical history significant for ulcerative colitis status post total colectomy, ileostomy, Crohn's disease, arthritis, diabetes, GERD presents to hospital secondary to abdominal pain and melena  1. Acute anemia- secondary to GI bleed. -Patient had an oozing duodenal ulcer on EGD. S/p banding - However had active bleeding episode resulting in transfer to ICU. -Repeat EGD showing that the duodenal ulcer bleeding has stopped. So she could have other bleeding ulcers from her Crohn's in her small intestines. -GI bleeding scan is negative. Appreciate vascular consult.  - Hemoglobin did drop to 5.8. -Patient is a Jehovah's Witness-refuses blood transfusion strictly. - -Status post epogen, IV iron for 3 days. If needed IV albumin is okay as well -Continue to monitor her hemoglobin, heart rate and blood pressure closely. -Oxygen support -Likely will be started on clear liquids today  2. Crohn's disease- likely active with bleeding ulcers -On Bentyl, Creon, Levsin and pain medications. -Started IV steroids as per GI recommendations   3. Diabetes mellitus-sliding scale insulin  - Will be started on basal insulin and adjust the dose as she'll be started on clears. -Her home dose is Levemir 45 units at bedtime.    4. Chronic abdominal pain-continue pain medications  5. DVT prophylaxis-Ted's and SCDs.  -patient is critically ill  and high risk for hemorrhagic shock. Risks of not getting the transfusion has been explained.  Patient will be transferred to floor today with telemetry monitoring   the records are reviewed and case discussed with Care Management/Social Workerr. Management plans discussed with the patient, family and they are in agreement.  CODE STATUS: Full code  TOTAL CRITICAL CARE TIME SPENT IN TAKING CARE OF THIS PATIENT: 38 minutes.   POSSIBLE D/C IN ? DAYS, DEPENDING ON CLINICAL CONDITION.   Gladstone Lighter M.D on 06/20/2017 at 11:43 AM  Between 7am to 6pm - Pager -  [phone]  After 6pm go to [email or web address] - password [email or web address] [facility]  Office  [phone]  CC: Primary care physician; Sharyne Peach, MD

## 2017-06-20 NOTE — Progress Notes (Signed)
Patient slept all night after receiving pain med for abdominal pain.Minister was in last pm and very involved in patient's care. Requested pediatric tubes for blood draws. HGB dropped to 5.8. Pt is jehovah's Witness and GI MD spoke with patient yesterday about the consequences of no blood products. Continue to monitor.

## 2017-06-20 NOTE — Progress Notes (Signed)
Report called to Josh, RN on [place]; pt being transferred to 211.  Bubba Camp, RN

## 2017-06-21 LAB — GLUCOSE, CAPILLARY
GLUCOSE-CAPILLARY: 338 mg/dL — AB (ref 65–99)
GLUCOSE-CAPILLARY: 352 mg/dL — AB (ref 65–99)
Glucose-Capillary: 192 mg/dL — ABNORMAL HIGH (ref 65–99)
Glucose-Capillary: 226 mg/dL — ABNORMAL HIGH (ref 65–99)

## 2017-06-21 LAB — BASIC METABOLIC PANEL
ANION GAP: 12 (ref 5–15)
BUN: 16 mg/dL (ref 6–20)
CHLORIDE: 102 mmol/L (ref 101–111)
CO2: 22 mmol/L (ref 22–32)
Calcium: 8.5 mg/dL — ABNORMAL LOW (ref 8.9–10.3)
Creatinine, Ser: 0.66 mg/dL (ref 0.44–1.00)
GFR calc non Af Amer: >60 mL/min (ref >60)
Glucose, Bld: 220 mg/dL — ABNORMAL HIGH (ref 65–99)
Potassium: 3.6 mmol/L (ref 3.5–5.1)
Sodium: 136 mmol/L (ref 135–145)

## 2017-06-21 LAB — CBC
HCT: 17.4 % — ABNORMAL LOW (ref 35.0–47.0)
Hemoglobin: 5.7 g/dL — ABNORMAL LOW (ref 12.0–16.0)
MCH: 33 pg (ref 26.0–34.0)
MCHC: 32.6 g/dL (ref 32.0–36.0)
MCV: 101.2 fL — AB (ref 80.0–100.0)
PLATELETS: 381 10*3/uL (ref 150–440)
RBC: 1.72 MIL/uL — AB (ref 3.80–5.20)
RDW: 15.3 % — ABNORMAL HIGH (ref 11.5–14.5)
WBC: 14.2 10*3/uL — ABNORMAL HIGH (ref 3.6–11.0)

## 2017-06-21 NOTE — Progress Notes (Signed)
Caroline Lame, MD [facility]   [address] [place], [place] [address] Phone: [phone] Fax : [phone]   Subjective: The patient states that she had a small amount of black material in her ostomy bag.  He denies any abdominal pain nausea vomiting fevers or chills.  The patient's hemoglobin had gone up but then came back down this morning.  She continues to feel weak and states that she was dizzy when she tried to stand up.   Objective: Vital signs in last 24 hours: Vitals:   06/20/17 1522 06/20/17 2143 06/21/17 0449 06/21/17 1203  BP: 138/68 131/66 (!) 116/50 (!) 123/53  Pulse: (!) 102 (!) 116 (!) 114 (!) 119  Resp: 20 18 (!) 22 16  Temp: 98 F (36.7 C) 98.7 F (37.1 C) 98.1 F (36.7 C) 98.4 F (36.9 C)  TempSrc: Oral Oral Oral Oral  SpO2: 100% 97% 99% 98%  Weight:      Height:       Weight change:   Intake/Output Summary (Last 24 hours) at 06/21/2017 1322 Last data filed at 06/21/2017 1145 Gross per 24 hour  Intake 1020 ml  Output 2400 ml  Net -1380 ml     Exam: Heart:: Regular rate and rhythm, S1S2 present or without murmur or extra heart sounds Lungs: normal and clear to auscultation and percussion Abdomen: soft, nontender, normal bowel sounds   Lab Results: @LABTEST2 @ Micro Results: Recent Results (from the past 240 hour(s))  MRSA PCR Screening     Status: None   Collection Time: 06/18/17  8:15 PM  Result Value Ref Range Status   MRSA by PCR NEGATIVE NEGATIVE Final    Comment:        The GeneXpert MRSA Assay (FDA approved for NASAL specimens only), is one component of a comprehensive MRSA colonization surveillance program. It is not intended to diagnose MRSA infection nor to guide or monitor treatment for MRSA infections. Performed at [facility], [address] [place], [place] [address]    Studies/Results: No results found. Medications: I have reviewed the patient's current medications. Scheduled Meds: . baclofen  10 mg  Oral BID  . darbepoetin (ARANESP) injection - NON-DIALYSIS  40 mcg Subcutaneous Q Fri-1800  . dicyclomine  10 mg Oral q morning - 10a  . feeding supplement  1 Container Oral TID BM  . insulin aspart  0-5 Units Subcutaneous QHS  . insulin aspart  0-9 Units Subcutaneous TID WC  . insulin detemir  14 Units Subcutaneous QHS  . lipase/protease/amylase  24,000 Units Oral Daily  . methylPREDNISolone (SOLU-MEDROL) injection  60 mg Intravenous Q24H  . Milnacipran  50 mg Oral BID  . montelukast  10 mg Oral QHS  . multivitamin with minerals  1 tablet Oral Daily  . traZODone  200 mg Oral QHS   Continuous Infusions: . iron sucrose Stopped (06/20/17 1748)   PRN Meds:.acetaminophen **OR** [DISCONTINUED] acetaminophen, albuterol, gabapentin, hydrOXYzine, hyoscyamine, LORazepam, morphine injection, [DISCONTINUED] ondansetron **OR** ondansetron (ZOFRAN) IV, oxyCODONE, promethazine   Assessment: Principal Problem:   Melena Active Problems:   Anxiety   Controlled type 2 diabetes mellitus with diabetic nephropathy, with long-term current use of insulin (HCC)   Crohn disease (HCC)   GERD (gastroesophageal reflux disease)    Plan: This patient is admitted with profound anemia and a Jehovah's Witness.  The patient has refused blood transfusions.  The patient suffers from Crohn's disease and was diagnosed with a GI bleed.  The patient is now being treated with iron  and vitamins bandits.  Tomorrow Dr. Marius Ditch will be taking over and may have some further ideas on how to better manage this patient's Crohn's disease.   LOS: 6 days   Renold Kozar 06/21/2017, 1:22 PM

## 2017-06-21 NOTE — Progress Notes (Signed)
Patient complains of dizziness and feels faint. Educated patient to remain in bed and signs of anemia. Notified Dr. Estanislado Pandy of patient's complains, hemoglobin of 6.1 and that she refuses blood products. No new orders were given. Will continue to monitor patient.

## 2017-06-21 NOTE — Progress Notes (Signed)
[facility] [place] NAME: Caroline Hughes    MR#:  [id number]  DATE OF BIRTH:  1960/09/30  SUBJECTIVE:  CHIEF COMPLAINT:   Chief Complaint  Patient presents with  . Melena   - feels weak, tied, hb down to 5.7 - still dizzy and light headed-refuses blood transfusion. -Minimal melena noted in the ileostomy bag  REVIEW OF SYSTEMS:  Review of Systems  Constitutional: Negative for chills, fever and malaise/fatigue.  HENT: Negative for congestion, ear discharge, hearing loss and nosebleeds.   Eyes: Negative for blurred vision, double vision and photophobia.  Respiratory: Negative for cough, shortness of breath and wheezing.   Cardiovascular: Negative for chest pain, palpitations and leg swelling.  Gastrointestinal: Positive for abdominal pain, melena and nausea. Negative for constipation, diarrhea and vomiting.  Genitourinary: Negative for dysuria.  Musculoskeletal: Negative for myalgias.  Neurological: Negative for dizziness, speech change, focal weakness, seizures and headaches.  Psychiatric/Behavioral: Negative for depression.    DRUG ALLERGIES:   Allergies  Allergen Reactions  . Naproxen Other (See Comments)    Other Reaction: GI bleed  . Penicillins Swelling, Rash and Other (See Comments)    Reaction: fever Has patient had a PCN reaction causing immediate rash, facial/tongue/throat swelling, SOB or lightheadedness with hypotension: Yes Has patient had a PCN reaction causing severe rash involving mucus membranes or skin necrosis: No Has patient had a PCN reaction that required hospitalization: No Has patient had a PCN reaction occurring within the last 10 years: No If all of the above answers are "NO", then may proceed with Cephalosporin use.   . Gluten Meal     Bloating, gas, pain  . Lactose Intolerance (Gi)     Bloating, gas, pain  . Latex Itching    redness  . Other Diarrhea, Nausea And Vomiting and Other (See Comments)     "DIGESTIVE ISSUES" Nuts  . Shellfish Allergy Other (See Comments)    Reaction: "I get stuffed up, not a true allergy"  . Soy Allergy Other (See Comments)    Reaction: digestive issues.  . Sulfonylureas Other (See Comments)    Reaction: Pt isn't sure what medication this one was associated with or what the reaction was.  . Doxycycline Rash  . Eggs Or Egg-Derived Products Other (See Comments)    Nasal congestion  . Humira [Adalimumab] Rash and Other (See Comments)    Reaction: fever.  . Peanut-Containing Drug Products Rash  . Simvastatin Rash  . Statins Rash  . Sulfa Antibiotics Rash  . Vancomycin Rash and Other (See Comments)    Reaction: fever    VITALS:  Blood pressure (!) 116/50, pulse (!) 114, temperature 98.1 F (36.7 C), temperature source Oral, resp. rate (!) 22, height 5' 2"  (1.575 m), weight 65.7 kg (144 lb 13.5 oz), SpO2 99 %.  PHYSICAL EXAMINATION:  Physical Exam  GENERAL:  57 y.o.-year-old patient lying in the bed with no acute distress.  EYES: Pupils equal, round, reactive to light and accommodation. No scleral icterus. Extraocular muscles intact.  HEENT: Head atraumatic, normocephalic. Oropharynx and nasopharynx clear.  NECK:  Supple, no jugular venous distention. No thyroid enlargement, no tenderness.  LUNGS: Normal breath sounds bilaterally, no wheezing, rales,rhonchi or crepitation. No use of accessory muscles of respiration.  CARDIOVASCULAR: S1, S2 normal. No murmurs, rubs, or gallops.  ABDOMEN: Soft, but tender in left upper quadrant and around the umbilicus, nondistended. Ileostomy bag in place with small amount of loose dark stool Bowel sounds  present. No organomegaly or mass.  EXTREMITIES: No pedal edema, cyanosis, or clubbing.  NEUROLOGIC: Cranial nerves II through XII are intact. Muscle strength 5/5 in all extremities. Sensation intact. Gait not checked. Global weakness noted. PSYCHIATRIC: The patient is alert and oriented x 3.  SKIN: No obvious rash,  lesion, or ulcer.    LABORATORY PANEL:   CBC Recent Labs  Lab 06/21/17 0516  WBC 14.2*  HGB 5.7*  HCT 17.4*  PLT 381   ------------------------------------------------------------------------------------------------------------------  Chemistries  Recent Labs  Lab 06/15/17 1746  06/21/17 0516  NA 133*   < > 136  K 4.6   < > 3.6  CL 99*   < > 102  CO2 22   < > 22  GLUCOSE 445*   < > 220*  BUN 27*   < > 16  CREATININE 0.92   < > 0.66  CALCIUM 8.6*   < > 8.5*  AST 32  --   --   ALT 16  --   --   ALKPHOS 85  --   --   BILITOT 0.2*  --   --    < > = values in this interval not displayed.   ------------------------------------------------------------------------------------------------------------------  Cardiac Enzymes No results for input(s): TROPONINI in the last 168 hours. ------------------------------------------------------------------------------------------------------------------  RADIOLOGY:  No results found.  EKG:   Orders placed or performed in visit on 12/16/14  . EKG 12-Lead  . EKG 12-Lead  . EKG 12-Lead    ASSESSMENT AND PLAN:   57 year old female with past medical history significant for ulcerative colitis status post total colectomy, ileostomy, Crohn's disease, arthritis, diabetes, GERD presents to hospital secondary to abdominal pain and melena  1. Acute anemia- secondary to GI bleed. -Patient had an oozing duodenal ulcer on EGD. S/p banding - However had active bleeding episode resulting in transfer to ICU. -Repeat EGD showing that the duodenal ulcer bleeding has stopped. So she could have other bleeding ulcers from her Crohn's in her small intestines. -GI bleeding scan is negative. Appreciate vascular consult.  - Hemoglobin did drop to 5.7. -Patient is a Jehovah's Witness-refuses blood transfusion strictly. - -Status post epogen, IV iron for 3 days. If needed IV albumin is okay as well -Continue to monitor her hemoglobin, heart rate and  blood pressure closely. -Oxygen support -advance to full liquids now  2. Crohn's disease- likely active with bleeding ulcers -On Bentyl, Creon, Levsin and pain medications. -on IV steroids as per GI recommendations   3. Diabetes mellitus-sliding scale insulin  -  on basal insulin and adjust the dose as she'll be started on liquid diet. -Her home dose is Levemir 45 units at bedtime.    4. Chronic abdominal pain-continue pain medications  5. DVT prophylaxis-Ted's and SCDs.  -patient is critically ill and high risk for hemorrhagic shock. Risks of not getting the transfusion has been explained.       the records are reviewed and case discussed with Care Management/Social Workerr. Management plans discussed with the patient, family and they are in agreement.  CODE STATUS: Full code  TOTAL  TIME SPENT IN TAKING CARE OF THIS PATIENT: 39 minutes.   POSSIBLE D/C IN ? DAYS, DEPENDING ON CLINICAL CONDITION.   Gladstone Lighter M.D on 06/21/2017 at 10:35 AM  Between 7am to 6pm - Pager - [phone]  After 6pm go to [email or web address] - password [email or web address] [facility]  Office  [phone]  CC: Primary care physician; Sharyne Peach, MD

## 2017-06-22 LAB — CBC
HCT: 14.7 % — CL (ref 35.0–47.0)
Hemoglobin: 4.9 g/dL — CL (ref 12.0–16.0)
MCH: 34.1 pg — AB (ref 26.0–34.0)
MCHC: 33.4 g/dL (ref 32.0–36.0)
MCV: 102.1 fL — AB (ref 80.0–100.0)
PLATELETS: 433 10*3/uL (ref 150–440)
RBC: 1.44 MIL/uL — AB (ref 3.80–5.20)
RDW: 16.4 % — AB (ref 11.5–14.5)
WBC: 14 10*3/uL — ABNORMAL HIGH (ref 3.6–11.0)

## 2017-06-22 LAB — GLUCOSE, CAPILLARY
GLUCOSE-CAPILLARY: 311 mg/dL — AB (ref 65–99)
Glucose-Capillary: 113 mg/dL — ABNORMAL HIGH (ref 65–99)
Glucose-Capillary: 99 mg/dL (ref 65–99)
Glucose-Capillary: 257 mg/dL — ABNORMAL HIGH (ref 65–99)
Glucose-Capillary: 225 mg/dL — ABNORMAL HIGH (ref 65–99)

## 2017-06-22 MED ORDER — CYANOCOBALAMIN 1000 MCG/ML IJ SOLN
1000.0000 ug | Freq: Once | INTRAMUSCULAR | Status: AC
  Administered 2017-06-22: 1000 ug via INTRAMUSCULAR
  Filled 2017-06-22: qty 1

## 2017-06-22 MED ORDER — METHYLPREDNISOLONE SODIUM SUCC 125 MG IJ SOLR: 60.0000 mg | INTRAMUSCULAR | 0 refills | Status: DC

## 2017-06-22 MED ORDER — SODIUM CHLORIDE 0.9 % IV SOLN
200.0000 mg | INTRAVENOUS | Status: AC
  Administered 2017-06-22 – 2017-06-24 (×2): 200 mg via INTRAVENOUS
  Filled 2017-06-22 (×4): qty 10

## 2017-06-22 MED ORDER — INSULIN DETEMIR 100 UNIT/ML ~~LOC~~ SOLN: 14.0000 [IU] | Freq: Every day | SUBCUTANEOUS | 11 refills | Status: AC

## 2017-06-22 MED ORDER — POLYETHYLENE GLYCOL 3350 17 GM/SCOOP PO POWD
1.0000 | Freq: Once | ORAL | Status: AC
  Administered 2017-06-22: 255 g via ORAL
  Filled 2017-06-22: qty 255

## 2017-06-22 NOTE — Progress Notes (Addendum)
[facility] at [place] NAME: Caroline Hughes    MR#:  [id number]  DATE OF BIRTH:  23-Feb-1961  SUBJECTIVE:  CHIEF COMPLAINT:   Chief Complaint  Patient presents with  . 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  HENT: Negative for congestion, ear discharge, hearing loss and nosebleeds.   Eyes: Negative for blurred vision, double vision and photophobia.  Respiratory: Negative for cough, shortness of breath and wheezing.   Cardiovascular: Negative for chest pain, palpitations and leg swelling.  Gastrointestinal: Positive for abdominal pain, melena and nausea. Negative for constipation, diarrhea and vomiting.  Genitourinary: Negative for dysuria.  Musculoskeletal: Negative for myalgias.  Neurological: Negative for dizziness, speech change, focal weakness, seizures and headaches.  Psychiatric/Behavioral: Negative for depression.    DRUG ALLERGIES:   Allergies  Allergen Reactions  . Naproxen Other (See Comments)    Other Reaction: GI bleed  . Penicillins Swelling, Rash and Other (See Comments)    Reaction: fever Has patient had a PCN reaction causing immediate rash, facial/tongue/throat swelling, SOB or lightheadedness with hypotension: Yes Has patient had a PCN reaction causing severe rash involving mucus membranes or skin necrosis: No Has patient had a PCN reaction that required hospitalization: No Has patient had a PCN reaction occurring within the last 10 years: No If all of the above answers are "NO", then may proceed with Cephalosporin use.   . Gluten Meal     Bloating, gas, pain  . Lactose Intolerance (Gi)     Bloating, gas, pain  . Latex Itching    redness  . Other Diarrhea, Nausea And Vomiting and Other (See Comments)    "DIGESTIVE  ISSUES" Nuts  . Shellfish Allergy Other (See Comments)    Reaction: "I get stuffed up, not a true allergy"  . Soy Allergy Other (See Comments)    Reaction: digestive issues.  . Sulfonylureas Other (See Comments)    Reaction: Pt isn't sure what medication this one was associated with or what the reaction was.  . Doxycycline Rash  . Eggs Or Egg-Derived Products Other (See Comments)    Nasal congestion  . Humira [Adalimumab] Rash and Other (See Comments)    Reaction: fever.  . Peanut-Containing Drug Products Rash  . Simvastatin Rash  . Statins Rash  . Sulfa Antibiotics Rash  . Vancomycin Rash and Other (See Comments)    Reaction: fever    VITALS:  Blood pressure (!) 103/54, pulse (!) 133, temperature 97.6 F (36.4 C), temperature source Oral, resp. rate 18, height 5' 2"  (1.575 m), weight 65.7 kg (144 lb 13.5 oz), SpO2 100 %.  PHYSICAL EXAMINATION:  Physical Exam  GENERAL:  57 y.o.-year-old patient lying in the bed with no acute distress.  EYES: Pupils equal, round, reactive to light and accommodation. No scleral icterus. Extraocular muscles intact.  HEENT: Head atraumatic, normocephalic. Oropharynx and nasopharynx clear.  NECK:  Supple, no jugular venous distention. No thyroid enlargement, no tenderness.  LUNGS: Normal breath sounds bilaterally, no wheezing, rales,rhonchi or crepitation. No use of accessory muscles of respiration.  CARDIOVASCULAR: S1, S2 normal. No murmurs, rubs, or gallops.  ABDOMEN: Soft, but tender in left upper quadrant and around the umbilicus, nondistended. Ileostomy bag in place with small amount of loose dark stool Bowel sounds present. No organomegaly  or mass.  EXTREMITIES: No pedal edema, cyanosis, or clubbing.  NEUROLOGIC: Cranial nerves II through XII are intact. Muscle strength 5/5 in all extremities. Sensation intact. Gait not checked. Global weakness noted. PSYCHIATRIC: The patient is alert and oriented x 3.  SKIN: No obvious rash, lesion, or ulcer.     LABORATORY PANEL:   CBC Recent Labs  Lab 06/22/17 0426  WBC 14.0*  HGB 4.9*  HCT 14.7*  PLT 433   ------------------------------------------------------------------------------------------------------------------  Chemistries  Recent Labs  Lab 06/15/17 1746  06/21/17 0516  NA 133*   < > 136  K 4.6   < > 3.6  CL 99*   < > 102  CO2 22   < > 22  GLUCOSE 445*   < > 220*  BUN 27*   < > 16  CREATININE 0.92   < > 0.66  CALCIUM 8.6*   < > 8.5*  AST 32  --   --   ALT 16  --   --   ALKPHOS 85  --   --   BILITOT 0.2*  --   --    < > = values in this interval not displayed.   ------------------------------------------------------------------------------------------------------------------  Cardiac Enzymes No results for input(s): TROPONINI in the last 168 hours. ------------------------------------------------------------------------------------------------------------------  RADIOLOGY:  No results found.  EKG:   Orders placed or performed in visit on 12/16/14  . EKG 12-Lead  . EKG 12-Lead  . EKG 12-Lead    ASSESSMENT AND PLAN:   57 year old female with past medical history significant for ulcerative colitis status post total colectomy, ileostomy, Crohn's disease, arthritis, diabetes, GERD presents to hospital secondary to abdominal pain and melena  1. Acute anemia- secondary to GI bleed. -Patient had an oozing duodenal ulcer on EGD. S/p banding - However had active bleeding episode resulting in transfer to ICU. -Repeat EGD showing that the duodenal ulcer bleeding has stopped. So she could have other bleeding ulcers from her Crohn's in her small intestines. -GI bleeding scan is negative. Appreciate vascular consult.  - Hemoglobin did drop to 4.9 -Patient is a Jehovah's Witness-refuses blood transfusion strictly. - -Status post epogen, IV iron. If needed IV albumin is okay as well -Continue to monitor her hemoglobin, heart rate and blood pressure closely. -Oxygen  support -on full liquids now - GI re-eval to see if patient needs to be started on remecaide  2. Crohn's disease- likely active with bleeding ulcers -On Bentyl, Creon, Levsin and pain medications. -on IV steroids as per GI recommendations   3. Diabetes mellitus-sliding scale insulin  -  on basal insulin and adjust the dose as she'll be started on liquid diet. -Her home dose is Levemir 45 units at bedtime.    4. Chronic abdominal pain-continue pain medications  5. DVT prophylaxis-Ted's and SCDs.  -patient is critically ill and high risk for hemorrhagic shock. - Risks of not getting the transfusion has been explained. - her ministers and family has been updated     the records are reviewed and case discussed with Care Management/Social Workerr. Management plans discussed with the patient, family and they are in agreement.  CODE STATUS: Full code  TOTAL CRITICAL CARE TIME SPENT IN TAKING CARE OF THIS PATIENT: 45 minutes.   POSSIBLE D/C IN ? DAYS, DEPENDING ON CLINICAL CONDITION.   Gladstone Lighter M.D on 06/22/2017 at 12:11 PM  Between 7am to 6pm - Pager - [phone]  After 6pm go to [email or web address] - password [email or web address]  [employer]  [phone]  CC: Primary care physician; Sharyne Peach, MD

## 2017-06-22 NOTE — Care Management Important Message (Signed)
Important Message  Patient Details  Name: Caroline Hughes MRN: [id number] Date of Birth: Aug 28, 1960   Medicare Important Message Given:  Yes    Beverly Sessions, RN 06/22/2017, 3:01 PM

## 2017-06-22 NOTE — Progress Notes (Signed)
Report call to [facility] in ICU. Pt is awake and oriented.

## 2017-06-22 NOTE — Progress Notes (Signed)
Critical hemoglobin and hematocrit reported by lab of 4.9/14.7. Notified Dr.Diamond of patient's results. He stated he would come and talk to the patient this morning. Terrial Rhodes

## 2017-06-22 NOTE — Progress Notes (Signed)
Pt transfer to ICU.

## 2017-06-22 NOTE — Discharge Summary (Signed)
[facility] NAME: Caroline Hughes    MR#:  [id number]  DATE OF BIRTH:  11-03-1960  DATE OF ADMISSION:  06/15/2017   ADMITTING PHYSICIAN: Lance Coon, MD  DATE OF DISCHARGE: No discharge date for patient encounter.  PRIMARY CARE PHYSICIAN: Sharyne Peach, MD   ADMISSION DIAGNOSIS:   Acute GI bleeding [K92.2]  DISCHARGE DIAGNOSIS:   Principal Problem:   Melena Active Problems:   Anxiety   Controlled type 2 diabetes mellitus with diabetic nephropathy, with long-term current use of insulin (HCC)   Crohn disease (HCC)   GERD (gastroesophageal reflux disease)   SECONDARY DIAGNOSIS:   Past Medical History:  Diagnosis Date  . Anemia    Iron deficiency IRON INFUSIONS X 5 WEEKS  . Anxiety   . Arthritis   . Asthma   . Bronchitis 2017  . Complication of anesthesia   . Crohn's disease ([place])   . Depression   . Diabetes mellitus without complication ([place])   . Endometriosis   . Fibromyalgia   . Gastric ulcer   . GERD (gastroesophageal reflux disease)   . Herniated disc, cervical   . Hyperlipemia   . Intestinal obstruction ([place])   . Migraine    CHRONIC  . Nausea    CHRONIC  . Neuropathy   . Pain    BACK  . Peristomal hernia   . PONV (postoperative nausea and vomiting)   . Ulcerative colitis [facility])     HOSPITAL COURSE:   57 year old female with past medical history significant for ulcerative colitis status post total colectomy, ileostomy, Crohn's disease, arthritis, diabetes, GERD presents to hospital secondary to abdominal pain and melena  1. Acute anemia- secondary to GI bleed. -Patient had an oozing duodenal ulcer on EGD. S/p banding - However had active bleeding episode resulting in transfer to ICU. -Repeat EGD showing that the duodenal ulcer bleeding has stopped. So she could have other bleeding ulcers from her Crohn's in her small intestines. -GI bleeding scan is negative. Appreciate vascular consult.    - Hemoglobin did drop to 4.9. -Patient is a Jehovah's Witness-refuses blood transfusion strictly. - -Status post epogen, IV iron. If needed IV albumin is okay as well -Continue to monitor her hemoglobin, heart rate and blood pressure closely. -Oxygen support -on full liquids now - GI re-eval to see - for ileoscopy, capsule endoscopy or empiric Remicade. -GI from [facility] and do Have been reached out to. At this time they do not have any beds. We'll move the patient to stepdown unit and continue to monitor. Critically ill at this time.  2. Crohn's disease- likely active with bleeding ulcers -On Bentyl, Creon, Levsin and pain medications. -on IV steroids as per GI recommendations   3. Diabetes mellitus-sliding scale insulin  -  on basal insulin and adjust the dose as she'll be started on liquid diet. -Her home dose is Levemir 45 units at bedtime.    4. Chronic abdominal pain-continue pain medications  5. DVT prophylaxis-Ted's and SCDs.  -patient is critically ill and high risk for hemorrhagic shock. - Risks of not getting the transfusion has been explained. - her ministers and family has been updated    DISCHARGE CONDITIONS:   CRITICAL  CONSULTS OBTAINED:   Treatment Team:  [name], MD  DRUG ALLERGIES:   Allergies  Allergen Reactions  . Naproxen Other (See Comments)    Other Reaction: GI bleed  . Penicillins Swelling, Rash and Other (See Comments)  Reaction: fever Has patient had a PCN reaction causing immediate rash, facial/tongue/throat swelling, SOB or lightheadedness with hypotension: Yes Has patient had a PCN reaction causing severe rash involving mucus membranes or skin necrosis: No Has patient had a PCN reaction that required hospitalization: No Has patient had a PCN reaction occurring within the last 10 years: No If all of the above answers are "NO", then may proceed with Cephalosporin use.   . Gluten Meal     Bloating, gas, pain  . Lactose Intolerance  (Gi)     Bloating, gas, pain  . Latex Itching    redness  . Other Diarrhea, Nausea And Vomiting and Other (See Comments)    "DIGESTIVE ISSUES" Nuts  . Shellfish Allergy Other (See Comments)    Reaction: "I get stuffed up, not a true allergy"  . Soy Allergy Other (See Comments)    Reaction: digestive issues.  . Sulfonylureas Other (See Comments)    Reaction: Pt isn't sure what medication this one was associated with or what the reaction was.  . Doxycycline Rash  . Eggs Or Egg-Derived Products Other (See Comments)    Nasal congestion  . Humira [Adalimumab] Rash and Other (See Comments)    Reaction: fever.  . Peanut-Containing Drug Products Rash  . Simvastatin Rash  . Statins Rash  . Sulfa Antibiotics Rash  . Vancomycin Rash and Other (See Comments)    Reaction: fever   DISCHARGE MEDICATIONS:   Allergies as of 06/22/2017      Reactions   Naproxen Other (See Comments)   Other Reaction: GI bleed   Penicillins Swelling, Rash, Other (See Comments)   Reaction: fever Has patient had a PCN reaction causing immediate rash, facial/tongue/throat swelling, SOB or lightheadedness with hypotension: Yes Has patient had a PCN reaction causing severe rash involving mucus membranes or skin necrosis: No Has patient had a PCN reaction that required hospitalization: No Has patient had a PCN reaction occurring within the last 10 years: No If all of the above answers are "NO", then may proceed with Cephalosporin use.   Gluten Meal    Bloating, gas, pain   Lactose Intolerance (gi)    Bloating, gas, pain   Latex Itching   redness   Other Diarrhea, Nausea And Vomiting, Other (See Comments)   "DIGESTIVE ISSUES" Nuts   Shellfish Allergy Other (See Comments)   Reaction: "I get stuffed up, not a true allergy"   Soy Allergy Other (See Comments)   Reaction: digestive issues.   Sulfonylureas Other (See Comments)   Reaction: Pt isn't sure what medication this one was associated with or what the  reaction was.   Doxycycline Rash   Eggs Or Egg-derived Products Other (See Comments)   Nasal congestion   Humira [adalimumab] Rash, Other (See Comments)   Reaction: fever.   Peanut-containing Drug Products Rash   Simvastatin Rash   Statins Rash   Sulfa Antibiotics Rash   Vancomycin Rash, Other (See Comments)   Reaction: fever      Medication List    STOP taking these medications   CIMZIA 2 X 200 MG Kit Generic drug:  Certolizumab Pegol   metaxalone 800 MG tablet Commonly known as:  SKELAXIN   SUMAtriptan 50 MG tablet Commonly known as:  IMITREX   tiZANidine 4 MG tablet Commonly known as:  ZANAFLEX   traMADol 50 MG tablet Commonly known as:  ULTRAM     TAKE these medications   albuterol 108 (90 Base) MCG/ACT inhaler Commonly known as:  PROVENTIL HFA;VENTOLIN HFA Inhale 2 puffs into the lungs every 6 (six) hours as needed for wheezing or shortness of breath.   albuterol (2.5 MG/3ML) 0.083% nebulizer solution Commonly known as:  PROVENTIL Inhale 3 mLs into the lungs every 6 (six) hours as needed for shortness of breath.   baclofen 10 MG tablet Commonly known as:  LIORESAL Take 10 mg by mouth 2 (two) times daily.   cyanocobalamin 1000 MCG/ML injection Commonly known as:  (VITAMIN B-12) Inject 1,000 mcg into the muscle every 30 (thirty) days.   dicyclomine 10 MG capsule Commonly known as:  BENTYL Take 10 mg by mouth every morning.   gabapentin 800 MG tablet Commonly known as:  NEURONTIN Take 800 mg by mouth 3 (three) times daily as needed (pain).   hydrOXYzine 25 MG tablet Commonly known as:  ATARAX/VISTARIL Take 25 mg by mouth every 8 (eight) hours as needed for anxiety.   hyoscyamine 0.125 MG tablet Commonly known as:  LEVSIN, ANASPAZ Take 0.125 mg by mouth every 4 (four) hours as needed for bladder spasms or cramping.   insulin detemir 100 UNIT/ML injection Commonly known as:  LEVEMIR Inject 0.14 mLs (14 Units total) into the skin at bedtime. What  changed:  how much to take   LORazepam 0.5 MG tablet Commonly known as:  ATIVAN Take 0.5 mg by mouth at bedtime as needed for anxiety or sleep.   methylPREDNISolone sodium succinate 125 mg/2 mL injection Commonly known as:  SOLU-MEDROL Inject 0.96 mLs (60 mg total) into the vein daily. Start taking on:  06/23/2017   Milnacipran 50 MG Tabs tablet Commonly known as:  SAVELLA Take 50 mg by mouth 2 (two) times daily.   montelukast 10 MG tablet Commonly known as:  SINGULAIR Take 10 mg by mouth at bedtime.   ondansetron 8 MG disintegrating tablet Commonly known as:  ZOFRAN-ODT Take 8 mg by mouth 2 (two) times daily as needed for nausea/vomiting.   Pancrelipase (Lip-Prot-Amyl) 25000 units Cpep Take 1 capsule by mouth daily.   ranitidine 150 MG capsule Commonly known as:  ZANTAC Take 150 mg by mouth daily as needed for heartburn.   sucralfate 1 GM/10ML suspension Commonly known as:  CARAFATE Take 1 g by mouth 4 (four) times daily -  with meals and at bedtime.   traZODone 100 MG tablet Commonly known as:  DESYREL Take 200 mg by mouth at bedtime.        DISCHARGE INSTRUCTIONS:   Will be transferred to a higher care center  If you experience worsening of your admission symptoms, develop shortness of breath, life threatening emergency, suicidal or homicidal thoughts you must seek medical attention immediately by calling 911 or calling your MD immediately  if symptoms less severe.  You Must read complete instructions/literature along with all the possible adverse reactions/side effects for all the Medicines you take and that have been prescribed to you. Take any new Medicines after you have completely understood and accpet all the possible adverse reactions/side effects.   Please note  You were cared for by a hospitalist during your hospital stay. If you have any questions about your discharge medications or the care you received while you were in the hospital after you are  discharged, you can call the unit and asked to speak with the hospitalist on call if the hospitalist that took care of you is not available. Once you are discharged, your primary care physician will handle any further medical issues. Please note that NO REFILLS for  any discharge medications will be authorized once you are discharged, as it is imperative that you return to your primary care physician (or establish a relationship with a primary care physician if you do not have one) for your aftercare needs so that they can reassess your need for medications and monitor your lab values.    On the day of Discharge:  VITAL SIGNS:   Blood pressure 104/62, pulse (!) 128, temperature 98.1 F (36.7 C), temperature source Oral, resp. rate 19, height 5' 2"  (1.575 m), weight 65.3 kg (143 lb 14.4 oz), SpO2 99 %.  PHYSICAL EXAMINATION:    GENERAL:  57 y.o.-year-old patient lying in the bed with no acute distress.  EYES: Pupils equal, round, reactive to light and accommodation. No scleral icterus. Extraocular muscles intact.  HEENT: Head atraumatic, normocephalic. Oropharynx and nasopharynx clear.  NECK:  Supple, no jugular venous distention. No thyroid enlargement, no tenderness.  LUNGS: Normal breath sounds bilaterally, no wheezing, rales,rhonchi or crepitation. No use of accessory muscles of respiration.  CARDIOVASCULAR: S1, S2 normal. No murmurs, rubs, or gallops.  ABDOMEN: Soft, but tender in left upper quadrant and around the umbilicus, nondistended. Ileostomy bag in place with small amount of loose dark stool Bowel sounds present. No organomegaly or mass.  EXTREMITIES: No pedal edema, cyanosis, or clubbing.  NEUROLOGIC: Cranial nerves II through XII are intact. Muscle strength 5/5 in all extremities. Sensation intact. Gait not checked. Global weakness noted. PSYCHIATRIC: The patient is alert and oriented x 3.  SKIN: No obvious rash, lesion, or ulcer.     DATA REVIEW:   CBC Recent Labs  Lab  06/22/17 0426  WBC 14.0*  HGB 4.9*  HCT 14.7*  PLT 433    Chemistries  Recent Labs  Lab 06/15/17 1746  06/21/17 0516  NA 133*   < > 136  K 4.6   < > 3.6  CL 99*   < > 102  CO2 22   < > 22  GLUCOSE 445*   < > 220*  BUN 27*   < > 16  CREATININE 0.92   < > 0.66  CALCIUM 8.6*   < > 8.5*  AST 32  --   --   ALT 16  --   --   ALKPHOS 85  --   --   BILITOT 0.2*  --   --    < > = values in this interval not displayed.     Microbiology Results  Results for orders placed or performed during the hospital encounter of 06/15/17  MRSA PCR Screening     Status: None   Collection Time: 06/18/17  8:15 PM  Result Value Ref Range Status   MRSA by PCR NEGATIVE NEGATIVE Final    Comment:        The GeneXpert MRSA Assay (FDA approved for NASAL specimens only), is one component of a comprehensive MRSA colonization surveillance program. It is not intended to diagnose MRSA infection nor to guide or monitor treatment for MRSA infections. Performed at [facility], [address] [place], [place] [address]     RADIOLOGY:  No results found.   Management plans discussed with the patient, family and they are in agreement.  CODE STATUS:     Code Status Orders  (From admission, onward)        Start     Ordered   06/15/17 2146  Full code  Continuous     06/15/17 2145    Code Status History  Date Active Date Inactive Code Status Order ID Comments User Context   This patient has a current code status but no historical code status.    Advance Directive Documentation     Most Recent Value  Type of Advance Directive  Healthcare Power of Attorney, Living will  Pre-existing out of facility DNR order (yellow form or pink MOST form)  No data  "MOST" Form in Place?  No data      TOTAL TIME TAKING CARE OF THIS PATIENT: 38 minutes.    Gladstone Lighter M.D on 06/22/2017 at 4:00 PM  Between 7am to 6pm - Pager - [phone]  After 6pm go to [email or web address] - Microbiologist  [facility]  Office  [phone]  CC: Primary care physician; Sharyne Peach, MD   Note: This dictation was prepared with Dragon dictation along with smaller phrase technology. Any transcriptional errors that result from this process are unintentional.

## 2017-06-22 NOTE — Progress Notes (Signed)
Caroline Darby, MD [address]  [place]  [place], [place] [address]  Main: [phone]  Fax: [phone] Pager: [phone]   Subjective: Pt is on clears and wanting to know further plan of care for ongoing melena. She wants to know when she could eat regular food.   Objective: Vital signs in last 24 hours: Vitals:   06/22/17 1800 06/22/17 1900 06/22/17 2000 06/22/17 2100  BP: 112/66 94/71 118/66 (!) 132/54  Pulse: (!) 102 (!) 110 100 (!) 115  Resp: 16 (!) 22 12 14   Temp:   98 F (36.7 C)   TempSrc:   Oral   SpO2: 96% 100% 95% 98%  Weight:      Height:       Weight change:   Intake/Output Summary (Last 24 hours) at 06/22/2017 2130 Last data filed at 06/22/2017 1422 Gross per 24 hour  Intake 270 ml  Output 1120 ml  Net -850 ml     Exam: Heart:: S1S2 present, regular, tachycardic Lungs: clear to auscultation Abdomen: soft, nontender, normal bowel sounds, ileostomy bag in right mid abdomen, clear melena in the ostomy bag   Lab Results: CBC Latest Ref Rng & Units 06/22/2017 06/21/2017 06/20/2017  WBC 3.6 - 11.0 K/uL 14.0(H) 14.2(H) -  Hemoglobin 12.0 - 16.0 g/dL 4.9(LL) 5.7(L) 6.1(L)  Hematocrit 35.0 - 47.0 % 14.7(LL) 17.4(L) 16.9(L)  Platelets 150 - 440 K/uL 433 381 -   CMP Latest Ref Rng & Units 06/21/2017 06/20/2017 06/19/2017  Glucose 65 - 99 mg/dL 220(H) 127(H) 177(H)  BUN 6 - 20 mg/dL 16 10 13   Creatinine 0.44 - 1.00 mg/dL 0.66 0.52 0.65  Sodium 135 - 145 mmol/L 136 139 139  Potassium 3.5 - 5.1 mmol/L 3.6 3.4(L) 3.6  Chloride 101 - 111 mmol/L 102 105 110  CO2 22 - 32 mmol/L 22 23 21(L)  Calcium 8.9 - 10.3 mg/dL 8.5(L) 8.2(L) 7.9(L)  Total Protein 6.5 - 8.1 g/dL - - -  Total Bilirubin 0.3 - 1.2 mg/dL - - -  Alkaline Phos 38 - 126 U/L - - -  AST 15 - 41 U/L - - -  ALT 14 - 54 U/L - - -   Micro Results: Recent Results (from the past 240 hour(s))  MRSA PCR Screening     Status: None   Collection Time: 06/18/17  8:15 PM  Result Value Ref Range Status     MRSA by PCR NEGATIVE NEGATIVE Final    Comment:        The GeneXpert MRSA Assay (FDA approved for NASAL specimens only), is one component of a comprehensive MRSA colonization surveillance program. It is not intended to diagnose MRSA infection nor to guide or monitor treatment for MRSA infections. Performed at [facility], [address], [place], [place] [address]    Studies/Results: No results found. Medications: I have reviewed the patient's current medications. Scheduled Meds: . baclofen  10 mg Oral BID  . darbepoetin (ARANESP) injection - NON-DIALYSIS  40 mcg Subcutaneous Q Fri-1800  . dicyclomine  10 mg Oral q morning - 10a  . insulin aspart  0-5 Units Subcutaneous QHS  . insulin aspart  0-9 Units Subcutaneous TID WC  . insulin detemir  14 Units Subcutaneous QHS  . lipase/protease/amylase  24,000 Units Oral Daily  . methylPREDNISolone (SOLU-MEDROL) injection  60 mg Intravenous Q24H  . Milnacipran  50 mg Oral BID  . montelukast  10 mg Oral QHS  . multivitamin with minerals  1 tablet  Oral Daily  . polyethylene glycol powder  1 Container Oral Once  . traZODone  200 mg Oral QHS   Continuous Infusions: . iron sucrose Stopped (06/22/17 1356)   PRN Meds:.acetaminophen **OR** [DISCONTINUED] acetaminophen, albuterol, gabapentin, hydrOXYzine, hyoscyamine, LORazepam, morphine injection, [DISCONTINUED] ondansetron **OR** ondansetron (ZOFRAN) IV, oxyCODONE, promethazine   Assessment: Principal Problem:   Melena Active Problems:   Anxiety   Controlled type 2 diabetes mellitus with diabetic nephropathy, with long-term current use of insulin (HCC)   Crohn disease (HCC)   GERD (gastroesophageal reflux disease) She has been expereincing recurrent flare ups, almost every month, symptoms include lower abdominal pain, cramps, diarrhea. She was treated with prednisone in 05/2017. This is new onset of melena for her. She denies NSAID use. She has been compliant with cimzia  every 4weeks  Plan: -Continue parenteral iron, she has normal B12 and folate levels -I personally reviewed food intolerances with her and updated allergy list -Continue solumedrol -Recommended transfer to [facility] or [facility]. They don't have beds and recommended ileoscopy and/or inpt VCE and/or push enteroscopy -Discussed with pt regarding above plan due to ongoing melena and pt would like to proceed with ileoscopy tomorrow under moderate sedation -Liquid diet -Miralax prep with gatorade -NPO past midnight except meds   LOS: 7 days   Caroline Hughes 06/22/2017, 9:30 PM

## 2017-06-22 NOTE — Progress Notes (Signed)
Patient Transferred to step down status as per [facility] request HGB down to 4.9 Lethargic and weak VS stable  We were asked to closely monitor Patient is Jehovah's witness and WILL NOT BE ABLE TO TRANSFUSE BLOOD PRODUCTS  PENDING [facility] TRANSFER   Maretta Bees Patricia Pesa, M.D.  [facility]  Medical Director [place] Director [facility] Cardio-Pulmonary Department

## 2017-06-22 NOTE — Progress Notes (Signed)
Nutrition Follow Up Note   DOCUMENTATION CODES:   Not applicable  INTERVENTION:    Recommend TPN if unable to advance pt to a soft diet as pt has been without adequate nutrition for 7 days  NUTRITION DIAGNOSIS:   Inadequate oral intake related to acute illness as evidenced by per patient/family report.  GOAL:   Patient will meet greater than or equal to 90% of their needs  MONITOR:   PO intake, Diet advancement, Supplement acceptance, Labs, I & O's, Weight trends  ASSESSMENT:   57 y.o. female with a history of DM, anemia, Crohn's disease and ulcerative colitis status post bowel resection with colostomy for over 30 years who is presenting to the emergency department with black stools from her ostomy as well as left sided abdominal pain.   Patient with oozing duodenal ulcer on EGD. S/p banding  Pt continues to have melena; hemoglobin down to 4.9. Refusing blood r/t pt is a Jehovah's witness. Pt has been without adequate nutrition and on NPO/liquid diet for 7 days. Pt avoids many foods in fear of developing diarrhea and now reports that she has allergies to these foods. Pt does not describe a true allergy but reports more of an intolerance describing side effects such as "bloating, diarrhea, and abdominal pain". Foods that pt avoids are eggs, garlic, gluten, soy, nuts and seeds, lactose, iceberg lettuce, fruits and vegetables that are high fiber, artificial sweetners, sorbitol, and shellfish. Pt is unable to have nutritional supplements r/t these intolerances. Pt will need adequate nutrition for red blood cell production. Recommend TPN if unable to advance pt to a soft diet. Pt is unable to have most of the food items on a full liquid diet and will not be able to meet her needs on this diet. No new weight since 2/28; recommend daily weights. Pt at high refeeding risk; recommend monitor K, Mg, and P labs once oral intake improves or nutrition support is initiated.   Medications reviewed and  include: darbopoetin, insulin, creon, solu-medrol, protonix, MVI, iron sucrose  Labs reviewed: wbc- 14.0(H), Hgb 4.9(L), Hct 14.7(L)  Diet Order:  Diet full liquid Room service appropriate? Yes; Fluid consistency: Thin  EDUCATION NEEDS:   Education needs have been addressed  Skin: Reviewed RN Assessment  Last BM:  3/3- type 7  Height:   Ht Readings from Last 1 Encounters:  06/18/17 5' 2"  (1.575 m)    Weight:   Wt Readings from Last 1 Encounters:  06/18/17 144 lb 13.5 oz (65.7 kg)    Ideal Body Weight:  50 kg  BMI:  Body mass index is 26.49 kg/m.  Estimated Nutritional Needs:   Kcal:  1400-1600kcal/day   Protein:  66-79g/day   Fluid:  >1.6L/day   Caroline Distance MS, RD, LDN Pager #[phone] After Hours Pager: [phone]

## 2017-06-22 NOTE — Progress Notes (Signed)
Notified of hemoglobin 4.9 decreased from 5.7 yesterday.  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  Extensive discussion regarding CODE STATUS being more difficult if she has less blood volume to circulate with CPR.  The patient is aware of this.  We discussed the plan to further evaluate the source of her bleeding and the patient expressed understanding.  Await further input from GI and possible repeat packed red blood cell scan.

## 2017-06-22 NOTE — Progress Notes (Signed)
Inpatient Diabetes Program Recommendations  AACE/ADA: New Consensus Statement on Inpatient Glycemic Control (2015)  Target Ranges:  Prepandial:   less than 140 mg/dL      Peak postprandial:   less than 180 mg/dL (1-2 hours)      Critically ill patients:  140 - 180 mg/dL  Results for CLEON, THOMA (MRN [id number]) as of 06/22/2017 09:17  Ref. Range 06/21/2017 07:41 06/21/2017 12:00 06/21/2017 17:01 06/21/2017 21:21 06/22/2017 07:37  Glucose-Capillary Latest Ref Range: 65 - 99 mg/dL 192 (H) 226 (H) 338 (H) 352 (H) 113 (H)    Review of Glycemic Control  Diabetes history: DM2 Outpatient Diabetes medications: Levemir 45 units QHS Current orders for Inpatient glycemic control: Levemir 14 units QHS, Novolog 0-9 units TID with meals, Novolog 0-5 units QHS; Solumedrol 60 mg Q24H  Inpatient Diabetes Program Recommendations: Correction (SSI): Please consider increasing Novolog correction to moderate scale (Novolog 0-15 units TID). Insulin - Meal Coverage: If steroids continued, please consider ordering Novolog 3 units TID with meals for meal coverage if patient eats at least 50% of meals.  Thanks, Barnie Alderman, RN, MSN, CDE Diabetes Coordinator Inpatient Diabetes Program [phone] (Team Pager from 8am to 5pm)

## 2017-06-22 NOTE — Progress Notes (Signed)
Extensive calls made for discussion with GI, also [facility] and [facility] for transfers. Discussed with [facility] GI physician and also do GI physician. At this time none of the other facilities have any beds. They have recommended patient be moved to stepdown unit. Discussed with our intensivist and he is agreeable. -Discussed with her GI about possibly ileoscopy or push endoscopy or inpatient Still endoscopy -Maybe empiric Remicade -Further recommendations per GI. -Discussions were made with the patient and her family and also her ministers in person.

## 2017-06-22 NOTE — Progress Notes (Signed)
   [place] at [facility] Endoscopy Day: 7 days Caroline Hughes is a 57 y.o. female presenting with Melena .   Advance care planning discussed with patient  And additional Family and her ministers at bedside. All questions in regards to overall condition and expected prognosis answered.  -Explained to patient that it is life-threatening without blood transfusion. However her believes prohibit her from getting any kind of blood transfusion. She understands the risks. She has requested to be a full code at this time. An attempt for full resuscitation. However if she cannot be weaned off the vent, she does not want to stay on the vent long-term. The decision was made to continue current code status  CODE STATUS: Full code Time spent: 18 minutes

## 2017-06-23 ENCOUNTER — Encounter: Payer: Self-pay | Admitting: Anesthesiology

## 2017-06-23 ENCOUNTER — Inpatient Hospital Stay: Payer: Medicare Other | Admitting: Registered Nurse

## 2017-06-23 ENCOUNTER — Encounter
Admission: EM | Disposition: A | Discharge status: Home Health | Payer: Self-pay | Source: Home / Self Care | Attending: Internal Medicine

## 2017-06-23 HISTORY — PX: ILEOSCOPY: SHX5434

## 2017-06-23 LAB — HEMOGLOBIN A1C
Hgb A1c MFr Bld: 7.3 % — ABNORMAL HIGH (ref 4.8–5.6)
Mean Plasma Glucose: 162.81 mg/dL

## 2017-06-23 LAB — GLUCOSE, CAPILLARY
GLUCOSE-CAPILLARY: 98 mg/dL (ref 65–99)
GLUCOSE-CAPILLARY: 286 mg/dL — AB (ref 65–99)
Glucose-Capillary: 61 mg/dL — ABNORMAL LOW (ref 65–99)

## 2017-06-23 LAB — CBC
HEMATOCRIT: 16.2 % — AB (ref 35.0–47.0)
HEMOGLOBIN: 5.4 g/dL — AB (ref 12.0–16.0)
MCH: 35.1 pg — ABNORMAL HIGH (ref 26.0–34.0)
MCHC: 33 g/dL (ref 32.0–36.0)
MCV: 106.5 fL — ABNORMAL HIGH (ref 80.0–100.0)
Platelets: 469 10*3/uL — ABNORMAL HIGH (ref 150–440)
RBC: 1.52 MIL/uL — AB (ref 3.80–5.20)
RDW: 19.4 % — ABNORMAL HIGH (ref 11.5–14.5)
WBC: 11.4 10*3/uL — ABNORMAL HIGH (ref 3.6–11.0)

## 2017-06-23 SURGERY — ILEOSCOPY, THROUGH STOMA
Anesthesia: General

## 2017-06-23 MED ORDER — MIDAZOLAM HCL 5 MG/5ML IJ SOLN
INTRAMUSCULAR | Status: AC
  Filled 2017-06-23: qty 5

## 2017-06-23 MED ORDER — FENTANYL CITRATE (PF) 100 MCG/2ML IJ SOLN
INTRAMUSCULAR | Status: DC | PRN
  Administered 2017-06-23 (×2): 50 ug via INTRAVENOUS
  Administered 2017-06-23: 25 ug via INTRAVENOUS

## 2017-06-23 MED ORDER — SODIUM CHLORIDE 0.9 % IV SOLN
INTRAVENOUS | Status: DC
  Administered 2017-06-23: 1000 mL via INTRAVENOUS

## 2017-06-23 MED ORDER — FENTANYL CITRATE (PF) 100 MCG/2ML IJ SOLN
INTRAMUSCULAR | Status: AC
  Filled 2017-06-23: qty 2

## 2017-06-23 MED ORDER — MIDAZOLAM HCL 5 MG/5ML IJ SOLN
INTRAMUSCULAR | Status: DC | PRN
  Administered 2017-06-23: 2 mg via INTRAVENOUS
  Administered 2017-06-23: 1 mg via INTRAVENOUS
  Administered 2017-06-23: 2 mg via INTRAVENOUS

## 2017-06-23 MED ORDER — SODIUM CHLORIDE 0.9 % IV BOLUS (SEPSIS)
1000.0000 mL | Freq: Once | INTRAVENOUS | Status: AC
  Administered 2017-06-23: 1000 mL via INTRAVENOUS

## 2017-06-23 NOTE — Care Management (Signed)
RNCM discussed LTAC opportunity potential with progression rounds group; acute bleed per MD and no LTAC request at this time.

## 2017-06-23 NOTE — Progress Notes (Addendum)
Ileoscopy post procedure note:  Findings: Normal exam, no active crohn's and no evidence of active bleeding or bleeding lesions in examined portion of ileum  Recs: - Resume diet - Stop solumedrol - Start prednisone quick taper, 75m for 5days, taper by 174mevery 5days until finished - Resume cimzia - If pt rebleeds, recommend push enteroscopy +/- VCE - Follow up with primary GI as out pt - Anticipate d/c home in next 1-2 days if she continues to remain stable with active GI bleed - Recommend not to check serial labs and can stop IV maintenance fluids  Will follow along with you  RoCephas DarbyMD [address] [address]Main: [phone]: [phone]: [phone]

## 2017-06-23 NOTE — Progress Notes (Signed)
To endoscopy. Report given.

## 2017-06-23 NOTE — Anesthesia Preprocedure Evaluation (Deleted)
Anesthesia Evaluation  Patient identified by MRN, date of birth, ID band Patient awake    Reviewed: Allergy & Precautions, H&P , NPO status , Patient's Chart, lab work & pertinent test results, reviewed documented beta blocker date and time   History of Anesthesia Complications (+) PONV and history of anesthetic complications  Airway Mallampati: II   Neck ROM: full    Dental  (+) Poor Dentition   Pulmonary neg pulmonary ROS, neg shortness of breath, asthma , former smoker,    Pulmonary exam normal        Cardiovascular Exercise Tolerance: Poor negative cardio ROS Normal cardiovascular exam Rhythm:regular Rate:Normal     Neuro/Psych  Headaches, PSYCHIATRIC DISORDERS Anxiety Depression  Neuromuscular disease negative neurological ROS  negative psych ROS   GI/Hepatic negative GI ROS, Neg liver ROS, PUD, GERD  ,  Endo/Other  negative endocrine ROSdiabetes  Renal/GU Renal diseasenegative Renal ROS  negative genitourinary   Musculoskeletal   Abdominal   Peds  Hematology negative hematology ROS (+) anemia ,   Anesthesia Other Findings Past Medical History: No date: Anemia     Comment:  Iron deficiency IRON INFUSIONS X 5 WEEKS No date: Anxiety No date: Arthritis No date: Asthma 2017: Bronchitis No date: Complication of anesthesia No date: Crohn's disease ([place]) No date: Depression No date: Diabetes mellitus without complication (HCC) No date: Endometriosis No date: Fibromyalgia No date: Gastric ulcer No date: GERD (gastroesophageal reflux disease) No date: Herniated disc, cervical No date: Hyperlipemia No date: Intestinal obstruction (HCC) No date: Migraine     Comment:  CHRONIC No date: Nausea     Comment:  CHRONIC No date: Neuropathy No date: Pain     Comment:  BACK No date: Peristomal hernia No date: PONV (postoperative nausea and vomiting) No date: Ulcerative colitis ([place]) Past Surgical History: No  date: ABDOMINAL HYSTERECTOMY 11/25/2016: CATARACT EXTRACTION W/PHACO; Right     Comment:  Procedure: CATARACT EXTRACTION PHACO AND INTRAOCULAR               LENS PLACEMENT ([place]);  Surgeon: Birder Robson, MD;                Location: [facility] ORS;  Service: Ophthalmology;  Laterality:              Right;  [place] 00:40 AP% 17.4 CDE 6.94 Fluid pack lot #               [id number] 12/02/2016: CATARACT EXTRACTION W/PHACO; Left     Comment:  Procedure: CATARACT EXTRACTION PHACO AND INTRAOCULAR               LENS PLACEMENT (IOC);  Surgeon: Birder Robson, MD;                Location: [facility] ORS;  Service: Ophthalmology;  Laterality:              Left;  [place] 00:34 AP% 14.2 CDE 4.94 Flyuid pack lot 3               [id number] No date: CHOLECYSTECTOMY No date: COLON RESECTION 06/17/2017: ESOPHAGOGASTRODUODENOSCOPY (EGD) WITH PROPOFOL; N/A     Comment:  Procedure: ESOPHAGOGASTRODUODENOSCOPY (EGD) WITH               PROPOFOL;  Surgeon: [name], MD;  Location: [facility];  Service: Gastroenterology;  Laterality: N/A; No date: HERNIA REPAIR     Comment:  MULTIPLE PERISTOMAL  06/17/2017: ILEOSCOPY; N/A     Comment:  Procedure: ILEOSCOPY THROUGH STOMA;  Surgeon: [name], MD;  Location: [facility] ENDOSCOPY;  Service:               Gastroenterology;  Laterality: N/A; No date: ILEOSTOMY; Bilateral     Comment:  MOVED X 3 No date: rectal fistula packing     Comment:  X 3 BMI    Body Mass Index:  26.49 kg/m     Reproductive/Obstetrics negative OB ROS                             Anesthesia Physical  Anesthesia Plan  ASA: III and emergent  Anesthesia Plan: General   Post-op Pain Management:    Induction: Intravenous  PONV Risk Score and Plan:   Airway Management Planned: Nasal Cannula  Additional Equipment:   Intra-op Plan:   Post-operative Plan:   Informed Consent: I have reviewed the patients History and Physical, chart, labs and  discussed the procedure including the risks, benefits and alternatives for the proposed anesthesia with the patient or authorized representative who has indicated his/her understanding and acceptance.   Dental Advisory Given  Plan Discussed with: CRNA  Anesthesia Plan Comments: ( Anemia noted.  Pt refusing blood products.  Jehovahs witness.  RB reviewed and accepted. JA)        Anesthesia Quick Evaluation

## 2017-06-23 NOTE — Progress Notes (Signed)
Return from Endoscopy

## 2017-06-23 NOTE — Progress Notes (Signed)
[place] at [place] NAME: Caroline Hughes    MR#:  [id number]  DATE OF BIRTH:  12-19-1960  SUBJECTIVE:  CHIEF COMPLAINT:   Chief Complaint  Patient presents with  . Melena   - no further melena today, s/p ileoscopy and no active crohns lesions - on soft diet, hb still low. 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  HENT: Negative for congestion, ear discharge, hearing loss and nosebleeds.   Eyes: Negative for blurred vision, double vision and photophobia.  Respiratory: Negative for cough, shortness of breath and wheezing.   Cardiovascular: Negative for chest pain, palpitations and leg swelling.  Gastrointestinal: Positive for abdominal pain, melena and nausea. Negative for constipation, diarrhea and vomiting.  Genitourinary: Negative for dysuria.  Musculoskeletal: Negative for myalgias.  Neurological: Negative for dizziness, speech change, focal weakness, seizures and headaches.  Psychiatric/Behavioral: Negative for depression.    DRUG ALLERGIES:   Allergies  Allergen Reactions  . Naproxen Other (See Comments)    Other Reaction: GI bleed  . Penicillins Swelling, Rash and Other (See Comments)    Reaction: fever Has patient had a PCN reaction causing immediate rash, facial/tongue/throat swelling, SOB or lightheadedness with hypotension: Yes Has patient had a PCN reaction causing severe rash involving mucus membranes or skin necrosis: No Has patient had a PCN reaction that required hospitalization: No Has patient had a PCN reaction occurring within the last 10 years: No If all of the above answers are "NO", then may proceed with Cephalosporin use.   . Gluten Meal     Bloating, gas, pain  . Lactose Intolerance (Gi)     Bloating, gas, pain  . Latex Itching    redness  . Other Diarrhea, Nausea  And Vomiting and Other (See Comments)    "DIGESTIVE ISSUES" Nuts  . Shellfish Allergy Other (See Comments)    Reaction: "I get stuffed up, not a true allergy"  . Soy Allergy Other (See Comments)    Reaction: digestive issues.  . Sulfonylureas Other (See Comments)    Reaction: Pt isn't sure what medication this one was associated with or what the reaction was.  . Doxycycline Rash  . Humira [Adalimumab] Rash and Other (See Comments)    Reaction: fever.  . Simvastatin Rash  . Statins Rash  . Sulfa Antibiotics Rash  . Vancomycin Rash and Other (See Comments)    Reaction: fever    VITALS:  Blood pressure 122/82, pulse (!) 101, temperature 98.2 F (36.8 C), temperature source Oral, resp. rate 17, height 5' 2"  (1.575 m), weight 65.3 kg (143 lb 14.4 oz), SpO2 100 %.  PHYSICAL EXAMINATION:  Physical Exam  GENERAL:  57 y.o.-year-old patient lying in the bed with no acute distress.  EYES: Pupils equal, round, reactive to light and accommodation. No scleral icterus. Extraocular muscles intact.  HEENT: Head atraumatic, normocephalic. Oropharynx and nasopharynx clear.  NECK:  Supple, no jugular venous distention. No thyroid enlargement, no tenderness.  LUNGS: Normal breath sounds bilaterally, no wheezing, rales,rhonchi or crepitation. No use of accessory muscles of respiration.  CARDIOVASCULAR: S1, S2 normal. No murmurs, rubs, or gallops.  ABDOMEN: Soft, but tender in left upper quadrant and around the umbilicus, nondistended. New empty Ileostomy bag in place  Good Bowel sounds present. No organomegaly or mass.  EXTREMITIES: No pedal edema, cyanosis, or clubbing.  NEUROLOGIC: Cranial nerves  II through XII are intact. Muscle strength 5/5 in all extremities. Sensation intact. Gait not checked. Global weakness noted. PSYCHIATRIC: The patient is alert and oriented x 3.  SKIN: No obvious rash, lesion, or ulcer.    LABORATORY PANEL:   CBC Recent Labs  Lab 06/23/17 0620  WBC 11.4*  HGB 5.4*   HCT 16.2*  PLT 469*   ------------------------------------------------------------------------------------------------------------------  Chemistries  Recent Labs  Lab 06/21/17 0516  NA 136  K 3.6  CL 102  CO2 22  GLUCOSE 220*  BUN 16  CREATININE 0.66  CALCIUM 8.5*   ------------------------------------------------------------------------------------------------------------------  Cardiac Enzymes No results for input(s): TROPONINI in the last 168 hours. ------------------------------------------------------------------------------------------------------------------  RADIOLOGY:  No results found.  EKG:   Orders placed or performed in visit on 12/16/14  . EKG 12-Lead  . EKG 12-Lead  . EKG 12-Lead    ASSESSMENT AND PLAN:   57 year old female with past medical history significant for ulcerative colitis status post total colectomy, ileostomy, Crohn's disease, arthritis, diabetes, GERD presents to hospital secondary to abdominal pain and melena  1. Acute anemia- secondary to GI bleed. -Patient had an oozing duodenal ulcer on EGD. S/p hemostatic clip. - However had active bleeding episode resulting in transfer to ICU. -Repeat EGD showing that the duodenal ulcer bleeding has stopped.   -GI bleeding scan is negative. Appreciate vascular consult.  - s/p ileoscopy on 06/23/17 showing no active evidence of crohns disease and no other bleeding lesions - Hemoglobin today at 5.4 - If further bleeding- consider push endoscopy or capsule endoscopy -Patient is a Jehovah's Witness-refuses blood transfusion strictly. -  -Status post epogen, IV iron. -Continue to monitor her hemoglobin, heart rate and blood pressure -Oxygen support -on soft diet now  2. Crohn's disease- appreciate GI consult - no active crohns lesions on ileosocpy -On Bentyl, Creon, Levsin and pain medications. -was on IV steroids for 5 days- GI changed to oral prednisone now - can be changed to outpatient crohns  meds- cimzia at discharge  3. Diabetes mellitus-sliding scale insulin  -  on basal insulin and adjust the dose as she is just being started on a diet -Her home dose is Levemir 45 units at bedtime.    4. Chronic abdominal pain-continue pain medications  5. DVT prophylaxis-Ted's and SCDs.  -patient is critically ill and high risk for hemorrhagic shock. - Risks of not getting the transfusion has been explained. - her  family has been updated     the records are reviewed and case discussed with Care Management/Social Worker. Management plans discussed with the patient, family and they are in agreement.  CODE STATUS: Full code  TOTAL CRITICAL CARE TIME SPENT IN TAKING CARE OF THIS PATIENT: 39 minutes.   POSSIBLE D/C IN 2 DAYS, DEPENDING ON CLINICAL CONDITION.   Gladstone Lighter M.D on 06/23/2017 at 1:26 PM  Between 7am to 6pm - Pager - [phone]  After 6pm go to [email or web address] - password [email or web address] [facility]  Office  [phone]  CC: Primary care physician; Sharyne Peach, MD

## 2017-06-23 NOTE — Op Note (Signed)
[facility] Gastroenterology Patient Name: Caroline Hughes Procedure Date: 06/23/2017 10:38 AM MRN: [id number] Account #: [id number] Date of Birth: Feb 03, 1961 Admit Type: Inpatient Age: 57 Room: [facility] ENDO ROOM 2 Gender: Female Note Status: Finalized Procedure:            Ileoscopy Indications:          History of total proctocolectomy Providers:            Lin Landsman MD, MD Referring MD:         Rubbie Battiest. Iona Beard MD, MD (Referring MD) Medicines:            Midazolam 5 mg IV, Fentanyl 150 micrograms IV Complications:        No immediate complications. Estimated blood loss: None. Procedure:            Pre-Anesthesia Assessment:                       - Prior to the procedure, a History and Physical was                        performed, and patient medications and allergies were                        reviewed. The patient is competent. The risks and                        benefits of the procedure and the sedation options and                        risks were discussed with the patient. All questions                        were answered and informed consent was obtained.                        Patient identification and proposed procedure were                        verified by the physician, the nurse and the technician                        in the pre-procedure area in the procedure room in the                        endoscopy suite. Mental Status Examination: alert and                        oriented. Airway Examination: normal oropharyngeal                        airway and neck mobility. Respiratory Examination:                        clear to auscultation. CV Examination: normal.                        Prophylactic Antibiotics: The patient does not require                        prophylactic antibiotics. Prior  Anticoagulants: The                        patient has taken no previous anticoagulant or                        antiplatelet agents. ASA Grade  Assessment: III - A                        patient with severe systemic disease. After reviewing                        the risks and benefits, the patient was deemed in                        satisfactory condition to undergo the procedure. The                        anesthesia plan was to use moderate sedation /                        analgesia (conscious sedation). Immediately prior to                        administration of medications, the patient was                        re-assessed for adequacy to receive sedatives. The                        heart rate, respiratory rate, oxygen saturations, blood                        pressure, adequacy of pulmonary ventilation, and                        response to care were monitored throughout the                        procedure. The physical status of the patient was                        re-assessed after the procedure.                       After I obtained informed consent, the scope was passed                        under direct vision. Throughout the procedure, the                        patient's blood pressure, pulse, and oxygen saturations                        were monitored continuously. The Colonoscope was                        introduced through the ileostomy and advanced to the 40                        cm into the  ileum. The ileoscopy was performed without                        difficulty. The patient tolerated the procedure well.                        The quality of the bowel preparation was adequate. Findings:      Upper endoscope was used      Ileostomy in right mid quadrant, healthy appearing stoma      The area examined upto 40 cm proximal to the stoma appeared normal.       Biopsies were taken with a cold forceps for histology.      A small amount of semi-liquid brown stool was found in the entire       examined ileum, without interference to visualization. Impression:           - The examined portion of the  ileum was normal.                        Biopsied.                       - There was no evidence of active bleeding or active                        crohn's disease                       - Liquid brown stool in the entire examined ileum. Recommendation:       - Return patient to hospital ward for ongoing care.                       - Resume regular diet today.                       - Continue present medications.                       - Resume cimzia                       - Do not recommend checking labs unless pt shown signs                        of rebleeding Procedure Code(s):    --- Professional ---                       [phone], Ileoscopy, through stoma; with biopsy, single or                        multiple Diagnosis Code(s):    --- Professional ---                       Z90.49, Acquired absence of other specified parts of                        digestive tract CPT copyright 2016 American Medical Association. All rights reserved. The codes documented in this report are preliminary and upon coder review may  be revised to meet current compliance requirements. Dr. Ulyess Mort Lin Landsman MD, MD 06/23/2017 11:41:57  AM This report has been signed electronically. Number of Addenda: 0 Note Initiated On: 06/23/2017 10:38 AM Total Procedure Duration: 0 hours 15 minutes 53 seconds       [facility]

## 2017-06-24 ENCOUNTER — Encounter: Payer: Self-pay | Admitting: Gastroenterology

## 2017-06-24 DIAGNOSIS — K269 Duodenal ulcer, unspecified as acute or chronic, without hemorrhage or perforation: Secondary | ICD-10-CM

## 2017-06-24 LAB — GLUCOSE, CAPILLARY
GLUCOSE-CAPILLARY: 237 mg/dL — AB (ref 65–99)
GLUCOSE-CAPILLARY: 295 mg/dL — AB (ref 65–99)
Glucose-Capillary: 84 mg/dL (ref 65–99)
Glucose-Capillary: 200 mg/dL — ABNORMAL HIGH (ref 65–99)

## 2017-06-24 LAB — HEMOGLOBIN: Hemoglobin: 6 g/dL — ABNORMAL LOW (ref 12.0–16.0)

## 2017-06-24 LAB — HEMATOCRIT: HCT: 18.7 % — ABNORMAL LOW (ref 35.0–47.0)

## 2017-06-24 LAB — SURGICAL PATHOLOGY

## 2017-06-24 MED ORDER — SODIUM CHLORIDE 0.9 % IV SOLN
510.0000 mg | Freq: Once | INTRAVENOUS | Status: AC
  Administered 2017-06-25: 510 mg via INTRAVENOUS
  Filled 2017-06-24: qty 17

## 2017-06-24 MED ORDER — PANTOPRAZOLE SODIUM 40 MG PO TBEC
40.0000 mg | DELAYED_RELEASE_TABLET | Freq: Two times a day (BID) | ORAL | Status: DC
  Administered 2017-06-24 – 2017-06-26 (×4): 40 mg via ORAL
  Filled 2017-06-24 (×4): qty 1

## 2017-06-24 MED ORDER — GLUCERNA SHAKE PO LIQD: 237.0000 mL | Freq: Two times a day (BID) | ORAL | Status: DC

## 2017-06-24 MED ORDER — INSULIN DETEMIR 100 UNIT/ML ~~LOC~~ SOLN
12.0000 [IU] | Freq: Every day | SUBCUTANEOUS | Status: DC
  Administered 2017-06-24 – 2017-06-25 (×2): 12 [IU] via SUBCUTANEOUS
  Filled 2017-06-24 (×3): qty 0.12

## 2017-06-24 NOTE — Progress Notes (Signed)
Inpatient Diabetes Program Recommendations  AACE/ADA: New Consensus Statement on Inpatient Glycemic Control (2015)  Target Ranges:  Prepandial:   less than 140 mg/dL      Peak postprandial:   less than 180 mg/dL (1-2 hours)      Critically ill patients:  140 - 180 mg/dL   Results for ELAINAH, RHYNE (MRN [id number]) as of 06/24/2017 09:26  Ref. Range 06/23/2017 07:56 06/23/2017 12:57 06/23/2017 21:33 06/24/2017 08:02  Glucose-Capillary Latest Ref Range: 65 - 99 mg/dL 98 61 (L) 286 (H) 84   Review of Glycemic Control  Diabetes history: DM2 Outpatient Diabetes medications: Levemir 45 units QHS Current orders for Inpatient glycemic control: Levemir 14 units QHS, Novolog 0-9 units TID with meals, Novolog 0-5 units QHS  Inpatient Diabetes Program Recommendations:  Insulin - Basal: Please consider decreasing Levemir to 12 units QHS.  NOTE: No other steroids ordered and glucose noted to be down to 61 mg/dl on 06/23/17.   Thanks, Barnie Alderman, RN, MSN, CDE Diabetes Coordinator Inpatient Diabetes Program [phone] (Team Pager from 8am to 5pm)

## 2017-06-24 NOTE — Progress Notes (Signed)
Cephas Darby, MD [address]  [place]  [place], [place] [address]  Main: [phone]  Fax: [phone] Pager: [phone]   Subjective: She feels significantly better today. Tolerating soft diet well. Her stools are brown. Hemoglobin is 6 today. She received Venofer today  Objective: Vital signs in last 24 hours: Vitals:   06/24/17 1400 06/24/17 1445 06/24/17 1500 06/24/17 1600  BP: 135/64  134/61 140/64  Pulse: (!) 107 (!) 111 (!) 113 (!) 112  Resp: 18 (!) 30 16 14   Temp:      TempSrc:      SpO2: 100% 100% 97% 94%  Weight:      Height:       Weight change: -6.1 oz (-0.173 kg)  Intake/Output Summary (Last 24 hours) at 06/24/2017 1737 Last data filed at 06/24/2017 1400 Gross per 24 hour  Intake -  Output 2120 ml  Net -2120 ml     Exam: Heart:: S1S2 present, regular, tachycardic Lungs: clear to auscultation Abdomen: soft, nontender, normal bowel sounds, ileostomy bag in right mid abdomen   Lab Results: CBC Latest Ref Rng & Units 06/24/2017 06/23/2017 06/22/2017  WBC 3.6 - 11.0 K/uL - 11.4(H) 14.0(H)  Hemoglobin 12.0 - 16.0 g/dL 6.0(L) 5.4(L) 4.9(LL)  Hematocrit 35.0 - 47.0 % 18.7(L) 16.2(L) 14.7(LL)  Platelets 150 - 440 K/uL - 469(H) 433   CMP Latest Ref Rng & Units 06/21/2017 06/20/2017 06/19/2017  Glucose 65 - 99 mg/dL 220(H) 127(H) 177(H)  BUN 6 - 20 mg/dL 16 10 13   Creatinine 0.44 - 1.00 mg/dL 0.66 0.52 0.65  Sodium 135 - 145 mmol/L 136 139 139  Potassium 3.5 - 5.1 mmol/L 3.6 3.4(L) 3.6  Chloride 101 - 111 mmol/L 102 105 110  CO2 22 - 32 mmol/L 22 23 21(L)  Calcium 8.9 - 10.3 mg/dL 8.5(L) 8.2(L) 7.9(L)  Total Protein 6.5 - 8.1 g/dL - - -  Total Bilirubin 0.3 - 1.2 mg/dL - - -  Alkaline Phos 38 - 126 U/L - - -  AST 15 - 41 U/L - - -  ALT 14 - 54 U/L - - -   Micro Results: Recent Results (from the past 240 hour(s))  MRSA PCR Screening     Status: None   Collection Time: 06/18/17  8:15 PM  Result Value Ref Range Status   MRSA by PCR NEGATIVE  NEGATIVE Final    Comment:        The GeneXpert MRSA Assay (FDA approved for NASAL specimens only), is one component of a comprehensive MRSA colonization surveillance program. It is not intended to diagnose MRSA infection nor to guide or monitor treatment for MRSA infections. Performed at [facility], [address] [place], [place] [address]    Studies/Results: No results found. Medications: I have reviewed the patient's current medications. Scheduled Meds: . baclofen  10 mg Oral BID  . darbepoetin (ARANESP) injection - NON-DIALYSIS  40 mcg Subcutaneous Q Fri-1800  . dicyclomine  10 mg Oral q morning - 10a  . insulin aspart  0-5 Units Subcutaneous QHS  . insulin aspart  0-9 Units Subcutaneous TID WC  . insulin detemir  12 Units Subcutaneous QHS  . lipase/protease/amylase  24,000 Units Oral Daily  . Milnacipran  50 mg Oral BID  . montelukast  10 mg Oral QHS  . multivitamin with minerals  1 tablet Oral Daily  . pantoprazole  40 mg Oral BID AC  . traZODone  200 mg Oral QHS   Continuous Infusions: Marland Kitchen [  START ON 06/25/2017] ferumoxytol    . iron sucrose Stopped (06/24/17 1357)   PRN Meds:.acetaminophen **OR** [DISCONTINUED] acetaminophen, albuterol, gabapentin, hydrOXYzine, hyoscyamine, LORazepam, morphine injection, [DISCONTINUED] ondansetron **OR** ondansetron (ZOFRAN) IV, oxyCODONE, promethazine   Assessment: Principal Problem:   Melena Active Problems:   Anxiety   Controlled type 2 diabetes mellitus with diabetic nephropathy, with long-term current use of insulin (HCC)   Crohn disease (HCC)   GERD (gastroesophageal reflux disease) She has been expereincing recurrent flare ups, almost every month, symptoms include lower abdominal pain, cramps, diarrhea. She was treated with prednisone in 05/2017. This is new onset of melena for her. She denies NSAID use. She has been compliant with cimzia every 4weeks. EGD during this admission revealed actively bleeding duodenal  ulcer, treated with cautery and clips. She had ongoing melena, drop in hemoglobin. Therefore underwent ileoscopy which was normal. The random ileal biopsies came back normal. She does not have active Crohn's disease based on ileoscopy. She had duodenal biopsies in 09/2012 at [facility] which did not reveal evidence of active Crohn's disease. Unclear etiology of duodenal ulcer  Plan: -Continue parenteral iron, she has normal B12 and folate levels -Feraheme tomorrow -Recommend referral to [facility] for long-term parenteral iron therapy given that she has ileostomy -I personally reviewed food intolerances with her and updated allergy list, she is tolerating regular diet well -Recommend Protonix 40 mg twice a day given history of actively bleeding duodenal ulcer -Avoid NSAIDs -I recommend to hold off on oral steroids at this time -Restart Cimzia, she is due next week -If pt rebleeds, recommend push enteroscopy +/- VCE -Check pancreatic fecal elastase -Follow-up with GI in one to 2 weeks after discharge -Anticipate DC home next 1-2 days if she continues to remain stable with active GI bleed  Will continue to follow along with you   LOS: 9 days   Rohini Vanga 06/24/2017, 5:37 PM

## 2017-06-24 NOTE — Progress Notes (Signed)
[facility] [place] NAME: Caroline Hughes    MR#:  [id number]  DATE OF BIRTH:  [birth year]  SUBJECTIVE:  CHIEF COMPLAINT:   Chief Complaint  Patient presents with  . Melena   -  s/p ileoscopy and no active crohns lesions- feels better - on soft diet, hb still low.   - hb stable  REVIEW OF SYSTEMS:  Review of Systems  Constitutional: Negative for chills, fever and malaise/fatigue.  HENT: Negative for congestion, ear discharge, hearing loss and nosebleeds.   Eyes: Negative for blurred vision, double vision and photophobia.  Respiratory: Negative for cough, shortness of breath and wheezing.   Cardiovascular: Negative for chest pain, palpitations and leg swelling.  Gastrointestinal: Positive for abdominal pain, melena and nausea. Negative for constipation, diarrhea and vomiting.  Genitourinary: Negative for dysuria.  Musculoskeletal: Negative for myalgias.  Neurological: Negative for dizziness, speech change, focal weakness, seizures and headaches.  Psychiatric/Behavioral: Negative for depression.    DRUG ALLERGIES:   Allergies  Allergen Reactions  . Naproxen Other (See Comments)    Other Reaction: GI bleed  . Penicillins Swelling, Rash and Other (See Comments)    Reaction: fever Has patient had a PCN reaction causing immediate rash, facial/tongue/throat swelling, SOB or lightheadedness with hypotension: Yes Has patient had a PCN reaction causing severe rash involving mucus membranes or skin necrosis: No Has patient had a PCN reaction that required hospitalization: No Has patient had a PCN reaction occurring within the last 10 years: No If all of the above answers are "NO", then may proceed with Cephalosporin use.   . Gluten Meal     Bloating, gas, pain  . Lactose Intolerance (Gi)     Bloating, gas, pain  . Latex Itching    redness  . Other Diarrhea, Nausea And Vomiting and Other (See Comments)    "DIGESTIVE ISSUES" Nuts    . Shellfish Allergy Other (See Comments)    Reaction: "I get stuffed up, not a true allergy"  . Soy Allergy Other (See Comments)    Reaction: digestive issues.  . Sulfonylureas Other (See Comments)    Reaction: Pt isn't sure what medication this one was associated with or what the reaction was.  . Doxycycline Rash  . Humira [Adalimumab] Rash and Other (See Comments)    Reaction: fever.  . Simvastatin Rash  . Statins Rash  . Sulfa Antibiotics Rash  . Vancomycin Rash and Other (See Comments)    Reaction: fever    VITALS:  Blood pressure 117/65, pulse (!) 113, temperature 98 F (36.7 C), temperature source Oral, resp. rate (!) 25, height 5' 2"  (1.575 m), weight 65.1 kg (143 lb 8.3 oz), SpO2 100 %.  PHYSICAL EXAMINATION:  Physical Exam  GENERAL:  57 y.o.-year-old patient lying in the bed with no acute distress.  EYES: Pupils equal, round, reactive to light and accommodation. No scleral icterus. Pale conjunctivae. Extraocular muscles intact.  HEENT: Head atraumatic, normocephalic. Oropharynx and nasopharynx clear.  NECK:  Supple, no jugular venous distention. No thyroid enlargement, no tenderness.  LUNGS: Normal breath sounds bilaterally, no wheezing, rales,rhonchi or crepitation. No use of accessory muscles of respiration.  CARDIOVASCULAR: S1, S2 normal. No murmurs, rubs, or gallops.  ABDOMEN: Soft, nontender, nondistended.  Ileostomy bag in place  Good Bowel sounds present. No organomegaly or mass.  EXTREMITIES: No pedal edema, cyanosis, or clubbing.  NEUROLOGIC: Cranial nerves II through XII are intact. Muscle strength 5/5 in all extremities. Sensation intact. Gait  not checked. Global weakness noted. PSYCHIATRIC: The patient is alert and oriented x 3.  SKIN: No obvious rash, lesion, or ulcer.    LABORATORY PANEL:   CBC Recent Labs  Lab 06/23/17 0620 06/24/17 1001  WBC 11.4*  --   HGB 5.4* 6.0*  HCT 16.2* 18.7*  PLT 469*  --     ------------------------------------------------------------------------------------------------------------------  Chemistries  Recent Labs  Lab 06/21/17 0516  NA 136  K 3.6  CL 102  CO2 22  GLUCOSE 220*  BUN 16  CREATININE 0.66  CALCIUM 8.5*   ------------------------------------------------------------------------------------------------------------------  Cardiac Enzymes No results for input(s): TROPONINI in the last 168 hours. ------------------------------------------------------------------------------------------------------------------  RADIOLOGY:  No results found.  EKG:   Orders placed or performed in visit on 12/16/14  . EKG 12-Lead  . EKG 12-Lead  . EKG 12-Lead    ASSESSMENT AND PLAN:   57 year old female with past medical history significant for ulcerative colitis status post total colectomy, ileostomy, Crohn's disease, arthritis, diabetes, GERD presents to hospital secondary to abdominal pain and melena  1. Acute anemia- secondary to GI bleed. -Patient had an oozing duodenal ulcer on EGD on admission. S/p hemostatic clip. - However had active bleeding episode resulting in transfer to ICU. -Repeat EGD showing that the duodenal ulcer bleeding has stopped.   -GI bleeding scan is negative. Appreciate vascular consult.  -Patient continued to drop hemoglobin to a level of 4.9. - s/p ileoscopy on 06/23/17 showing no active evidence of crohns disease and no other bleeding lesions - If further bleeding- consider push endoscopy or capsule endoscopy -Patient is a Jehovah's Witness-refuses blood transfusion strictly. -  -Status post epogen, IV iron. -Slowly improving. Reduce blood draws. As long as vitals are stable, no indication to repeat blood draws. If she needs hemoglobin checked, please get pediatric tubes for blood collection. -Transfer to floor today is hemoglobin is stable. Encourage getting out of bed today if vitals are stable  2. Crohn's disease-  appreciate GI consult - no active crohns lesions on ileosocpy -On Bentyl, Creon, Levsin and pain medications. -was on IV steroids for 5 days- GI changed to oral prednisone now-stop after 5 days. - can be changed to outpatient crohns meds- cimzia at discharge  3. Diabetes mellitus-sliding scale insulin  -  on basal insulin and adjust the dose as she is just being started on a diet -Her home dose is Levemir 45 units at bedtime.    4. Chronic abdominal pain-continue pain medications  5. DVT prophylaxis-Ted's and SCDs.  -patient is critically ill and high risk for hemorrhagic shock. - Risks of not getting the transfusion has been explained.  -Physical therapy to work with patient today- minimal exertion as tolerated    the records are reviewed and case discussed with Care Management/Social Worker. Management plans discussed with the patient, family and they are in agreement.  CODE STATUS: Full code  TOTAL TIME SPENT IN TAKING CARE OF THIS PATIENT: 37 minutes.   POSSIBLE D/C IN 1-2 DAYS, DEPENDING ON CLINICAL CONDITION.   Gladstone Lighter M.D on 06/24/2017 at 10:56 AM  Between 7am to 6pm - Pager - [phone]  After 6pm go to [email or web address] - password [email or web address] [facility]  Office  [phone]  CC: Primary care physician; Sharyne Peach, MD

## 2017-06-25 LAB — PANCREATIC ELASTASE, FECAL

## 2017-06-25 LAB — GLUCOSE, CAPILLARY
GLUCOSE-CAPILLARY: 101 mg/dL — AB (ref 65–99)
GLUCOSE-CAPILLARY: 289 mg/dL — AB (ref 65–99)
Glucose-Capillary: 282 mg/dL — ABNORMAL HIGH (ref 65–99)
Glucose-Capillary: 248 mg/dL — ABNORMAL HIGH (ref 65–99)

## 2017-06-25 MED ORDER — SODIUM CHLORIDE 0.9 % IV SOLN: 200.0000 mg | Freq: Once | INTRAVENOUS | Status: DC

## 2017-06-25 NOTE — Progress Notes (Signed)
[facility] at [place] NAME: Caroline Hughes    MR#:  [id number]  DATE OF BIRTH:  June 03, 1960  SUBJECTIVE:   -  s/p ileoscopy and no active crohns lesions- feels better - on soft diet, hb still low but stable  REVIEW OF SYSTEMS:  Review of Systems  Constitutional: Negative for chills, fever and malaise/fatigue.  HENT: Negative for congestion, ear discharge, hearing loss and nosebleeds.   Eyes: Negative for blurred vision, double vision and photophobia.  Respiratory: Negative for cough, shortness of breath and wheezing.   Cardiovascular: Negative for chest pain, palpitations and leg swelling.  Gastrointestinal: Positive for melena and nausea. Negative for constipation, diarrhea and vomiting.  Genitourinary: Negative for dysuria.  Musculoskeletal: Negative for myalgias.  Neurological: Negative for dizziness, speech change, focal weakness, seizures and headaches.  Psychiatric/Behavioral: Negative for depression.    DRUG ALLERGIES:   Allergies  Allergen Reactions  . Naproxen Other (See Comments)    Other Reaction: GI bleed  . Penicillins Swelling, Rash and Other (See Comments)    Reaction: fever Has patient had a PCN reaction causing immediate rash, facial/tongue/throat swelling, SOB or lightheadedness with hypotension: Yes Has patient had a PCN reaction causing severe rash involving mucus membranes or skin necrosis: No Has patient had a PCN reaction that required hospitalization: No Has patient had a PCN reaction occurring within the last 10 years: No If all of the above answers are "NO", then may proceed with Cephalosporin use.   . Gluten Meal     Bloating, gas, pain  . Lactose Intolerance (Gi)     Bloating, gas, pain  . Latex Itching    redness  . Other Diarrhea, Nausea And Vomiting and Other (See Comments)    "DIGESTIVE ISSUES" Nuts  . Shellfish Allergy Other (See Comments)    Reaction: "I get stuffed up, not a true allergy"    . Soy Allergy Other (See Comments)    Reaction: digestive issues.  . Sulfonylureas Other (See Comments)    Reaction: Pt isn't sure what medication this one was associated with or what the reaction was.  . Doxycycline Rash  . Humira [Adalimumab] Rash and Other (See Comments)    Reaction: fever.  . Simvastatin Rash  . Statins Rash  . Sulfa Antibiotics Rash  . Vancomycin Rash and Other (See Comments)    Reaction: fever    VITALS:  Blood pressure (!) 110/59, pulse (!) 103, temperature 98.1 F (36.7 C), temperature source Oral, resp. rate 18, height 5' 2"  (1.575 m), weight 65.3 kg (143 lb 15.4 oz), SpO2 99 %.  PHYSICAL EXAMINATION:  Physical Exam  GENERAL:  57 y.o.-year-old patient lying in the bed with no acute distress.  EYES: Pupils equal, round, reactive to light and accommodation. No scleral icterus. Pale conjunctivae. Extraocular muscles intact.  HEENT: Head atraumatic, normocephalic. Oropharynx and nasopharynx clear.  NECK:  Supple, no jugular venous distention. No thyroid enlargement, no tenderness.  LUNGS: Normal breath sounds bilaterally, no wheezing, rales,rhonchi or crepitation. No use of accessory muscles of respiration.  CARDIOVASCULAR: S1, S2 normal. No murmurs, rubs, or gallops.  ABDOMEN: Soft, nontender, nondistended.  Ileostomy bag in place  Good Bowel sounds present. No organomegaly or mass.  EXTREMITIES: No pedal edema, cyanosis, or clubbing.  NEUROLOGIC: Cranial nerves II through XII are intact. Muscle strength 5/5 in all extremities. Sensation intact. Gait not checked. Global weakness noted. PSYCHIATRIC: The patient is alert and oriented x 3.  SKIN: No obvious rash,  lesion, or ulcer.    LABORATORY PANEL:   CBC Recent Labs  Lab 06/23/17 0620 06/24/17 1001  WBC 11.4*  --   HGB 5.4* 6.0*  HCT 16.2* 18.7*  PLT 469*  --    ------------------------------------------------------------------------------------------------------------------  Chemistries   Recent Labs  Lab 06/21/17 0516  NA 136  K 3.6  CL 102  CO2 22  GLUCOSE 220*  BUN 16  CREATININE 0.66  CALCIUM 8.5*   ------------------------------------------------------------------------------------------------------------------  Cardiac Enzymes No results for input(s): TROPONINI in the last 168 hours. ------------------------------------------------------------------------------------------------------------------  RADIOLOGY:  No results found.  EKG:   Orders placed or performed in visit on 12/16/14  . EKG 12-Lead  . EKG 12-Lead  . EKG 12-Lead    ASSESSMENT AND PLAN:   57 year old female with past medical history significant for ulcerative colitis status post total colectomy, ileostomy, Crohn's disease, arthritis, diabetes, GERD presents to hospital secondary to abdominal pain and melena  1. Acute anemia- secondary to GI bleed. -Patient had an oozing duodenal ulcer on EGD on admission. S/p hemostatic clip. - However had active bleeding episode resulting in transfer to ICU. -Repeat EGD showing that the duodenal ulcer bleeding has stopped.   -GI bleeding scan is negative. Appreciate vascular consult.  -Patient continued to drop hemoglobin to a level of 4.9---now 6.0 - s/p ileoscopy on 06/23/17 showing no active evidence of crohns disease and no other bleeding lesions - If further bleeding- consider push endoscopy or capsule endoscopy -Patient is a Jehovah's Witness-refuses blood transfusion strictly. -  -Status post epogen, IV iron.--She will be set up at the cancer center for IV iron infusions as outpatient.  Patient is agreeable. -Slowly improving. Reduce blood draws. As long as vitals are stable, no indication to repeat blood draws.   2. Crohn's disease- appreciate GI consult - no active crohns lesions on ileosocpy -On Bentyl, Creon, Levsin and pain medications. -was on IV steroids for 5 days- GI recommends start oral prednisone. - can be changed to outpatient  crohns meds- cimzia at discharge  3. Diabetes mellitus-sliding scale insulin  -  on basal insulin and adjust the dose as she is just being started on a diet -Her home dose is Levemir 45 units at bedtime.    4. Chronic abdominal pain-continue pain medications  5. DVT prophylaxis-Ted's and SCDs.  - Risks of not getting the transfusion has been explained.  -Physical therapy to work with patient today- minimal exertion as tolerated -We will discharge the patient home if she continues to improve.  Patient is agreeable with the plan.  She has her cousin who is coming in from [place] to help her out at home.    the records are reviewed and case discussed with Care Management/Social Worker. Management plans discussed with the patient, family and they are in agreement.  CODE STATUS: Full code  TOTAL TIME SPENT IN TAKING CARE OF THIS PATIENT: 37 minutes.   POSSIBLE D/C IN 1 DAYS, DEPENDING ON CLINICAL CONDITION.   Fritzi Mandes M.D on 06/25/2017 at 9:47 AM  Between 7am to 6pm - Pager - [phone]  After 6pm go to [email or web address] - password [email or web address] [facility]  Office  [phone]  CC: Primary care physician; Sharyne Peach, MD

## 2017-06-25 NOTE — Progress Notes (Signed)
Caroline Darby, MD [address]  [place]  [place], [place] [address]  Main: [phone]  Fax: [phone] Pager: [phone]   Subjective: She feels more energetic today. Tolerating diet well. Her stools are brown.   Objective: Vital signs in last 24 hours: Vitals:   06/25/17 0500 06/25/17 0607 06/25/17 0608 06/25/17 1352  BP:   (!) 110/59 (!) 147/61  Pulse:   (!) 103 (!) 124  Resp:   18 18  Temp:   98.1 F (36.7 C) 97.9 F (36.6 C)  TempSrc:  Oral Oral Oral  SpO2:   99% 100%  Weight: 143 lb 15.4 oz (65.3 kg)     Height:       Weight change: 7.1 oz (0.2 kg)  Intake/Output Summary (Last 24 hours) at 06/25/2017 1828 Last data filed at 06/25/2017 1428 Gross per 24 hour  Intake 720 ml  Output 1050 ml  Net -330 ml     Exam: Heart:: S1S2 present, regular, tachycardic Lungs: clear to auscultation Abdomen: soft, nontender, normal bowel sounds, ileostomy bag in right mid abdomen   Lab Results: CBC Latest Ref Rng & Units 06/24/2017 06/23/2017 06/22/2017  WBC 3.6 - 11.0 K/uL - 11.4(H) 14.0(H)  Hemoglobin 12.0 - 16.0 g/dL 6.0(L) 5.4(L) 4.9(LL)  Hematocrit 35.0 - 47.0 % 18.7(L) 16.2(L) 14.7(LL)  Platelets 150 - 440 K/uL - 469(H) 433   CMP Latest Ref Rng & Units 06/21/2017 06/20/2017 06/19/2017  Glucose 65 - 99 mg/dL 220(H) 127(H) 177(H)  BUN 6 - 20 mg/dL 16 10 13   Creatinine 0.44 - 1.00 mg/dL 0.66 0.52 0.65  Sodium 135 - 145 mmol/L 136 139 139  Potassium 3.5 - 5.1 mmol/L 3.6 3.4(L) 3.6  Chloride 101 - 111 mmol/L 102 105 110  CO2 22 - 32 mmol/L 22 23 21(L)  Calcium 8.9 - 10.3 mg/dL 8.5(L) 8.2(L) 7.9(L)  Total Protein 6.5 - 8.1 g/dL - - -  Total Bilirubin 0.3 - 1.2 mg/dL - - -  Alkaline Phos 38 - 126 U/L - - -  AST 15 - 41 U/L - - -  ALT 14 - 54 U/L - - -   Micro Results: Recent Results (from the past 240 hour(s))  MRSA PCR Screening     Status: None   Collection Time: 06/18/17  8:15 PM  Result Value Ref Range Status   MRSA by PCR NEGATIVE NEGATIVE Final   Comment:        The GeneXpert MRSA Assay (FDA approved for NASAL specimens only), is one component of a comprehensive MRSA colonization surveillance program. It is not intended to diagnose MRSA infection nor to guide or monitor treatment for MRSA infections. Performed at [facility], [address], [place], [place] [address]    Studies/Results: No results found. Medications: I have reviewed the patient's current medications. Scheduled Meds: . baclofen  10 mg Oral BID  . darbepoetin (ARANESP) injection - NON-DIALYSIS  40 mcg Subcutaneous Q Fri-1800  . dicyclomine  10 mg Oral q morning - 10a  . insulin aspart  0-5 Units Subcutaneous QHS  . insulin aspart  0-9 Units Subcutaneous TID WC  . insulin detemir  12 Units Subcutaneous QHS  . lipase/protease/amylase  24,000 Units Oral Daily  . Milnacipran  50 mg Oral BID  . montelukast  10 mg Oral QHS  . multivitamin with minerals  1 tablet Oral Daily  . pantoprazole  40 mg Oral BID AC  . traZODone  200 mg Oral QHS   Continuous  Infusions:  PRN Meds:.acetaminophen **OR** [DISCONTINUED] acetaminophen, albuterol, gabapentin, hydrOXYzine, hyoscyamine, LORazepam, morphine injection, [DISCONTINUED] ondansetron **OR** ondansetron (ZOFRAN) IV, oxyCODONE, promethazine   Assessment: Principal Problem:   Melena Active Problems:   Anxiety   Controlled type 2 diabetes mellitus with diabetic nephropathy, with long-term current use of insulin (HCC)   Crohn disease (HCC)   GERD (gastroesophageal reflux disease) She has been expereincing recurrent flare ups, almost every month, symptoms include lower abdominal pain, cramps, diarrhea. She was treated with prednisone in 05/2017. This is new onset of melena for her. She denies NSAID use. She has been compliant with cimzia every 4weeks. EGD during this admission revealed actively bleeding duodenal ulcer, treated with cautery and clips. She had ongoing melena, drop in hemoglobin. Therefore  underwent ileoscopy which was normal. The random ileal biopsies came back normal. She does not have active Crohn's disease based on ileoscopy. She had duodenal biopsies in 09/2012 at [facility] which did not reveal evidence of active Crohn's disease. Unclear etiology of duodenal ulcer  Plan: -Continue parenteral iron, she has normal B12 and folate levels -Feraheme today, patient requested to have a hemoglobin checked tomorrow. -Recommend referral to [facility] for long-term parenteral iron therapy given that she has ileostomy -I personally reviewed food intolerances with her and updated allergy list, she is tolerating regular diet well -Recommend Protonix 40 mg twice a day given history of actively bleeding duodenal ulcer -Avoid NSAIDs -I recommend to hold off on oral steroids at this time -Restart Cimzia, she is due next week -If pt rebleeds, recommend push enteroscopy +/- VCE -Pancreatic fecal elastase levels came back normal. Recommend to stop pancreatic enzyme supplements as she does not have exocrine pancreatic insufficiency -Follow-up with GI in one to 2 weeks after discharge -Anticipate DC home tomorrow as she continues to remain stable without active GI bleed  Will continue to follow along with you   LOS: 10 days   Averie Meiner 06/25/2017, 6:28 PM

## 2017-06-25 NOTE — Progress Notes (Signed)
Inpatient Diabetes Program Recommendations  AACE/ADA: New Consensus Statement on Inpatient Glycemic Control (2015)  Target Ranges:  Prepandial:   less than 140 mg/dL      Peak postprandial:   less than 180 mg/dL (1-2 hours)      Critically ill patients:  140 - 180 mg/dL  Results for Caroline Hughes, Caroline Hughes (MRN [id number]) as of 06/25/2017 10:32  Ref. Range 06/24/2017 08:02 06/24/2017 11:44 06/24/2017 17:17 06/24/2017 21:39 06/25/2017 08:13  Glucose-Capillary Latest Ref Range: 65 - 99 mg/dL 84 237 (H) 295 (H) 200 (H) 101 (H)    Review of Glycemic Control  Outpatient Diabetes medications: Levemir 45 units QHS Current orders for Inpatient glycemic control:Levemir 12 units QHS,Novolog 0-9 units TID with meals, Novolog 0-5 units QHS  Inpatient Diabetes Program Recommendations:  Insulin - Meal Coverage: Post prandial glucose is consistently elevated. Please consider ordering Novolog 3 units TID with meals for meal coverage if patient eats at least 50% of meals.  Thanks, Barnie Alderman, RN, MSN, CDE Diabetes Coordinator Inpatient Diabetes Program [phone] (Team Pager from 8am to 5pm)

## 2017-06-25 NOTE — Evaluation (Signed)
Physical Therapy Evaluation Patient Details Name: Caroline Hughes MRN: [id number] DOB: July 24, 1960 Today's Date: 06/25/2017   History of Present Illness  PT admitted for GI bleed. PMH includes neuropathy, neuritis, nausea, migraine, GERD, gastric ulcer, fibromyalgia, depression, Crohn's, bronchitis, anemia.  Clinical Impression  Pt is a 57 year old female who lives in a one story home alone and is independent with use of RW for longer distance ambulation at baseline.  She has several family members who plan to stay with her at various times but she is concerned about being able to get into her bathtub.  Pt was in bed upon PT arrival and was able to perform bed mobility with use of bed rail and increased time.  Pt performed STS with RW, requiring VC's for safe handling of RW.  Pt presented with slightly decreased strength of L side compared to R during MMT and decreased sensation of bilateral feet.  Pt ambulated a total of 80 ft in rm with RW with a 2 min rest break between.  She reported feeling light headed and slightly nauseated following ambulation.  Pt presents with a flexed posture and low foot clearance indicative of fall risk.  Pt will continue to benefit from skilled PT with focus on balance, strength, safe use of AD and functional mobility.    Follow Up Recommendations Home health PT    Equipment Recommendations  Rolling walker with 5" wheels    Recommendations for Other Services       Precautions / Restrictions Precautions Precautions: Fall Restrictions Weight Bearing Restrictions: No      Mobility  Bed Mobility Overal bed mobility: Modified Independent             General bed mobility comments: able to use bedrail to perform supine to sit and requires increased time  Transfers Overall transfer level: Modified independent Equipment used: Rolling walker (2 wheeled) Transfers: Sit to/from Stand Sit to Stand: Supervision         General transfer comment: Pt able  to initiate and complete STS with RW and VC's for safe hand placement and safety.  Ambulation/Gait Ambulation/Gait assistance: Supervision Ambulation Distance (Feet): 80 Feet Assistive device: Rolling walker (2 wheeled)     Gait velocity interpretation: Below normal speed for age/gender General Gait Details: Pt presented with slightly flexed posture and low foot clearance, reporting fatigue after 30 ft.  Pt took one 2 min rest break midway through ambulation.  Stairs            Wheelchair Mobility    Modified Rankin (Stroke Patients Only)       Balance Overall balance assessment: Modified Independent                                           Pertinent Vitals/Pain Pain Assessment: No/denies pain    Home Living Family/patient expects to be discharged to:: Private residence Living Arrangements: Alone Available Help at Discharge: Family Type of Home: House Home Access: Stairs to enter Entrance Stairs-Rails: Can reach both Entrance Stairs-Number of Steps: 2 Home Layout: One level Home Equipment: Walker - 2 wheels      Prior Function Level of Independence: Independent with assistive device(s)         Comments: Uses RW for longer distances but can get groceries and cook on her own.  Pt states that she loses her balance frequently but has not  fallen.     Hand Dominance        Extremity/Trunk Assessment   Upper Extremity Assessment Upper Extremity Assessment: Generalized weakness(L UE: 3+/5, R UE: 4-/5)    Lower Extremity Assessment Lower Extremity Assessment: Overall WFL for tasks assessed(PT noted decreased strength of L LE compared to R.  Roughly L: 4-/5, R: 4/5)    Cervical / Trunk Assessment Cervical / Trunk Assessment: Normal  Communication   Communication: No difficulties  Cognition Arousal/Alertness: Awake/alert Behavior During Therapy: WFL for tasks assessed/performed Overall Cognitive Status: Within Functional Limits for  tasks assessed                                        General Comments      Exercises     Assessment/Plan    PT Assessment Patient needs continued PT services  PT Problem List Decreased strength;Decreased mobility;Decreased activity tolerance;Decreased balance;Decreased knowledge of use of DME       PT Treatment Interventions DME instruction;Therapeutic activities;Gait training;Therapeutic exercise;Stair training;Balance training;Functional mobility training;Patient/family education    PT Goals (Current goals can be found in the Care Plan section)  Acute Rehab PT Goals Patient Stated Goal: To go home and be able to get around her home without falling. PT Goal Formulation: With patient Time For Goal Achievement: 07/09/17 Potential to Achieve Goals: Good    Frequency Min 2X/week   Barriers to discharge        Co-evaluation               AM-PAC PT "6 Clicks" Daily Activity  Outcome Measure Difficulty turning over in bed (including adjusting bedclothes, sheets and blankets)?: A Little Difficulty moving from lying on back to sitting on the side of the bed? : A Little Difficulty sitting down on and standing up from a chair with arms (e.g., wheelchair, bedside commode, etc,.)?: A Little Help needed moving to and from a bed to chair (including a wheelchair)?: A Little Help needed walking in hospital room?: A Little Help needed climbing 3-5 steps with a railing? : A Little 6 Click Score: 18    End of Session Equipment Utilized During Treatment: Gait belt Activity Tolerance: Patient tolerated treatment well Patient left: in chair;with call bell/phone within reach;with chair alarm set Nurse Communication: Mobility status PT Visit Diagnosis: Unsteadiness on feet (R26.81);Muscle weakness (generalized) (M62.81)    Time: 0930-1005 PT Time Calculation (min) (ACUTE ONLY): 35 min   Charges:   PT Evaluation $PT Eval Low Complexity: 1 Low PT  Treatments $Therapeutic Activity: 8-22 mins   PT G Codes:   PT G-Codes **NOT FOR INPATIENT CLASS** Functional Assessment Tool Used: AM-PAC 6 Clicks Basic Mobility    Roxanne Gates, PT, DPT   Roxanne Gates 06/25/2017, 10:18 AM

## 2017-06-26 LAB — GLUCOSE, CAPILLARY: GLUCOSE-CAPILLARY: 134 mg/dL — AB (ref 65–99)

## 2017-06-26 LAB — HEMOGLOBIN AND HEMATOCRIT, BLOOD
HCT: 20.4 % — ABNORMAL LOW (ref 35.0–47.0)
Hemoglobin: 6.7 g/dL — ABNORMAL LOW (ref 12.0–16.0)

## 2017-06-26 LAB — FERRITIN: Ferritin: 317 ng/mL — ABNORMAL HIGH (ref 11–307)

## 2017-06-26 MED ORDER — PANTOPRAZOLE SODIUM 40 MG PO TBEC: 40.0000 mg | DELAYED_RELEASE_TABLET | Freq: Two times a day (BID) | ORAL | 0 refills | Status: AC

## 2017-06-26 NOTE — Discharge Summary (Signed)
[facility] - [place] [facility]   PATIENT NAME: Caroline Hughes    MR#:  [id number]  DATE OF BIRTH:  05/23/1960  DATE OF ADMISSION:  06/15/2017 ADMITTING PHYSICIAN: David Willis, MD  DATE OF DISCHARGE: 06/26/2017  PRIMARY CARE PHYSICIAN: George, Sionne A, MD    ADMISSION DIAGNOSIS:  Acute GI bleeding [K92.2]  DISCHARGE DIAGNOSIS:  Acute GI bleeding due to Duodenal ulcers s/p clipping Acute post hemorraghic anemia--pt is Jehovah's witness Chronic Crohn's disease  SECONDARY DIAGNOSIS:   Past Medical History:  Diagnosis Date  . Anemia    Iron deficiency IRON INFUSIONS X 5 WEEKS  . Anxiety   . Arthritis   . Asthma   . Bronchitis 2017  . Complication of anesthesia   . Crohn's disease (HCC)   . Depression   . Diabetes mellitus without complication (HCC)   . Endometriosis   . Fibromyalgia   . Gastric ulcer   . GERD (gastroesophageal reflux disease)   . Herniated disc, cervical   . Hyperlipemia   . Intestinal obstruction (HCC)   . Migraine    CHRONIC  . Nausea    CHRONIC  . Neuropathy   . Pain    BACK  . Peristomal hernia   . PONV (postoperative nausea and vomiting)   . Ulcerative colitis (HCC)     HOSPITAL COURSE:   57-year-old female with past medical history significant for ulcerative colitis status post total colectomy, ileostomy, Crohn's disease, arthritis, diabetes, GERD presents to hospital secondary to abdominal pain and melena  1. Acute anemia- secondary to GI bleed. -Patient had an oozing duodenal ulcer on EGD on admission. S/p hemostatic clip. - However had active bleeding episode resulting in transfer to ICU. -Repeat EGD showing that the duodenal ulcer bleeding has stopped.   -GI bleeding scan is negative. Appreciate vascular consult.  -Patient continued to drop hemoglobin to a level of 4.9---6.0--6.7 - s/p ileoscopy on 06/23/17 showing no active evidence of crohns disease and no other bleeding lesions -Patient is a  Jehovah's Witness-refuses blood transfusion strictly.   -Status post epogen, IV iron.--She will be set up at the cancer center for IV iron infusions as outpatient.  Patient is agreeable.  2. Crohn's disease- appreciate GI consult - no active crohns lesions on ileosocpy -On Bentyl, Creon, Levsin and pain medications. -was on IV steroids for 5 days- GI recommends start oral prednisone. - can be changed to outpatient crohns meds-pt to cont cimzia at discharge  3. Diabetes mellitus-sliding scale insulin  -  on basal insulin and adjust the dose as she is just being started on a diet  4. Chronic abdominal pain-continue pain medications  5. DVT prophylaxis-Ted's and SCDs.  - Risks of not getting the transfusion has been explained.  -HHPT And aide -We will discharge the patient home today.  Patient is agreeable with the plan.  She has her cousin who is here from [place] to help her out at home.  CONSULTS OBTAINED:  Treatment Team:  Anna, Kiran, MD  DRUG ALLERGIES:   Allergies  Allergen Reactions  . Naproxen Other (See Comments)    Other Reaction: GI bleed  . Penicillins Swelling, Rash and Other (See Comments)    Reaction: fever Has patient had a PCN reaction causing immediate rash, facial/tongue/throat swelling, SOB or lightheadedness with hypotension: Yes Has patient had a PCN reaction causing severe rash involving mucus membranes or skin necrosis: No Has patient had a PCN reaction that required hospitalization: No Has patient had a   PCN reaction occurring within the last 10 years: No If all of the above answers are "NO", then may proceed with Cephalosporin use.   . Gluten Meal     Bloating, gas, pain  . Lactose Intolerance (Gi)     Bloating, gas, pain  . Latex Itching    redness  . Other Diarrhea, Nausea And Vomiting and Other (See Comments)    "DIGESTIVE ISSUES" Nuts  . Shellfish Allergy Other (See Comments)    Reaction: "I get stuffed up, not a true allergy"   . Soy Allergy Other (See Comments)    Reaction: digestive issues.  . Sulfonylureas Other (See Comments)    Reaction: Pt isn't sure what medication this one was associated with or what the reaction was.  . Doxycycline Rash  . Humira [Adalimumab] Rash and Other (See Comments)    Reaction: fever.  . Simvastatin Rash  . Statins Rash  . Sulfa Antibiotics Rash  . Vancomycin Rash and Other (See Comments)    Reaction: fever    DISCHARGE MEDICATIONS:   Allergies as of 06/26/2017      Reactions   Naproxen Other (See Comments)   Other Reaction: GI bleed   Penicillins Swelling, Rash, Other (See Comments)   Reaction: fever Has patient had a PCN reaction causing immediate rash, facial/tongue/throat swelling, SOB or lightheadedness with hypotension: Yes Has patient had a PCN reaction causing severe rash involving mucus membranes or skin necrosis: No Has patient had a PCN reaction that required hospitalization: No Has patient had a PCN reaction occurring within the last 10 years: No If all of the above answers are "NO", then may proceed with Cephalosporin use.   Gluten Meal    Bloating, gas, pain   Lactose Intolerance (gi)    Bloating, gas, pain   Latex Itching   redness   Other Diarrhea, Nausea And Vomiting, Other (See Comments)   "DIGESTIVE ISSUES" Nuts   Shellfish Allergy Other (See Comments)   Reaction: "I get stuffed up, not a true allergy"   Soy Allergy Other (See Comments)   Reaction: digestive issues.   Sulfonylureas Other (See Comments)   Reaction: Pt isn't sure what medication this one was associated with or what the reaction was.   Doxycycline Rash   Humira [adalimumab] Rash, Other (See Comments)   Reaction: fever.   Simvastatin Rash   Statins Rash   Sulfa Antibiotics Rash   Vancomycin Rash, Other (See Comments)   Reaction: fever      Medication List    STOP taking these medications   metaxalone 800 MG tablet Commonly known as:  SKELAXIN   SUMAtriptan 50 MG  tablet Commonly known as:  IMITREX   tiZANidine 4 MG tablet Commonly known as:  ZANAFLEX   traMADol 50 MG tablet Commonly known as:  ULTRAM     TAKE these medications   albuterol 108 (90 Base) MCG/ACT inhaler Commonly known as:  PROVENTIL HFA;VENTOLIN HFA Inhale 2 puffs into the lungs every 6 (six) hours as needed for wheezing or shortness of breath.   albuterol (2.5 MG/3ML) 0.083% nebulizer solution Commonly known as:  PROVENTIL Inhale 3 mLs into the lungs every 6 (six) hours as needed for shortness of breath.   baclofen 10 MG tablet Commonly known as:  LIORESAL Take 10 mg by mouth 2 (two) times daily.   CIMZIA 2 X 200 MG Kit Generic drug:  Certolizumab Pegol Inject 400 mg into the skin every 30 (thirty) days. Once a month on the 13th     cyanocobalamin 1000 MCG/ML injection Commonly known as:  (VITAMIN B-12) Inject 1,000 mcg into the muscle every 30 (thirty) days.   dicyclomine 10 MG capsule Commonly known as:  BENTYL Take 10 mg by mouth every morning.   gabapentin 800 MG tablet Commonly known as:  NEURONTIN Take 800 mg by mouth 3 (three) times daily as needed (pain).   hydrOXYzine 25 MG tablet Commonly known as:  ATARAX/VISTARIL Take 25 mg by mouth every 8 (eight) hours as needed for anxiety.   hyoscyamine 0.125 MG tablet Commonly known as:  LEVSIN, ANASPAZ Take 0.125 mg by mouth every 4 (four) hours as needed for bladder spasms or cramping.   insulin detemir 100 UNIT/ML injection Commonly known as:  LEVEMIR Inject 0.14 mLs (14 Units total) into the skin at bedtime. What changed:  how much to take   LORazepam 0.5 MG tablet Commonly known as:  ATIVAN Take 0.5 mg by mouth at bedtime as needed for anxiety or sleep.   Milnacipran 50 MG Tabs tablet Commonly known as:  SAVELLA Take 50 mg by mouth 2 (two) times daily.   montelukast 10 MG tablet Commonly known as:  SINGULAIR Take 10 mg by mouth at bedtime.   ondansetron 8 MG disintegrating tablet Commonly  known as:  ZOFRAN-ODT Take 8 mg by mouth 2 (two) times daily as needed for nausea/vomiting.   Pancrelipase (Lip-Prot-Amyl) 25000 units Cpep Take 1 capsule by mouth daily.   pantoprazole 40 MG tablet Commonly known as:  PROTONIX Take 1 tablet (40 mg total) by mouth 2 (two) times daily before a meal.   ranitidine 150 MG capsule Commonly known as:  ZANTAC Take 150 mg by mouth daily as needed for heartburn.   sucralfate 1 GM/10ML suspension Commonly known as:  CARAFATE Take 1 g by mouth 4 (four) times daily -  with meals and at bedtime.   traZODone 100 MG tablet Commonly known as:  DESYREL Take 200 mg by mouth at bedtime.       If you experience worsening of your admission symptoms, develop shortness of breath, life threatening emergency, suicidal or homicidal thoughts you must seek medical attention immediately by calling 911 or calling your MD immediately  if symptoms less severe.  You Must read complete instructions/literature along with all the possible adverse reactions/side effects for all the Medicines you take and that have been prescribed to you. Take any new Medicines after you have completely understood and accept all the possible adverse reactions/side effects.   Please note  You were cared for by a hospitalist during your hospital stay. If you have any questions about your discharge medications or the care you received while you were in the hospital after you are discharged, you can call the unit and asked to speak with the hospitalist on call if the hospitalist that took care of you is not available. Once you are discharged, your primary care physician will handle any further medical issues. Please note that NO REFILLS for any discharge medications will be authorized once you are discharged, as it is imperative that you return to your primary care physician (or establish a relationship with a primary care physician if you do not have one) for your aftercare needs so that they  can reassess your need for medications and monitor your lab values. Today   SUBJECTIVE   Doing well  VITAL SIGNS:  Blood pressure (!) 115/59, pulse (!) 103, temperature 98.1 F (36.7 C), temperature source Oral, resp. rate 20, height 5' 2" (1.575   m), weight 65.1 kg (143 lb 8.3 oz), SpO2 97 %.  I/O:    Intake/Output Summary (Last 24 hours) at 06/26/2017 0841 Last data filed at 06/26/2017 0600 Gross per 24 hour  Intake 1080 ml  Output 1875 ml  Net -795 ml    PHYSICAL EXAMINATION:  GENERAL:  57 y.o.-year-old patient lying in the bed with no acute distress.  EYES: Pupils equal, round, reactive to light and accommodation. No scleral icterus. Extraocular muscles intact. Pallor+ HEENT: Head atraumatic, normocephalic. Oropharynx and nasopharynx clear.  NECK:  Supple, no jugular venous distention. No thyroid enlargement, no tenderness.  LUNGS: Normal breath sounds bilaterally, no wheezing, rales,rhonchi or crepitation. No use of accessory muscles of respiration.  CARDIOVASCULAR: S1, S2 normal. No murmurs, rubs, or gallops.  ABDOMEN: Soft, non-tender, non-distended. Bowel sounds present. No organomegaly or mass. Ileostomy bag in the right mid abdomen EXTREMITIES: No pedal edema, cyanosis, or clubbing.  NEUROLOGIC: Cranial nerves II through XII are intact. Muscle strength 5/5 in all extremities. Sensation intact. Gait not checked.  PSYCHIATRIC:  patient is alert and oriented x 3.  SKIN: No obvious rash, lesion, or ulcer.   DATA REVIEW:   CBC  Recent Labs  Lab 06/23/17 0620  06/26/17 0502  WBC 11.4*  --   --   HGB 5.4*   < > 6.7*  HCT 16.2*   < > 20.4*  PLT 469*  --   --    < > = values in this interval not displayed.    Chemistries  Recent Labs  Lab 06/21/17 0516  NA 136  K 3.6  CL 102  CO2 22  GLUCOSE 220*  BUN 16  CREATININE 0.66  CALCIUM 8.5*    Microbiology Results   Recent Results (from the past 240 hour(s))  MRSA PCR Screening     Status: None   Collection  Time: 06/18/17  8:15 PM  Result Value Ref Range Status   MRSA by PCR NEGATIVE NEGATIVE Final    Comment:        The GeneXpert MRSA Assay (FDA approved for NASAL specimens only), is one component of a comprehensive MRSA colonization surveillance program. It is not intended to diagnose MRSA infection nor to guide or monitor treatment for MRSA infections. Performed at [facility], [address] [place], [place] [address]     RADIOLOGY:  No results found.   Management plans discussed with the patient, family and they are in agreement.  CODE STATUS:     Code Status Orders  (From admission, onward)        Start     Ordered   06/15/17 2146  Full code  Continuous     06/15/17 2145    Code Status History    Date Active Date Inactive Code Status Order ID Comments User Context   This patient has a current code status but no historical code status.    Advance Directive Documentation     Most Recent Value  Type of Advance Directive  Healthcare Power of Attorney, Living will  Pre-existing out of facility DNR order (yellow form or pink MOST form)  No data  "MOST" Form in Place?  No data      TOTAL TIME TAKING CARE OF THIS PATIENT: 40 minutes.    Fritzi Mandes M.D on 06/26/2017 at 8:41 AM  Between 7am to 6pm - Pager - [phone] After 6pm go to [email or web address] - password [email or web address] [facility]  Office  [phone]  CC:  Primary care physician; George, Sionne A, MD     

## 2017-06-26 NOTE — Care Management Important Message (Signed)
Important Message  Patient Details  Name: Caroline Hughes MRN: [id number] Date of Birth: 1961-02-19   Medicare Important Message Given:  Yes    Beverly Sessions, RN 06/26/2017, 10:26 AM

## 2017-06-26 NOTE — Care Management Note (Signed)
Case Management Note  Patient Details  Name: ZARIN HAGMANN MRN: [id number] Date of Birth: Nov 04, 1960   Patient discharge home today.  Lives at home alone. PCP Iona Beard.  PT has assessed patient and recommends home health.  Patient agreeable to services.  Due to increased weakness and low HGB patient feels that she will meet home bound criteria.  Patient has RW and cane in the home.  Does not have a preference for home health agency.  Referral made to [facility] with [place].   Subjective/Objective:                    Action/Plan:   Expected Discharge Date:  06/26/17               Expected Discharge Plan:  [place]  In-House Referral:     Discharge planning Services  CM Consult  Post Acute Care Choice:  Home Health Choice offered to:  Patient  DME Arranged:    DME Agency:     HH Arranged:  PT, Nurse's Aide [place] Agency:  [facility]  Status of Service:  Completed, signed off  If discussed at [place] of Stay Meetings, dates discussed:    Additional Comments:  Beverly Sessions, RN 06/26/2017, 12:06 PM

## 2017-06-26 NOTE — Progress Notes (Signed)
IV and tele were removed. Discharge instructions and follow-up appointments were provided to the pt. All questions answered. The pt was taken downstairs via wheelchair by NT.

## 2017-07-10 ENCOUNTER — Other Ambulatory Visit: Payer: Self-pay | Admitting: Hematology and Oncology

## 2017-07-10 ENCOUNTER — Inpatient Hospital Stay: Payer: Medicare Other

## 2017-07-10 ENCOUNTER — Encounter: Payer: Self-pay | Admitting: Hematology and Oncology

## 2017-07-10 ENCOUNTER — Inpatient Hospital Stay: Payer: Medicare Other | Attending: Hematology and Oncology | Admitting: Hematology and Oncology

## 2017-07-10 VITALS — BP 144/83 | HR 97 | Temp 98.0°F | Resp 16 | Wt 144.0 lb

## 2017-07-10 DIAGNOSIS — K50018 Crohn's disease of small intestine with other complication: Secondary | ICD-10-CM

## 2017-07-10 DIAGNOSIS — D509 Iron deficiency anemia, unspecified: Secondary | ICD-10-CM | POA: Insufficient documentation

## 2017-07-10 DIAGNOSIS — K264 Chronic or unspecified duodenal ulcer with hemorrhage: Secondary | ICD-10-CM | POA: Diagnosis not present

## 2017-07-10 DIAGNOSIS — J45909 Unspecified asthma, uncomplicated: Secondary | ICD-10-CM | POA: Insufficient documentation

## 2017-07-10 DIAGNOSIS — R110 Nausea: Secondary | ICD-10-CM | POA: Diagnosis not present

## 2017-07-10 DIAGNOSIS — E785 Hyperlipidemia, unspecified: Secondary | ICD-10-CM | POA: Insufficient documentation

## 2017-07-10 DIAGNOSIS — Z794 Long term (current) use of insulin: Secondary | ICD-10-CM | POA: Diagnosis not present

## 2017-07-10 DIAGNOSIS — Z8711 Personal history of peptic ulcer disease: Secondary | ICD-10-CM | POA: Diagnosis not present

## 2017-07-10 DIAGNOSIS — Z79899 Other long term (current) drug therapy: Secondary | ICD-10-CM | POA: Insufficient documentation

## 2017-07-10 DIAGNOSIS — E119 Type 2 diabetes mellitus without complications: Secondary | ICD-10-CM | POA: Insufficient documentation

## 2017-07-10 DIAGNOSIS — M797 Fibromyalgia: Secondary | ICD-10-CM | POA: Insufficient documentation

## 2017-07-10 DIAGNOSIS — D500 Iron deficiency anemia secondary to blood loss (chronic): Secondary | ICD-10-CM

## 2017-07-10 DIAGNOSIS — K219 Gastro-esophageal reflux disease without esophagitis: Secondary | ICD-10-CM | POA: Diagnosis not present

## 2017-07-10 DIAGNOSIS — R42 Dizziness and giddiness: Secondary | ICD-10-CM | POA: Insufficient documentation

## 2017-07-10 DIAGNOSIS — F418 Other specified anxiety disorders: Secondary | ICD-10-CM | POA: Diagnosis not present

## 2017-07-10 DIAGNOSIS — Z87891 Personal history of nicotine dependence: Secondary | ICD-10-CM | POA: Insufficient documentation

## 2017-07-10 DIAGNOSIS — G629 Polyneuropathy, unspecified: Secondary | ICD-10-CM | POA: Diagnosis not present

## 2017-07-10 LAB — SEDIMENTATION RATE: Sed Rate: 24 mm/hr (ref 0–30)

## 2017-07-10 LAB — RETICULOCYTES
RBC.: 3.57 MIL/uL — ABNORMAL LOW (ref 3.80–5.20)
Retic Count, Absolute: 135.7 10*3/uL (ref 19.0–183.0)
Retic Ct Pct: 3.8 % — ABNORMAL HIGH (ref 0.4–3.1)

## 2017-07-10 LAB — CBC WITH DIFFERENTIAL/PLATELET
Basophils Absolute: 0 10*3/uL (ref 0–0.1)
Basophils Relative: 1 %
Eosinophils Absolute: 0.3 10*3/uL (ref 0–0.7)
Eosinophils Relative: 5 %
HCT: 36.6 % (ref 35.0–47.0)
Hemoglobin: 11.8 g/dL — ABNORMAL LOW (ref 12.0–16.0)
Lymphocytes Relative: 27 %
Lymphs Abs: 1.4 10*3/uL (ref 1.0–3.6)
MCH: 33.1 pg (ref 26.0–34.0)
MCHC: 32.2 g/dL (ref 32.0–36.0)
MCV: 102.6 fL — ABNORMAL HIGH (ref 80.0–100.0)
Monocytes Absolute: 0.4 10*3/uL (ref 0.2–0.9)
Monocytes Relative: 7 %
Neutro Abs: 3.2 10*3/uL (ref 1.4–6.5)
Neutrophils Relative %: 60 %
Platelets: 508 10*3/uL — ABNORMAL HIGH (ref 150–440)
RBC: 3.57 MIL/uL — ABNORMAL LOW (ref 3.80–5.20)
RDW: 18.5 % — ABNORMAL HIGH (ref 11.5–14.5)
WBC: 5.3 10*3/uL (ref 3.6–11.0)

## 2017-07-10 LAB — FERRITIN: Ferritin: 117 ng/mL (ref 11–307)

## 2017-07-10 LAB — IRON AND TIBC
Iron: 41 ug/dL (ref 28–170)
Saturation Ratios: 12 % (ref 10.4–31.8)
TIBC: 341 ug/dL (ref 250–450)
UIBC: 300 ug/dL

## 2017-07-10 NOTE — Progress Notes (Signed)
[facility] day:  07/10/2017  Chief Complaint: Caroline Hughes is a 57 y.o. female with Crohn's disease and iron deficiency anemia who is seen for assessment after interval hospitalization.  HPI:  The patient was last seen in the hematology clinic on 03/17/2016.  At that time, she felt better after IV iron.  She was having issues with her back.  Exam was unremakable.  CBC revealed a hematocrit of of 36.6, hemoglobin 11.5, and MCV 76.1.  Ferritin was 70.  Iron saturation was 10% with a TIBC of 436.  She was scheduled for follow-up in 4 months.  She was lost to follow-up.  Ferritin has been followed:  4 on 02/18/2016, 70 on 03/17/2016, 15 on 05/19/2016, 22 on 06/19/2017, and 317 on 06/26/2017.  She was admitted to [facility] from 06/15/2017 - 06/26/2017 with acute GI bleeding.  She presented with abdominal pain and melena.  EGD on 06/17/2017 by Dr. Vicente Males revealed blood in the entire duodenum and an oozing duodenal ulcer.  A hemostatic clip was placed.  Active bleeding episode resulted in transfer to ICU.  Repeat EGD on 06/19/2017 by Dr Vicente Males was normal.  The duodenal ulcer bleeding had stopped. Tagged RBC scan on 06/18/2017 was negative. She continued to drop her hemoglobin to 4.9 on 06/22/2017.  Ileoscopy through stoma on 06/23/2017 by Dr Marius Ditch showed no active evidence of Crohn's disease and no other bleeding lesions.  Patient is a Jehovah's Witness-refuses blood transfusion strictly. She received Venofer 200 mg IV on 06/19/2017 and 06/22/2017.  She received Feraheme 510 mg on 06/25/2017.  She received Aranesp 40 mcg on 06/19/2017.  She was scheduled to follow-up in the [place] for IV iron infusions .    Regarding her Crohn's disease, there were no active Crohns lesions on ileosocpy.  She is on Bentyl, Creon, Levsin and pain medications.  She was on IV steroids for 5 days.  GIrecommended starting oral prednisone.  She was to continue Cimzia at  discharge  Hematocrit was 20.4 and hemoglobin 6.7 on 06/26/2017.  B12 was 691 and folate 14.5 on 06/19/2017.  She states that her hemoglobin was "7.1" last week.  Symptomatically, she notes that the last 2 weeks have been rough (exhausting).  She is "slow go".  She denies any melena or hematochezia.  She is eating well.  She has some nausea.  She described being light headed and dizzy at times.  She has a follow-up with Tammi Klippel, PA at the [facility] in 07/2017.  She has been referred to the IBD clinic at [facility].   Past Medical History:  Diagnosis Date  . Anemia    Iron deficiency IRON INFUSIONS X 5 WEEKS  . Anxiety   . Arthritis   . Asthma   . Bronchitis 2017  . Complication of anesthesia   . Crohn's disease ([place])   . Depression   . Diabetes mellitus without complication ([place])   . Endometriosis   . Fibromyalgia   . Gastric ulcer   . GERD (gastroesophageal reflux disease)   . Herniated disc, cervical   . Hyperlipemia   . Intestinal obstruction ([place])   . Migraine    CHRONIC  . Nausea    CHRONIC  . Neuropathy   . Pain    BACK  . Peristomal hernia   . PONV (postoperative nausea and vomiting)   . Ulcerative colitis [facility])     Past Surgical History:  Procedure Laterality Date  . ABDOMINAL  HYSTERECTOMY    . CATARACT EXTRACTION W/PHACO Right 11/25/2016   Procedure: CATARACT EXTRACTION PHACO AND INTRAOCULAR LENS PLACEMENT (IOC);  Surgeon: Birder Robson, MD;  Location: [facility] ORS;  Service: Ophthalmology;  Laterality: Right;  [place] 00:40 AP% 17.4 CDE 6.94 Fluid pack lot # [id number]  . CATARACT EXTRACTION W/PHACO Left 12/02/2016   Procedure: CATARACT EXTRACTION PHACO AND INTRAOCULAR LENS PLACEMENT ([place]);  Surgeon: Birder Robson, MD;  Location: [facility] ORS;  Service: Ophthalmology;  Laterality: Left;  [place] 00:34 AP% 14.2 CDE 4.94 Flyuid pack lot 3 [id number]  . CHOLECYSTECTOMY    . COLON RESECTION    . ESOPHAGOGASTRODUODENOSCOPY (EGD) WITH PROPOFOL N/A 06/17/2017    Procedure: ESOPHAGOGASTRODUODENOSCOPY (EGD) WITH PROPOFOL;  Surgeon: [name], MD;  Location: [facility] ENDOSCOPY;  Service: Gastroenterology;  Laterality: N/A;  . ESOPHAGOGASTRODUODENOSCOPY (EGD) WITH PROPOFOL N/A 06/19/2017   Procedure: ESOPHAGOGASTRODUODENOSCOPY (EGD) WITH PROPOFOL;  Surgeon: [name], MD;  Location: [facility] ENDOSCOPY;  Service: Gastroenterology;  Laterality: N/A;  . HERNIA REPAIR     MULTIPLE PERISTOMAL  . ILEOSCOPY N/A 06/17/2017   Procedure: ILEOSCOPY THROUGH STOMA;  Surgeon: [name], MD;  Location: [facility] ENDOSCOPY;  Service: Gastroenterology;  Laterality: N/A;  . ILEOSCOPY N/A 06/23/2017   Procedure: ILEOSCOPY THROUGH STOMA;  Surgeon: Lin Landsman, MD;  Location: [facility] ENDOSCOPY;  Service: Gastroenterology;  Laterality: N/A;  . ILEOSTOMY Bilateral    MOVED X 3  . rectal fistula packing     X 3    Family History  Problem Relation Age of Onset  . Hypertension Father   . Hyperlipidemia Father   . Heart attack Father     Social History:  reports that she has quit smoking. She has never used smokeless tobacco. She reports that she drinks about 1.8 oz of alcohol per week. She reports that she does not use drugs.  She lives in [place].  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  The patient is accompanied by 2 friends (Holley Raring and [place]) today.  Allergies:  Allergies  Allergen Reactions  . Naproxen Other (See Comments)    Other Reaction: GI bleed  . Penicillins Swelling, Rash and Other (See Comments)    Reaction: fever Has patient had a PCN reaction causing immediate rash, facial/tongue/throat swelling, SOB or lightheadedness with hypotension: Yes Has patient had a PCN reaction causing severe rash involving mucus membranes or skin necrosis: No Has patient had a PCN reaction that required hospitalization: No Has patient had a PCN reaction occurring within the last 10 years: No If all of the above answers are "NO", then may proceed with Cephalosporin use.   . Gluten  Meal     Bloating, gas, pain  . Lactose Intolerance (Gi)     Bloating, gas, pain  . Latex Itching    redness  . Other Diarrhea, Nausea And Vomiting and Other (See Comments)    "DIGESTIVE ISSUES" Nuts  . Shellfish Allergy Other (See Comments)    Reaction: "I get stuffed up, not a true allergy"  . Soy Allergy Other (See Comments)    Reaction: digestive issues.  . Sulfonylureas Other (See Comments)    Reaction: Pt isn't sure what medication this one was associated with or what the reaction was.  . Doxycycline Rash  . Humira [Adalimumab] Rash and Other (See Comments)    Reaction: fever.  . Simvastatin Rash  . Statins Rash  . Sulfa Antibiotics Rash  . Vancomycin Rash and Other (See Comments)    Reaction: fever    Current Medications: Current Outpatient  Medications  Medication Sig Dispense Refill  . albuterol (PROVENTIL HFA;VENTOLIN HFA) 108 (90 BASE) MCG/ACT inhaler Inhale 2 puffs into the lungs every 6 (six) hours as needed for wheezing or shortness of breath.    Marland Kitchen albuterol (PROVENTIL) (2.5 MG/3ML) 0.083% nebulizer solution Inhale 3 mLs into the lungs every 6 (six) hours as needed for shortness of breath.    . baclofen (LIORESAL) 10 MG tablet Take 10 mg by mouth 2 (two) times daily.     . Certolizumab Pegol (CIMZIA) 2 X 200 MG KIT Inject 400 mg into the skin every 30 (thirty) days. Once a month on the 13th    . dicyclomine (BENTYL) 10 MG capsule Take 10 mg by mouth every morning.     . gabapentin (NEURONTIN) 800 MG tablet Take 800 mg by mouth 3 (three) times daily as needed (pain).     . hydrOXYzine (ATARAX/VISTARIL) 25 MG tablet Take 25 mg by mouth every 8 (eight) hours as needed for anxiety.     . hyoscyamine (LEVSIN, ANASPAZ) 0.125 MG tablet Take 0.125 mg by mouth every 4 (four) hours as needed for bladder spasms or cramping.     . insulin detemir (LEVEMIR) 100 UNIT/ML injection Inject 0.14 mLs (14 Units total) into the skin at bedtime. 10 mL 11  . LORazepam (ATIVAN) 0.5 MG  tablet Take 0.5 mg by mouth at bedtime as needed for anxiety or sleep.     . Milnacipran (SAVELLA) 50 MG TABS tablet Take 50 mg by mouth 2 (two) times daily.    . montelukast (SINGULAIR) 10 MG tablet Take 10 mg by mouth at bedtime.     . ondansetron (ZOFRAN-ODT) 8 MG disintegrating tablet Take 8 mg by mouth 2 (two) times daily as needed for nausea/vomiting.    . Pancrelipase, Lip-Prot-Amyl, 25000 units CPEP Take 1 capsule by mouth daily.     . pantoprazole (PROTONIX) 40 MG tablet Take 1 tablet (40 mg total) by mouth 2 (two) times daily before a meal. 60 tablet 0  . ranitidine (ZANTAC) 150 MG capsule Take 150 mg by mouth daily as needed for heartburn.    . sucralfate (CARAFATE) 1 GM/10ML suspension Take 1 g by mouth 4 (four) times daily -  with meals and at bedtime.    . traZODone (DESYREL) 100 MG tablet Take 200 mg by mouth at bedtime.     . cyanocobalamin (,VITAMIN B-12,) 1000 MCG/ML injection Inject 1,000 mcg into the muscle every 30 (thirty) days.      No current facility-administered medications for this visit.     Review of Systems:  GENERAL:  Feels "slow go".  No fevers or sweats.  Weight stable. PERFORMANCE STATUS (ECOG):  2 HEENT:  No runny nose, sore throat, mouth sores or tenderness. Lungs:  Shortness of breath with exertion.  No cough.  No hemoptysis. Cardiac:  No chest pain, palpitations, orthopnea, or PND. GI:  Eating well.  Nausea.  No vomiting, diarrhea, constipation, melena or hematochezia. GU:  No urgency, frequency, dysuria, or hematuria. Musculoskeletal:  Fibromyalgia.  Back pain.  Herniated disk in neck.  Scoliosis.  Osteopenia.  Extremities:  No pain or swelling. Skin:  No rashes or skin changes. Neuro:  Light headed at times.  No headache, numbness or weakness, balance or coordination issues. Endocrine:  No diabetes, thyroid issues, hot flashes or night sweats. Psych:  No mood changes, depression or anxiety. Pain:  Upper abdominal pain (5 out of 10). Review of  systems:  All other systems reviewed  and found to be negative.  Physical Exam: Blood pressure (!) 144/83, pulse 97, temperature 98 F (36.7 C), temperature source Tympanic, resp. rate 16, weight 144 lb (65.3 kg). GENERAL: Slightly fatigued appearing woman lying comfortably in the exam room in no acute distress. MENTAL STATUS:  Alert and oriented to person, place and time. HEAD:  Brown disheveled hair.  Normocephalic, atraumatic, face symmetric, no Cushingoid features. EYES:  Oval glasses.  Brown eyes.  Pupils equal round and reactive to light and accomodation.  No conjunctivitis or scleral icterus. ENT:  Oropharynx clear without lesion.  Poor dentition.  Tongue normal.  Mucous membranes moist.  RESPIRATORY:  Clear to auscultation without rales, wheezes or rhonchi. CARDIOVASCULAR:  Regular rate and rhythm without murmur, rub or gallop. ABDOMEN:  Soft, minimally tender without guarding or rebound tenderness. Active bowel sounds and no hepatosplenomegaly.  No masses.  Ostomy. SKIN:  No rashes, ulcers or lesions. EXTREMITIES: No edema, no skin discoloration or tenderness.  No palpable cords. LYMPH NODES: No palpable cervical, supraclavicular, axillary or inguinal adenopathy  NEUROLOGICAL: Unremarkable. PSYCH:  Appropriate.   No visits with results within 3 Day(s) from this visit.  Latest known visit with results is:  Admission on 06/15/2017, Discharged on 06/26/2017  No results displayed because visit has over 200 results.      Assessment:  Caroline Hughes is a 57 y.o. female with Crohn's disease and iron deficiency anemia.  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  Her diagnosis was changed to Crohn's disease.  She has a history of skin reaction to Humira.  She has been on Cimzia since approximately 2013.   Ileoscopy and EGD at [facility] in 09/2012 was normal.  EGD on  12/21/2014 revealed a normal esophagus and stomach and non-bleeding duodenal ulcers (largest 3 mm).  Ileoscopy at [facility] on 01/23/2014 revealed a normal study with normal biopsies of ileum.   Diet is modest.  She denies any GI or vaginal bleeding.  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  Work-up on 02/18/2016 revealed a  hematocrit of 28.6, hemoglobin 8.7, MCV 68.5, platelets 484,000, white count 7400 with an ANC of 5200.  Ferritin was 4.  Iron studies included a saturation of 2% and a TIBC of 583.  Sedimentation rate was 24.  B12 was 785.  Folate was 6.8.  She received IV iron 1.5 - 2 years ago.  She received Venofer 200 mg IV weekly x 3 (02/26/2016 - 03/10/2016).  Hematocrit has improved from 28.6 to 36.  Ferritin has improved from 4 to 70.  She was admitted to [facility] from 06/15/2017 - 06/26/2017 with acute GI bleeding.  EGD on 06/17/2017 revealed an oozing duodenal ulcer.  A hemostatic clip was placed.  Tagged RBC scan on 06/18/2017 was negative.  Ileoscopy on 06/23/2017 showed no active evidence of Crohn's disease and no other bleeding lesions.  She received Venofer 200 mg IV on 06/19/2017 and 06/22/2017.  She received Feraheme 510 mg on 06/25/2017.  She received Aranesp 40 mcg on 06/19/2017.  Hemoglobin nadir was 4.9 on 06/22/2017 and 6.7 on 06/26/2017.  Symptomatically, she feels better, but remains fatigued.  Exam is unremakable.  Plan: 1.  Labs today:  CBC with diff, ferritin, iron studies, epo level, sed rate. 2.  Discuss recent hospitalization. 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  Discuss possible Procrit and additional IV iron.  3.  Preauthorize Procrit.  4.  RTC in 1 week for labs (CBC with diff). 5.  RTC in 2 weeks for MD assessment and labs (CBC with diff, ferritin).   Nolon Stalls, MD 07/10/2017,4:01 PM

## 2017-07-13 ENCOUNTER — Telehealth: Payer: Self-pay | Admitting: *Deleted

## 2017-07-13 LAB — ERYTHROPOIETIN: Erythropoietin: 36.5 m[IU]/mL — ABNORMAL HIGH (ref 2.6–18.5)

## 2017-07-13 NOTE — Telephone Encounter (Signed)
-----   Message from Lequita Asal, MD sent at 07/11/2017  3:56 AM EDT ----- Regarding: Please call patient  She does not need iron or Procrit now.  Counts are good.  Let's still see her back in 2 weeks.  M  ----- Message ----- From: Interface, Lab In [place] Sent: 07/10/2017   5:01 PM To: Lequita Asal, MD

## 2017-07-13 NOTE — Telephone Encounter (Signed)
Called patient and LVM that her counts are good at this time.  She does not need iron or procrit at this time.  MD would like to see patient in 2 weeks. Appointments scheduled.

## 2017-07-14 ENCOUNTER — Ambulatory Visit: Payer: Medicare Other | Admitting: Gastroenterology

## 2017-07-17 ENCOUNTER — Inpatient Hospital Stay: Payer: Medicare Other

## 2017-07-20 ENCOUNTER — Telehealth: Payer: Self-pay | Admitting: Hematology and Oncology

## 2017-07-24 ENCOUNTER — Other Ambulatory Visit: Payer: Self-pay | Admitting: *Deleted

## 2017-07-24 ENCOUNTER — Ambulatory Visit: Payer: Medicare Other | Admitting: Hematology and Oncology

## 2017-07-24 ENCOUNTER — Other Ambulatory Visit: Payer: Medicare Other

## 2017-07-24 DIAGNOSIS — I10 Essential (primary) hypertension: Secondary | ICD-10-CM | POA: Insufficient documentation

## 2017-07-24 DIAGNOSIS — D500 Iron deficiency anemia secondary to blood loss (chronic): Secondary | ICD-10-CM

## 2017-07-24 DIAGNOSIS — E782 Mixed hyperlipidemia: Secondary | ICD-10-CM | POA: Insufficient documentation

## 2017-07-26 NOTE — Progress Notes (Signed)
[facility] day:  07/27/2017  Chief Complaint: Caroline Hughes is a 57 y.o. female with Crohn's disease and iron deficiency anemia who is seen for a 2 week assessment.  HPI:  The patient was last seen in the hematology clinic on 07/10/2017 for a hospital follow up visit.  At that time, patient noted that the past 2 weeks had been "rough" (exhausting). Patient complained of intermittent vertigo. Exam was stable. Patient previously lost to follow up. Additional lab work up added. We discussed Procrit and additional IV iron, as patient cannot receive blood products due to her religious beliefs.   CBC on 07/10/2017 showed a WBC of 5300 with an [place] of 3200. Hemoglobin was 11.8, hematocrit 36.6, MCV 102.6, and platelets 508,000. Reticulocyte count was 3.8%. Ferritin was 117. Iron saturation was 12% with a TIBC of 341. Erythropoietin level high at 36.5 (2.6 - 18.5). Sedimentation rate was normal at 24.   Patient was supposed to return to clinic on 07/17/2017 for repeat labs, however she did not show for her appointment. She was subsequently rescheduled for 07/24/2017, but again, she did not show for her appointment.  Patient was seen by Dr. Raul Del (pulmonology) on 07/22/2017 for an asthma flare. She was treated in the office with Solumedrol 30 mg IM.  Patient was prescribed several interventions for use at home. She was prescribed a course of Clindamycin 150 mg x 7 days. She was encouraged to use OTC Flonase, Claritin, and saline nasal spray. Additionally, patient given Hycodan and Tessalon for PRN use. Patient to continue asthma regimen using  Advair, Singulair, and Albuterol MDIs. Scheduled to follow up with Dr. Raul Del in 3 months.   Patient is scheduled to follow up with Tammi Klippel, [facility] gastroenterology) on 08/06/2017, and Dr. Marlowe Sax [facility] rheumatology) on 08/11/2017.  Symptomatically, she continues to have vertigo.  She advises that  she is not falling around. Patient is not eating that well. Patient has lost 7 pounds over the course of the last 2 weeks. Her energy is stable and she feels pretty good. Patient has no B symptoms or interval infections.   Patient complains of generalized pain rated 5/10 today in the clinic.   Past Medical History:  Diagnosis Date  . Anemia    Iron deficiency IRON INFUSIONS X 5 WEEKS  . Anxiety   . Arthritis   . Asthma   . Bronchitis 2017  . Complication of anesthesia   . Crohn's disease ([place])   . Depression   . Diabetes mellitus without complication ([place])   . Endometriosis   . Fibromyalgia   . Gastric ulcer   . GERD (gastroesophageal reflux disease)   . Herniated disc, cervical   . Hyperlipemia   . Intestinal obstruction ([place])   . Migraine    CHRONIC  . Nausea    CHRONIC  . Neuropathy   . Pain    BACK  . Peristomal hernia   . PONV (postoperative nausea and vomiting)   . Ulcerative colitis [facility])     Past Surgical History:  Procedure Laterality Date  . ABDOMINAL HYSTERECTOMY    . CATARACT EXTRACTION W/PHACO Right 11/25/2016   Procedure: CATARACT EXTRACTION PHACO AND INTRAOCULAR LENS PLACEMENT (IOC);  Surgeon: Birder Robson, MD;  Location: [facility] ORS;  Service: Ophthalmology;  Laterality: Right;  [place] 00:40 AP% 17.4 CDE 6.94 Fluid pack lot # [id number]  . CATARACT EXTRACTION W/PHACO Left 12/02/2016   Procedure: CATARACT EXTRACTION PHACO AND INTRAOCULAR LENS  PLACEMENT (IOC);  Surgeon: Birder Robson, MD;  Location: [facility] ORS;  Service: Ophthalmology;  Laterality: Left;  [place] 00:34 AP% 14.2 CDE 4.94 Flyuid pack lot 3 [id number]  . CHOLECYSTECTOMY    . COLON RESECTION    . ESOPHAGOGASTRODUODENOSCOPY (EGD) WITH PROPOFOL N/A 06/17/2017   Procedure: ESOPHAGOGASTRODUODENOSCOPY (EGD) WITH PROPOFOL;  Surgeon: [name], MD;  Location: [facility] ENDOSCOPY;  Service: Gastroenterology;  Laterality: N/A;  . ESOPHAGOGASTRODUODENOSCOPY (EGD) WITH PROPOFOL N/A 06/19/2017   Procedure:  ESOPHAGOGASTRODUODENOSCOPY (EGD) WITH PROPOFOL;  Surgeon: [name], MD;  Location: [facility] ENDOSCOPY;  Service: Gastroenterology;  Laterality: N/A;  . HERNIA REPAIR     MULTIPLE PERISTOMAL  . ILEOSCOPY N/A 06/17/2017   Procedure: ILEOSCOPY THROUGH STOMA;  Surgeon: [name], MD;  Location: [facility] ENDOSCOPY;  Service: Gastroenterology;  Laterality: N/A;  . ILEOSCOPY N/A 06/23/2017   Procedure: ILEOSCOPY THROUGH STOMA;  Surgeon: Lin Landsman, MD;  Location: [facility] ENDOSCOPY;  Service: Gastroenterology;  Laterality: N/A;  . ILEOSTOMY Bilateral    MOVED X 3  . rectal fistula packing     X 3    Family History  Problem Relation Age of Onset  . Hypertension Father   . Hyperlipidemia Father   . Heart attack Father     Social History:  reports that she has quit smoking. She has never used smokeless tobacco. She reports that she drinks about 1.8 oz of alcohol per week. She reports that she does not use drugs.  She lives in [place].  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  The patient is accompanied by 2 friends (Holley Raring and [place]) today.  Allergies:  Allergies  Allergen Reactions  . Naproxen Other (See Comments)    Other Reaction: GI bleed  . Penicillins Swelling, Rash and Other (See Comments)    Reaction: fever Has patient had a PCN reaction causing immediate rash, facial/tongue/throat swelling, SOB or lightheadedness with hypotension: Yes Has patient had a PCN reaction causing severe rash involving mucus membranes or skin necrosis: No Has patient had a PCN reaction that required hospitalization: No Has patient had a PCN reaction occurring within the last 10 years: No If all of the above answers are "NO", then may proceed with Cephalosporin use.   . Gluten Meal     Bloating, gas, pain  . Lactose Intolerance (Gi)     Bloating, gas, pain  . Latex Itching    redness  . Shellfish Allergy Other (See Comments)    Reaction: "I get stuffed up, not a true allergy"  . Soy Allergy Other (See  Comments)    Reaction: digestive issues.  . Sulfonylureas Other (See Comments)    Reaction: Pt isn't sure what medication this one was associated with or what the reaction was.  . Doxycycline Rash  . Humira [Adalimumab] Rash and Other (See Comments)    Reaction: fever.  Leonel Ramsay Other (See Comments)    Other Reaction: per allergy scratch test  . Other Diarrhea, Nausea And Vomiting and Other (See Comments)    "DIGESTIVE ISSUES" Nuts "DIGESTIVE PROBLEMS"  . Rice Other (See Comments)    Other Reaction: per allergy scratch test  . Simvastatin Rash  . Statins Rash  . Sulfa Antibiotics Rash  . Sulfasalazine Rash  . Vancomycin Rash and Other (See Comments)    Reaction: fever    Current Medications: Current Outpatient Medications  Medication Sig Dispense Refill  . ADVAIR HFA 230-21 MCG/ACT inhaler     . albuterol (PROVENTIL HFA;VENTOLIN HFA) 108 (90 BASE) MCG/ACT inhaler Inhale  2 puffs into the lungs every 6 (six) hours as needed for wheezing or shortness of breath.    Marland Kitchen albuterol (PROVENTIL) (2.5 MG/3ML) 0.083% nebulizer solution Inhale 3 mLs into the lungs every 6 (six) hours as needed for shortness of breath.    . ALPRAZolam (XANAX) 1 MG tablet Take by mouth.    . baclofen (LIORESAL) 10 MG tablet Take 10 mg by mouth 2 (two) times daily.     . benzonatate (TESSALON) 100 MG capsule Take by mouth.    . Certolizumab Pegol (CIMZIA) 2 X 200 MG KIT Inject 400 mg into the skin every 30 (thirty) days. Once a month on the 13th    . Cholecalciferol (VITAMIN D-1000 MAX ST) 1000 units tablet Take by mouth.    . clindamycin (CLEOCIN) 150 MG capsule     . cyanocobalamin (,VITAMIN B-12,) 1000 MCG/ML injection Inject 1,000 mcg into the muscle every 30 (thirty) days.     Marland Kitchen dicyclomine (BENTYL) 10 MG capsule Take 10 mg by mouth every morning.     . DULoxetine (CYMBALTA) 60 MG capsule Take by mouth.    . ezetimibe (ZETIA) 10 MG tablet Take by mouth.    . gabapentin (NEURONTIN) 800 MG tablet Take 800  mg by mouth 3 (three) times daily as needed (pain).     Marland Kitchen HYDROcodone-homatropine (HYCODAN) 5-1.5 MG/5ML syrup Take by mouth.    . hydrOXYzine (ATARAX/VISTARIL) 25 MG tablet Take 25 mg by mouth every 8 (eight) hours as needed for anxiety.     . hyoscyamine (LEVSIN, ANASPAZ) 0.125 MG tablet Take 0.125 mg by mouth every 4 (four) hours as needed for bladder spasms or cramping.     . insulin detemir (LEVEMIR) 100 UNIT/ML injection Inject 0.14 mLs (14 Units total) into the skin at bedtime. 10 mL 11  . LORazepam (ATIVAN) 0.5 MG tablet Take 0.5 mg by mouth at bedtime as needed for anxiety or sleep.     . metoCLOPramide (REGLAN) 10 MG tablet Take by mouth.    . Milnacipran (SAVELLA) 50 MG TABS tablet Take 50 mg by mouth 2 (two) times daily.    . montelukast (SINGULAIR) 10 MG tablet Take 10 mg by mouth at bedtime.     . ondansetron (ZOFRAN) 8 MG tablet Take by mouth.    . ondansetron (ZOFRAN-ODT) 8 MG disintegrating tablet Take 8 mg by mouth 2 (two) times daily as needed for nausea/vomiting.    . Pancrelipase, Lip-Prot-Amyl, 25000 units CPEP Take 1 capsule by mouth daily.     . pantoprazole (PROTONIX) 40 MG tablet Take 1 tablet (40 mg total) by mouth 2 (two) times daily before a meal. 60 tablet 0  . predniSONE (DELTASONE) 10 MG tablet     . ranitidine (ZANTAC) 150 MG capsule Take 150 mg by mouth daily as needed for heartburn.    . sucralfate (CARAFATE) 1 GM/10ML suspension Take 1 g by mouth 4 (four) times daily -  with meals and at bedtime.    . SUMAtriptan (IMITREX) 50 MG tablet Take by mouth.    Marland Kitchen tiZANidine (ZANAFLEX) 4 MG tablet Take by mouth.    . traMADol (ULTRAM) 50 MG tablet Take by mouth.    . traZODone (DESYREL) 100 MG tablet Take 200 mg by mouth at bedtime.     Marland Kitchen ULTICARE MICRO PEN NEEDLES 32G X 4 MM MISC      No current facility-administered medications for this visit.     Review of Systems:  GENERAL:  Feels "really  tired".  No fevers or sweats.  Weight is down 7 pounds.  PERFORMANCE  STATUS (ECOG):  2 HEENT:  No runny nose, sore throat, mouth sores or tenderness. Lungs:  Shortness of breath with exertion.  No cough.  No hemoptysis. Cardiac:  No chest pain, palpitations, orthopnea, or PND. GI:  Eating well.  Nausea.  No vomiting, diarrhea, constipation, melena or hematochezia. GU:  No urgency, frequency, dysuria, or hematuria. Musculoskeletal:  Fibromyalgia.  Back pain.  Herniated disk in neck.  Scoliosis.  Osteopenia.  Extremities:  No pain or swelling. Skin:  No rashes or skin changes. Neuro:  Light headed at times.  No headache, numbness or weakness, balance or coordination issues. Endocrine:  No diabetes, thyroid issues, hot flashes or night sweats. Psych:  No mood changes, depression or anxiety. Pain: 5/10 - generalized. Review of systems:  All other systems reviewed and found to be negative.  Physical Exam: Blood pressure (!) 148/94, pulse (!) 105, temperature 97.6 F (36.4 C), temperature source Tympanic, weight 137 lb 14.4 oz (62.6 kg). GENERAL: Slightly fatigued appearing woman lying comfortably in the exam room in no acute distress. MENTAL STATUS:  Alert and oriented to person, place and time. HEAD:  Brown hair.  Normocephalic, atraumatic, face symmetric, no Cushingoid features. EYES:  Oval glasses.  Brown eyes.  Pupils equal round and reactive to light and accomodation.  No conjunctivitis or scleral icterus. ENT:  Oropharynx clear without lesion.  Poor dentition.  Tongue normal.  Mucous membranes moist.  RESPIRATORY:  Clear to auscultation without rales, wheezes or rhonchi. CARDIOVASCULAR:  Regular rate and rhythm without murmur, rub or gallop. ABDOMEN:  Soft,nontender with active bowel sounds and no hepatosplenomegaly.  No masses.  Ostomy. SKIN:  No rashes, ulcers or lesions. EXTREMITIES: No edema, no skin discoloration or tenderness.  No palpable cords. LYMPH NODES: No palpable cervical, supraclavicular, axillary or inguinal adenopathy  NEUROLOGICAL:  Unremarkable. PSYCH:  Appropriate.   Appointment on 07/27/2017  Component Date Value Ref Range Status  . WBC 07/27/2017 7.4  3.6 - 11.0 K/uL Final  . RBC 07/27/2017 4.53  3.80 - 5.20 MIL/uL Final  . Hemoglobin 07/27/2017 14.5  12.0 - 16.0 g/dL Final  . HCT 07/27/2017 42.9  35.0 - 47.0 % Final  . MCV 07/27/2017 94.7  80.0 - 100.0 fL Final  . MCH 07/27/2017 32.0  26.0 - 34.0 pg Final  . MCHC 07/27/2017 33.8  32.0 - 36.0 g/dL Final  . RDW 07/27/2017 17.2* 11.5 - 14.5 % Final  . Platelets 07/27/2017 487* 150 - 440 K/uL Final  . Neutrophils Relative % 07/27/2017 69  % Final  . Neutro Abs 07/27/2017 5.1  1.4 - 6.5 K/uL Final  . Lymphocytes Relative 07/27/2017 21  % Final  . Lymphs Abs 07/27/2017 1.6  1.0 - 3.6 K/uL Final  . Monocytes Relative 07/27/2017 7  % Final  . Monocytes Absolute 07/27/2017 0.6  0.2 - 0.9 K/uL Final  . Eosinophils Relative 07/27/2017 2  % Final  . Eosinophils Absolute 07/27/2017 0.2  0 - 0.7 K/uL Final  . Basophils Relative 07/27/2017 1  % Final  . Basophils Absolute 07/27/2017 0.0  0 - 0.1 K/uL Final   Performed at [facility], [address] [place], [place] [address]  . Sed Rate 07/27/2017 6  0 - 30 mm/hr Final   Performed at [facility], [address] [place],  [address]  . Ferritin 07/27/2017 91  11 - 307 ng/mL Final   Performed at  [facility], [address], [place], [place] [address]    Assessment:  Caroline Hughes is a 57 y.o. female with Crohn's disease and iron deficiency anemia.  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  Her diagnosis was changed to Crohn's disease.  She has a history of skin reaction to Humira.  She has been on Cimzia since approximately 2013.   Ileoscopy and EGD at [facility] in 09/2012 was normal.  EGD on 12/21/2014 revealed a normal esophagus and stomach and non-bleeding duodenal  ulcers (largest 3 mm).  Ileoscopy at [facility] on 01/23/2014 revealed a normal study with normal biopsies of ileum.   Diet is modest.  She denies any GI or vaginal bleeding.  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  Work-up on 02/18/2016 revealed a  hematocrit of 28.6, hemoglobin 8.7, MCV 68.5, platelets 484,000, white count 7400 with an ANC of 5200.  Ferritin was 4.  Iron studies included a saturation of 2% and a TIBC of 583.  Sedimentation rate was 24.  B12 was 785.  Folate was 6.8.  B12 was 691 and folate 14.5 on 06/19/2017.  She received IV iron 1.5 - 2 years ago.  She received Venofer 200 mg IV weekly x 3 (02/26/2016 - 03/10/2016).  Hematocrit has improved from 28.6 to 36.    Ferritin has been followed: 4 on 02/18/2016, 70 on 03/17/2016, 15 on 05/19/2016, 22 on 06/19/2017, 317 on 06/26/2017, and 117 on 07/10/2017.  She was admitted to [facility] from 06/15/2017 - 06/26/2017 with acute GI bleeding.  EGD on 06/17/2017 revealed an oozing duodenal ulcer.  A hemostatic clip was placed.  Tagged RBC scan on 06/18/2017 was negative.  Ileoscopy on 06/23/2017 showed no active evidence of Crohn's disease and no other bleeding lesions.  She received Venofer 200 mg IV on 06/19/2017 and 06/22/2017.  She received Feraheme 510 mg on 06/25/2017.  She received Aranesp 40 mcg on 06/19/2017.  Hemoglobin nadir was 4.9 on 06/22/2017 and 6.7 on 06/26/2017.  Symptomatically, she feels fatigued.  Exam is unremakable.  Hematocrit and iron stores are normal.  Plan: 1.  Labs today:  CBC with diff, ferritin, sed rate. 2.  Discuss anemia. Hemoglobin 14.5.  No Procrit or IV iron needed.  3.  RTC in 3 months for MD assessment, labs (CBC with diff, ferritin - day before), and +/- Venofer.    Honor Loh, NP 07/27/2017,11:49 AM    I saw and evaluated the patient, participating in the key portions of the service and reviewing pertinent diagnostic studies and records.  I reviewed the nurse practitioner's note and agree with  the findings and the plan.  The assessment and plan were discussed with the patient.  A few questions were asked by the patient and answered.   Nolon Stalls, MD 07/27/2017,11:49 AM

## 2017-07-27 ENCOUNTER — Other Ambulatory Visit: Payer: Self-pay | Admitting: Hematology and Oncology

## 2017-07-27 ENCOUNTER — Inpatient Hospital Stay (HOSPITAL_BASED_OUTPATIENT_CLINIC_OR_DEPARTMENT_OTHER): Payer: Medicare Other | Admitting: Hematology and Oncology

## 2017-07-27 ENCOUNTER — Encounter: Payer: Self-pay | Admitting: Hematology and Oncology

## 2017-07-27 ENCOUNTER — Inpatient Hospital Stay: Payer: Medicare Other | Attending: Hematology and Oncology

## 2017-07-27 ENCOUNTER — Other Ambulatory Visit: Payer: Self-pay

## 2017-07-27 VITALS — BP 148/94 | HR 105 | Temp 97.6°F | Wt 137.9 lb

## 2017-07-27 DIAGNOSIS — E785 Hyperlipidemia, unspecified: Secondary | ICD-10-CM | POA: Diagnosis not present

## 2017-07-27 DIAGNOSIS — M797 Fibromyalgia: Secondary | ICD-10-CM | POA: Insufficient documentation

## 2017-07-27 DIAGNOSIS — R42 Dizziness and giddiness: Secondary | ICD-10-CM | POA: Diagnosis not present

## 2017-07-27 DIAGNOSIS — K50919 Crohn's disease, unspecified, with unspecified complications: Secondary | ICD-10-CM

## 2017-07-27 DIAGNOSIS — K469 Unspecified abdominal hernia without obstruction or gangrene: Secondary | ICD-10-CM | POA: Diagnosis not present

## 2017-07-27 DIAGNOSIS — E119 Type 2 diabetes mellitus without complications: Secondary | ICD-10-CM

## 2017-07-27 DIAGNOSIS — R634 Abnormal weight loss: Secondary | ICD-10-CM

## 2017-07-27 DIAGNOSIS — K56609 Unspecified intestinal obstruction, unspecified as to partial versus complete obstruction: Secondary | ICD-10-CM | POA: Insufficient documentation

## 2017-07-27 DIAGNOSIS — Z8711 Personal history of peptic ulcer disease: Secondary | ICD-10-CM | POA: Diagnosis not present

## 2017-07-27 DIAGNOSIS — G629 Polyneuropathy, unspecified: Secondary | ICD-10-CM

## 2017-07-27 DIAGNOSIS — Z87891 Personal history of nicotine dependence: Secondary | ICD-10-CM | POA: Diagnosis not present

## 2017-07-27 DIAGNOSIS — K5090 Crohn's disease, unspecified, without complications: Secondary | ICD-10-CM

## 2017-07-27 DIAGNOSIS — Z79899 Other long term (current) drug therapy: Secondary | ICD-10-CM | POA: Diagnosis not present

## 2017-07-27 DIAGNOSIS — K219 Gastro-esophageal reflux disease without esophagitis: Secondary | ICD-10-CM | POA: Diagnosis not present

## 2017-07-27 DIAGNOSIS — D500 Iron deficiency anemia secondary to blood loss (chronic): Secondary | ICD-10-CM

## 2017-07-27 DIAGNOSIS — J45909 Unspecified asthma, uncomplicated: Secondary | ICD-10-CM | POA: Insufficient documentation

## 2017-07-27 DIAGNOSIS — F418 Other specified anxiety disorders: Secondary | ICD-10-CM

## 2017-07-27 LAB — CBC WITH DIFFERENTIAL/PLATELET
Basophils Absolute: 0 10*3/uL (ref 0–0.1)
Basophils Relative: 1 %
Eosinophils Absolute: 0.2 10*3/uL (ref 0–0.7)
Eosinophils Relative: 2 %
HCT: 42.9 % (ref 35.0–47.0)
Hemoglobin: 14.5 g/dL (ref 12.0–16.0)
Lymphocytes Relative: 21 %
Lymphs Abs: 1.6 10*3/uL (ref 1.0–3.6)
MCH: 32 pg (ref 26.0–34.0)
MCHC: 33.8 g/dL (ref 32.0–36.0)
MCV: 94.7 fL (ref 80.0–100.0)
Monocytes Absolute: 0.6 10*3/uL (ref 0.2–0.9)
Monocytes Relative: 7 %
Neutro Abs: 5.1 10*3/uL (ref 1.4–6.5)
Neutrophils Relative %: 69 %
Platelets: 487 10*3/uL — ABNORMAL HIGH (ref 150–440)
RBC: 4.53 MIL/uL (ref 3.80–5.20)
RDW: 17.2 % — ABNORMAL HIGH (ref 11.5–14.5)
WBC: 7.4 10*3/uL (ref 3.6–11.0)

## 2017-07-27 LAB — SEDIMENTATION RATE: Sed Rate: 6 mm/hr (ref 0–30)

## 2017-07-27 LAB — FERRITIN: Ferritin: 91 ng/mL (ref 11–307)

## 2017-07-29 ENCOUNTER — Encounter: Payer: Self-pay | Admitting: Hematology and Oncology

## 2017-08-02 ENCOUNTER — Encounter: Payer: Self-pay | Admitting: Hematology and Oncology

## 2017-08-31 ENCOUNTER — Encounter: Payer: Self-pay | Admitting: Podiatry

## 2017-08-31 ENCOUNTER — Ambulatory Visit (INDEPENDENT_AMBULATORY_CARE_PROVIDER_SITE_OTHER): Payer: Medicare Other

## 2017-08-31 ENCOUNTER — Ambulatory Visit (INDEPENDENT_AMBULATORY_CARE_PROVIDER_SITE_OTHER): Payer: Medicare Other | Admitting: Podiatry

## 2017-08-31 DIAGNOSIS — M722 Plantar fascial fibromatosis: Secondary | ICD-10-CM

## 2017-08-31 NOTE — Patient Instructions (Signed)

## 2017-08-31 NOTE — Progress Notes (Signed)
Subjective:  Patient ID: Caroline Hughes, female    DOB: June 30, 1960,  MRN: [id number] HPI Chief Complaint  Patient presents with  . Foot Pain    Plantar heel right - acing x 6-9 months, not much AM pain, got better so started exercising more and now hurting a lot again, tried stretching, d/c'd gym, some dorsal foot and ankle pain  . Diabetes    Last a1c was 7.4  . New Patient (Initial Visit)    57 y.o. female presents with the above complaint.   ROS: Denies fever chills nausea vomiting muscle aches pains back pain calf pain chest pain shortness of breath and headache.  Past Medical History:  Diagnosis Date  . Anemia    Iron deficiency IRON INFUSIONS X 5 WEEKS  . Anxiety   . Arthritis   . Asthma   . Bronchitis 2017  . Complication of anesthesia   . Crohn's disease ([place])   . Depression   . Diabetes mellitus without complication ([place])   . Endometriosis   . Fibromyalgia   . Gastric ulcer   . GERD (gastroesophageal reflux disease)   . Herniated disc, cervical   . Hyperlipemia   . Intestinal obstruction ([place])   . Migraine    CHRONIC  . Nausea    CHRONIC  . Neuropathy   . Pain    BACK  . Peristomal hernia   . PONV (postoperative nausea and vomiting)   . Ulcerative colitis [facility])    Past Surgical History:  Procedure Laterality Date  . ABDOMINAL HYSTERECTOMY    . CATARACT EXTRACTION W/PHACO Right 11/25/2016   Procedure: CATARACT EXTRACTION PHACO AND INTRAOCULAR LENS PLACEMENT (IOC);  Surgeon: Birder Robson, MD;  Location: [facility] ORS;  Service: Ophthalmology;  Laterality: Right;  [place] 00:40 AP% 17.4 CDE 6.94 Fluid pack lot # [id number]  . CATARACT EXTRACTION W/PHACO Left 12/02/2016   Procedure: CATARACT EXTRACTION PHACO AND INTRAOCULAR LENS PLACEMENT ([place]);  Surgeon: Birder Robson, MD;  Location: [facility] ORS;  Service: Ophthalmology;  Laterality: Left;  [place] 00:34 AP% 14.2 CDE 4.94 Flyuid pack lot [id number]  . CHOLECYSTECTOMY    . COLON RESECTION    .  ESOPHAGOGASTRODUODENOSCOPY (EGD) WITH PROPOFOL N/A 06/17/2017   Procedure: ESOPHAGOGASTRODUODENOSCOPY (EGD) WITH PROPOFOL;  Surgeon: [name], MD;  Location: [facility] ENDOSCOPY;  Service: Gastroenterology;  Laterality: N/A;  . ESOPHAGOGASTRODUODENOSCOPY (EGD) WITH PROPOFOL N/A 06/19/2017   Procedure: ESOPHAGOGASTRODUODENOSCOPY (EGD) WITH PROPOFOL;  Surgeon: [name], MD;  Location: [facility] ENDOSCOPY;  Service: Gastroenterology;  Laterality: N/A;  . HERNIA REPAIR     MULTIPLE PERISTOMAL  . ILEOSCOPY N/A 06/17/2017   Procedure: ILEOSCOPY THROUGH STOMA;  Surgeon: [name], MD;  Location: [facility] ENDOSCOPY;  Service: Gastroenterology;  Laterality: N/A;  . ILEOSCOPY N/A 06/23/2017   Procedure: ILEOSCOPY THROUGH STOMA;  Surgeon: Lin Landsman, MD;  Location: [facility] ENDOSCOPY;  Service: Gastroenterology;  Laterality: N/A;  . ILEOSTOMY Bilateral    MOVED X 3  . rectal fistula packing     X 3    Current Outpatient Medications:  .  ALPRAZolam (XANAX) 1 MG tablet, Take by mouth., Disp: , Rfl:  .  benzonatate (TESSALON) 100 MG capsule, Take by mouth., Disp: , Rfl:  .  Vitamin D, Ergocalciferol, (DRISDOL) 50000 units CAPS capsule, Take by mouth., Disp: , Rfl:  .  ADVAIR HFA 409-73 MCG/ACT inhaler, , Disp: , Rfl:  .  albuterol (PROVENTIL HFA;VENTOLIN HFA) 108 (90 BASE) MCG/ACT inhaler, Inhale 2 puffs into the lungs every 6 (six) hours as  needed for wheezing or shortness of breath., Disp: , Rfl:  .  albuterol (PROVENTIL) (2.5 MG/3ML) 0.083% nebulizer solution, Inhale 3 mLs into the lungs every 6 (six) hours as needed for shortness of breath., Disp: , Rfl:  .  ALPRAZolam (XANAX) 1 MG tablet, Take by mouth., Disp: , Rfl:  .  baclofen (LIORESAL) 10 MG tablet, Take 10 mg by mouth 2 (two) times daily. , Disp: , Rfl:  .  Certolizumab Pegol (CIMZIA) 2 X 200 MG KIT, Inject 400 mg into the skin every 30 (thirty) days. Once a month on the 13th, Disp: , Rfl:  .  Cholecalciferol (VITAMIN D-1000 Tiera Mensinger ST) 1000 units  tablet, Take by mouth., Disp: , Rfl:  .  CIMZIA PREFILLED 2 X 200 MG/ML KIT, , Disp: , Rfl:  .  clindamycin (CLEOCIN) 150 MG capsule, , Disp: , Rfl:  .  cyanocobalamin (,VITAMIN B-12,) 1000 MCG/ML injection, Inject 1,000 mcg into the muscle every 30 (thirty) days. , Disp: , Rfl:  .  dicyclomine (BENTYL) 10 MG capsule, Take 10 mg by mouth every morning. , Disp: , Rfl:  .  DULoxetine (CYMBALTA) 60 MG capsule, Take by mouth., Disp: , Rfl:  .  ezetimibe (ZETIA) 10 MG tablet, Take by mouth., Disp: , Rfl:  .  gabapentin (NEURONTIN) 800 MG tablet, Take 800 mg by mouth 3 (three) times daily as needed (pain). , Disp: , Rfl:  .  HYDROcodone-homatropine (HYCODAN) 5-1.5 MG/5ML syrup, Take by mouth., Disp: , Rfl:  .  hydrOXYzine (ATARAX/VISTARIL) 25 MG tablet, Take 25 mg by mouth every 8 (eight) hours as needed for anxiety. , Disp: , Rfl:  .  hyoscyamine (LEVSIN, ANASPAZ) 0.125 MG tablet, Take 0.125 mg by mouth every 4 (four) hours as needed for bladder spasms or cramping. , Disp: , Rfl:  .  insulin detemir (LEVEMIR) 100 UNIT/ML injection, Inject 0.14 mLs (14 Units total) into the skin at bedtime., Disp: 10 mL, Rfl: 11 .  LORazepam (ATIVAN) 0.5 MG tablet, Take 0.5 mg by mouth at bedtime as needed for anxiety or sleep. , Disp: , Rfl:  .  Milnacipran (SAVELLA) 50 MG TABS tablet, Take 50 mg by mouth 2 (two) times daily., Disp: , Rfl:  .  montelukast (SINGULAIR) 10 MG tablet, Take 10 mg by mouth at bedtime. , Disp: , Rfl:  .  ondansetron (ZOFRAN) 8 MG tablet, Take by mouth., Disp: , Rfl:  .  ondansetron (ZOFRAN-ODT) 8 MG disintegrating tablet, Take 8 mg by mouth 2 (two) times daily as needed for nausea/vomiting., Disp: , Rfl:  .  Pancrelipase, Lip-Prot-Amyl, 25000 units CPEP, Take 1 capsule by mouth daily. , Disp: , Rfl:  .  pantoprazole (PROTONIX) 40 MG tablet, Take 1 tablet (40 mg total) by mouth 2 (two) times daily before a meal., Disp: 60 tablet, Rfl: 0 .  predniSONE (DELTASONE) 10 MG tablet, , Disp: , Rfl:    .  ranitidine (ZANTAC) 150 MG capsule, Take 150 mg by mouth daily as needed for heartburn., Disp: , Rfl:  .  sucralfate (CARAFATE) 1 GM/10ML suspension, Take 1 g by mouth 4 (four) times daily -  with meals and at bedtime., Disp: , Rfl:  .  SUMAtriptan (IMITREX) 50 MG tablet, Take by mouth., Disp: , Rfl:  .  tiZANidine (ZANAFLEX) 4 MG tablet, Take by mouth., Disp: , Rfl:  .  traMADol (ULTRAM) 50 MG tablet, Take by mouth., Disp: , Rfl:  .  traZODone (DESYREL) 100 MG tablet, Take 200 mg by mouth  at bedtime. , Disp: , Rfl:  .  ULTICARE MICRO PEN NEEDLES 32G X 4 MM MISC, , Disp: , Rfl:   Allergies  Allergen Reactions  . Naproxen Other (See Comments)    Other Reaction: GI bleed  . Penicillins Swelling, Rash and Other (See Comments)    Reaction: fever Has patient had a PCN reaction causing immediate rash, facial/tongue/throat swelling, SOB or lightheadedness with hypotension: Yes Has patient had a PCN reaction causing severe rash involving mucus membranes or skin necrosis: No Has patient had a PCN reaction that required hospitalization: No Has patient had a PCN reaction occurring within the last 10 years: No If all of the above answers are "NO", then may proceed with Cephalosporin use.   . Gluten Meal     Bloating, gas, pain  . Lactose Intolerance ([place])     Bloating, gas, pain  . Latex Itching    redness  . Soy Allergy Other (See Comments)    Reaction: digestive issues.  . Sulfonylureas Other (See Comments)    Reaction: Pt isn't sure what medication this one was associated with or what the reaction was.  . Doxycycline Rash  . Humira [Adalimumab] Rash and Other (See Comments)    Reaction: fever.  . Other Diarrhea, Nausea And Vomiting and Other (See Comments)    "DIGESTIVE ISSUES" Nuts "DIGESTIVE PROBLEMS"  . Simvastatin Rash  . Statins Rash  . Sulfa Antibiotics Rash  . Sulfasalazine Rash  . Vancomycin Rash and Other (See Comments)    Reaction: fever   Review of  Systems Objective:  There were no vitals filed for this visit.  General: Well developed, nourished, in no acute distress, alert and oriented x3   Dermatological: Skin is warm, dry and supple bilateral. Nails x 10 are well maintained; remaining integument appears unremarkable at this time. There are no open sores, no preulcerative lesions, no rash or signs of infection present.  Vascular: Dorsalis Pedis artery and Posterior Tibial artery pedal pulses are 2/4 bilateral with immedate capillary fill time. Pedal hair growth present. No varicosities and no lower extremity edema present bilateral.   Neruologic: Grossly intact via light touch bilateral. Vibratory intact via tuning fork bilateral. Protective threshold with Semmes Wienstein monofilament intact to all pedal sites bilateral. Patellar and Achilles deep tendon reflexes 2+ bilateral. No Babinski or clonus noted bilateral.   Musculoskeletal: No gross boney pedal deformities bilateral. No pain, crepitus, or limitation noted with foot and ankle range of motion bilateral. Muscular strength 5/5 in all groups tested bilateral.  She has pain on palpation medial calcaneal tubercle of the right heel.   Gait: Unassisted, Nonantalgic.    Radiographs:  Radiographs taken today demonstrate relatively normal osseous architecture with mild osteopenia.  Letter distally oriented calcaneal heel spurs with soft tissue increase in density at the plantar fascial calcaneal insertion site.  Assessment & Plan:   Assessment: Plantar fasciitis bilateral right greater than left.  Plan: Discussed etiology pathology conservative versus surgical therapies.  At this point injected her right heel with 20 mg Kenalog 5 mg Marcaine after sterile Betadine skin prep.  She tolerated procedure without complications.  May need to consider orthotics for her.  Discussed appropriate shoe gear stretching exercises ice therapy as your modifications.  Dispensed a plantar fascial brace  and I will follow-up with her in 6 weeks     Lachrisha Ziebarth T. [place], [place]

## 2017-09-02 ENCOUNTER — Telehealth: Payer: Self-pay | Admitting: Podiatry

## 2017-09-02 NOTE — Telephone Encounter (Signed)
I was calling to see what kind of running/athletic shoes Dr. Milinda Pointer recommended I get. My number is [phone].

## 2017-09-02 NOTE — Telephone Encounter (Signed)
Pt states she went to [employer] yesterday to look at the [facility] Dr. Milinda Pointer had recommended and they had different types like with stablizer and other options, and would like to know what Dr. Milinda Pointer recommended.

## 2017-09-03 NOTE — Telephone Encounter (Signed)
I returned call to patient and had to leave message to call office back

## 2017-09-23 DIAGNOSIS — K469 Unspecified abdominal hernia without obstruction or gangrene: Secondary | ICD-10-CM | POA: Insufficient documentation

## 2017-09-23 DIAGNOSIS — R1033 Periumbilical pain: Secondary | ICD-10-CM | POA: Insufficient documentation

## 2017-10-14 ENCOUNTER — Ambulatory Visit (INDEPENDENT_AMBULATORY_CARE_PROVIDER_SITE_OTHER): Payer: Medicare Other | Admitting: Podiatry

## 2017-10-14 ENCOUNTER — Encounter: Payer: Self-pay | Admitting: Podiatry

## 2017-10-14 DIAGNOSIS — M722 Plantar fascial fibromatosis: Secondary | ICD-10-CM

## 2017-10-14 NOTE — Progress Notes (Signed)
She presents today for follow-up of her plantar fasciitis right states that is about 50% improved seems to be getting better on a regular basis.  Objective: Vital signs are stable alert and oriented x3.  Pulses are palpable.  Neurologic sensorium is intact.  Degenerative flexors are intact.  Muscle strength is normal symmetrical.  She has pain on palpation medial calcaneal tubercle of the right heel.  Assessment: Resolving plantar fasciitis right.  Plan: After sterile Betadine skin prep injected another 20 mg of Kenalog 5 mg Marcaine point maximal tenderness right heel continue all conservative therapies including plantar pressure brace night splint.  Discussed appropriate shoe gear stretching exercise ice therapy sugar modifications.  Follow-up with her in 6 weeks if necessary.

## 2017-10-15 ENCOUNTER — Other Ambulatory Visit: Payer: Self-pay | Admitting: Student

## 2017-10-15 DIAGNOSIS — K5080 Crohn's disease of both small and large intestine without complications: Secondary | ICD-10-CM

## 2017-10-21 ENCOUNTER — Ambulatory Visit
Admission: RE | Admit: 2017-10-21 | Discharge: 2017-10-21 | Disposition: A | Discharge status: Home / Self Care | Payer: Medicare Other | Source: Ambulatory Visit | Attending: Student | Admitting: Student

## 2017-10-21 DIAGNOSIS — K5080 Crohn's disease of both small and large intestine without complications: Secondary | ICD-10-CM

## 2017-10-21 DIAGNOSIS — K435 Parastomal hernia without obstruction or  gangrene: Secondary | ICD-10-CM | POA: Diagnosis not present

## 2017-10-21 MED ORDER — IOPAMIDOL (ISOVUE-300) INJECTION 61%
100.0000 mL | Freq: Once | INTRAVENOUS | Status: AC | PRN
  Administered 2017-10-21: 100 mL via INTRAVENOUS

## 2017-10-26 ENCOUNTER — Other Ambulatory Visit: Payer: Self-pay

## 2017-10-26 ENCOUNTER — Inpatient Hospital Stay: Payer: Medicare Other | Attending: Hematology and Oncology

## 2017-10-26 ENCOUNTER — Other Ambulatory Visit: Payer: Self-pay | Admitting: *Deleted

## 2017-10-26 DIAGNOSIS — D509 Iron deficiency anemia, unspecified: Secondary | ICD-10-CM | POA: Insufficient documentation

## 2017-10-26 DIAGNOSIS — D500 Iron deficiency anemia secondary to blood loss (chronic): Secondary | ICD-10-CM

## 2017-10-26 LAB — CBC WITH DIFFERENTIAL/PLATELET
Basophils Absolute: 0.1 10*3/uL (ref 0–0.1)
Basophils Relative: 1 %
Eosinophils Absolute: 0.1 10*3/uL (ref 0–0.7)
Eosinophils Relative: 1 %
HCT: 40.7 % (ref 35.0–47.0)
Hemoglobin: 13.9 g/dL (ref 12.0–16.0)
Lymphocytes Relative: 25 %
Lymphs Abs: 1.5 10*3/uL (ref 1.0–3.6)
MCH: 30.8 pg (ref 26.0–34.0)
MCHC: 34.2 g/dL (ref 32.0–36.0)
MCV: 90.1 fL (ref 80.0–100.0)
Monocytes Absolute: 0.4 10*3/uL (ref 0.2–0.9)
Monocytes Relative: 7 %
Neutro Abs: 4 10*3/uL (ref 1.4–6.5)
Neutrophils Relative %: 66 %
Platelets: 395 10*3/uL (ref 150–440)
RBC: 4.52 MIL/uL (ref 3.80–5.20)
RDW: 17.5 % — ABNORMAL HIGH (ref 11.5–14.5)
WBC: 6 10*3/uL (ref 3.6–11.0)

## 2017-10-26 LAB — FERRITIN: Ferritin: 30 ng/mL (ref 11–307)

## 2017-10-27 ENCOUNTER — Inpatient Hospital Stay: Payer: Medicare Other | Admitting: Urgent Care

## 2017-10-27 ENCOUNTER — Inpatient Hospital Stay: Payer: Medicare Other

## 2017-11-11 ENCOUNTER — Other Ambulatory Visit: Payer: Self-pay | Admitting: Family Medicine

## 2017-11-11 DIAGNOSIS — G44221 Chronic tension-type headache, intractable: Secondary | ICD-10-CM

## 2017-11-11 DIAGNOSIS — R51 Headache: Principal | ICD-10-CM

## 2017-11-11 DIAGNOSIS — G8929 Other chronic pain: Secondary | ICD-10-CM

## 2017-11-24 ENCOUNTER — Ambulatory Visit
Admission: RE | Admit: 2017-11-24 | Discharge: 2017-11-24 | Disposition: A | Discharge status: Home / Self Care | Payer: Medicare Other | Source: Ambulatory Visit | Attending: Family Medicine | Admitting: Family Medicine

## 2017-11-24 DIAGNOSIS — R51 Headache: Secondary | ICD-10-CM

## 2017-11-24 DIAGNOSIS — G8929 Other chronic pain: Secondary | ICD-10-CM

## 2017-11-24 DIAGNOSIS — G44221 Chronic tension-type headache, intractable: Secondary | ICD-10-CM | POA: Insufficient documentation

## 2017-11-25 ENCOUNTER — Ambulatory Visit (INDEPENDENT_AMBULATORY_CARE_PROVIDER_SITE_OTHER): Payer: Medicare Other | Admitting: Podiatry

## 2017-11-25 ENCOUNTER — Encounter: Payer: Self-pay | Admitting: Podiatry

## 2017-11-25 DIAGNOSIS — M722 Plantar fascial fibromatosis: Secondary | ICD-10-CM | POA: Diagnosis not present

## 2017-11-25 NOTE — Progress Notes (Signed)
She presents today for follow-up of her plantar fasciitis stating that is no better the injections have not helped but may be for about 3 days and now having pain all the way up into my calf.  She states that her right calf is been painful for the past 2 to 3 months she states that it comes and goes and is tender to touch at times.  Objective: Vital signs are stable alert and oriented x3.  Pulses are palpable.  She has pain on palpation medial calcaneal tubercle of the right heel with tenderness on lateral aspect of the left calf along the peroneal tendons.  This is exquisitely tender on palpation she also has pain on palpation of the calcaneus itself.  Assessment: Cannot rule out a tear of the plantar fascia and tendinitis of the peroneal tendons.  Plan: At this point am requesting MRI for confirmation of peroneal tendon tear and plantar fasciitis and tear.  This will be a surgical consideration and I will follow-up with her in the near future.

## 2017-11-26 ENCOUNTER — Other Ambulatory Visit: Payer: Self-pay

## 2017-11-26 ENCOUNTER — Inpatient Hospital Stay: Payer: Medicare Other | Attending: Hematology and Oncology

## 2017-11-26 DIAGNOSIS — D500 Iron deficiency anemia secondary to blood loss (chronic): Secondary | ICD-10-CM

## 2017-11-26 DIAGNOSIS — D509 Iron deficiency anemia, unspecified: Secondary | ICD-10-CM | POA: Diagnosis not present

## 2017-11-26 DIAGNOSIS — Z79899 Other long term (current) drug therapy: Secondary | ICD-10-CM | POA: Insufficient documentation

## 2017-11-26 LAB — CBC WITH DIFFERENTIAL/PLATELET
Basophils Absolute: 0 10*3/uL (ref 0–0.1)
Basophils Relative: 1 %
Eosinophils Absolute: 0.2 10*3/uL (ref 0–0.7)
Eosinophils Relative: 3 %
HCT: 43.7 % (ref 35.0–47.0)
Hemoglobin: 14.8 g/dL (ref 12.0–16.0)
Lymphocytes Relative: 22 %
Lymphs Abs: 1.2 10*3/uL (ref 1.0–3.6)
MCH: 32.6 pg (ref 26.0–34.0)
MCHC: 33.9 g/dL (ref 32.0–36.0)
MCV: 96 fL (ref 80.0–100.0)
Monocytes Absolute: 0.4 10*3/uL (ref 0.2–0.9)
Monocytes Relative: 8 %
Neutro Abs: 3.6 10*3/uL (ref 1.4–6.5)
Neutrophils Relative %: 66 %
Platelets: 363 10*3/uL (ref 150–440)
RBC: 4.56 MIL/uL (ref 3.80–5.20)
RDW: 16 % — ABNORMAL HIGH (ref 11.5–14.5)
WBC: 5.4 10*3/uL (ref 3.6–11.0)

## 2017-11-26 LAB — FERRITIN: Ferritin: 26 ng/mL (ref 11–307)

## 2017-11-27 ENCOUNTER — Inpatient Hospital Stay (HOSPITAL_BASED_OUTPATIENT_CLINIC_OR_DEPARTMENT_OTHER): Payer: Medicare Other | Admitting: Hematology and Oncology

## 2017-11-27 ENCOUNTER — Encounter: Payer: Self-pay | Admitting: Hematology and Oncology

## 2017-11-27 ENCOUNTER — Inpatient Hospital Stay: Payer: Medicare Other

## 2017-11-27 VITALS — BP 127/80 | HR 92 | Temp 97.2°F | Resp 18

## 2017-11-27 VITALS — BP 140/87 | HR 97 | Temp 97.4°F | Resp 18 | Wt 144.1 lb

## 2017-11-27 DIAGNOSIS — Z79899 Other long term (current) drug therapy: Secondary | ICD-10-CM

## 2017-11-27 DIAGNOSIS — D509 Iron deficiency anemia, unspecified: Secondary | ICD-10-CM | POA: Diagnosis not present

## 2017-11-27 DIAGNOSIS — D508 Other iron deficiency anemias: Secondary | ICD-10-CM

## 2017-11-27 DIAGNOSIS — D500 Iron deficiency anemia secondary to blood loss (chronic): Secondary | ICD-10-CM

## 2017-11-27 MED ORDER — IRON SUCROSE 20 MG/ML IV SOLN
200.0000 mg | Freq: Once | INTRAVENOUS | Status: AC
  Administered 2017-11-27: 200 mg via INTRAVENOUS
  Filled 2017-11-27: qty 10

## 2017-11-27 MED ORDER — SODIUM CHLORIDE 0.9 % IV SOLN
Freq: Once | INTRAVENOUS | Status: AC
  Administered 2017-11-27: 13:00:00 via INTRAVENOUS
  Filled 2017-11-27: qty 1000

## 2017-11-27 NOTE — Progress Notes (Signed)
[facility] day:  11/27/2017  Chief Complaint: Caroline Hughes is a 57 y.o. female with Crohn's disease and iron deficiency anemia who is seen for a 4 month assessment.  HPI:  The patient was last seen in the hematology clinic on 07/27/2017.  At that time, she felt fatigued.  Exam was unremakable.  Hematocrit and iron stores were normal.  Labs on 11/26/2017 revealed a hematocrit of 43.7, hemoglobin 14.8, and MCV 96.  Ferritin was 26.  Patient was seen in follow-up consult on 11/10/2017 by her PCP.  Notes reviewed.  Patient with complaints of chronic intractable headaches that were not relieved by her prescribed Imitrex.  Patient complained of being "off balance" x3 months and having an altered gait for the last year.  Patient was sent for CT imaging of the head rule out acute pathology. She underwent CT imaging on 11/24/2017 that revealed no acute cranial abnormality.  She was referred to neurology for further work-up.  During the interim, patient remains chronically fatigued.  She notes that her bowels have been stable and denies any recent flares related to her Crohn's disease.  She has mild intermittent nausea.  She denies shortness of breath.  Patient notes that she continues to have the sensation of being "off balance".  Patient is scheduled to be seen in consult by neurology Melrose Nakayama, MD) on 12/23/2017.  Patient denies that she has experienced any B symptoms. She denies any interval infections. Patient denies bleeding; no hematochezia, melena, or gross hematuria. Patient advises that she maintains an adequate appetite. She is eating well and making conscious efforts to incorporate iron rich foods into her daily diet. Weight today is 144 lb 2 oz (65.4 kg), which compared to her last visit to the clinic, represents a 7 pound increase.    Patient planes of pain rated 8 out of 10 today in clinic.   Past Medical History:  Diagnosis Date  . Anemia    Iron  deficiency IRON INFUSIONS X 5 WEEKS  . Anxiety   . Arthritis   . Asthma   . Bronchitis 2017  . Complication of anesthesia   . Crohn's disease ([place])   . Depression   . Diabetes mellitus without complication ([place])   . Endometriosis   . Fibromyalgia   . Gastric ulcer   . GERD (gastroesophageal reflux disease)   . Herniated disc, cervical   . Hyperlipemia   . Intestinal obstruction ([place])   . Migraine    CHRONIC  . Nausea    CHRONIC  . Neuropathy   . Pain    BACK  . Peristomal hernia   . PONV (postoperative nausea and vomiting)   . Ulcerative colitis [facility])     Past Surgical History:  Procedure Laterality Date  . ABDOMINAL HYSTERECTOMY    . CATARACT EXTRACTION W/PHACO Right 11/25/2016   Procedure: CATARACT EXTRACTION PHACO AND INTRAOCULAR LENS PLACEMENT (IOC);  Surgeon: Birder Robson, MD;  Location: [facility] ORS;  Service: Ophthalmology;  Laterality: Right;  [place] 00:40 AP% 17.4 CDE 6.94 Fluid pack lot # [id number]  . CATARACT EXTRACTION W/PHACO Left 12/02/2016   Procedure: CATARACT EXTRACTION PHACO AND INTRAOCULAR LENS PLACEMENT ([place]);  Surgeon: Birder Robson, MD;  Location: [facility] ORS;  Service: Ophthalmology;  Laterality: Left;  [place] 00:34 AP% 14.2 CDE 4.94 Flyuid pack lot [id number]  . CHOLECYSTECTOMY    . COLON RESECTION    . ESOPHAGOGASTRODUODENOSCOPY (EGD) WITH PROPOFOL N/A 06/17/2017   Procedure: ESOPHAGOGASTRODUODENOSCOPY (EGD) WITH  PROPOFOL;  Surgeon: [name], MD;  Location: [facility] ENDOSCOPY;  Service: Gastroenterology;  Laterality: N/A;  . ESOPHAGOGASTRODUODENOSCOPY (EGD) WITH PROPOFOL N/A 06/19/2017   Procedure: ESOPHAGOGASTRODUODENOSCOPY (EGD) WITH PROPOFOL;  Surgeon: [name], MD;  Location: [facility] ENDOSCOPY;  Service: Gastroenterology;  Laterality: N/A;  . HERNIA REPAIR     MULTIPLE PERISTOMAL  . ILEOSCOPY N/A 06/17/2017   Procedure: ILEOSCOPY THROUGH STOMA;  Surgeon: [name], MD;  Location: [facility] ENDOSCOPY;  Service: Gastroenterology;  Laterality: N/A;  .  ILEOSCOPY N/A 06/23/2017   Procedure: ILEOSCOPY THROUGH STOMA;  Surgeon: Lin Landsman, MD;  Location: [facility] ENDOSCOPY;  Service: Gastroenterology;  Laterality: N/A;  . ILEOSTOMY Bilateral    MOVED X 3  . rectal fistula packing     X 3    Family History  Problem Relation Age of Onset  . Hypertension Father   . Hyperlipidemia Father   . Heart attack Father     Social History:  reports that she has quit smoking. She has never used smokeless tobacco. She reports that she drinks about 3.0 standard drinks of alcohol per week. She reports that she does not use drugs.  She lives in [place].  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  Friends, Holley Raring and Estill Bamberg, often attend appointments with the patient. The patient is alone today.   Allergies:  Allergies  Allergen Reactions  . Naproxen Other (See Comments)    Other Reaction: GI bleed  . Penicillins Swelling, Rash and Other (See Comments)    Reaction: fever Has patient had a PCN reaction causing immediate rash, facial/tongue/throat swelling, SOB or lightheadedness with hypotension: Yes Has patient had a PCN reaction causing severe rash involving mucus membranes or skin necrosis: No Has patient had a PCN reaction that required hospitalization: No Has patient had a PCN reaction occurring within the last 10 years: No If all of the above answers are "NO", then may proceed with Cephalosporin use.   . Gluten Meal     Bloating, gas, pain  . Lactose Intolerance (Gi)     Bloating, gas, pain  . Latex Itching    redness  . Soy Allergy Other (See Comments)    Reaction: digestive issues.  . Sulfonylureas Other (See Comments)    Reaction: Pt isn't sure what medication this one was associated with or what the reaction was.  . Doxycycline Rash  . Humira [Adalimumab] Rash and Other (See Comments)    Reaction: fever.  . Other Diarrhea, Nausea And Vomiting and Other (See Comments)    "DIGESTIVE ISSUES" Nuts "DIGESTIVE PROBLEMS"  . Simvastatin  Rash  . Statins Rash  . Sulfa Antibiotics Rash  . Sulfasalazine Rash  . Vancomycin Rash and Other (See Comments)    Reaction: fever    Current Medications: Current Outpatient Medications  Medication Sig Dispense Refill  . ADVAIR HFA 230-21 MCG/ACT inhaler     . albuterol (PROVENTIL HFA;VENTOLIN HFA) 108 (90 BASE) MCG/ACT inhaler Inhale 2 puffs into the lungs every 6 (six) hours as needed for wheezing or shortness of breath.    Marland Kitchen albuterol (PROVENTIL) (2.5 MG/3ML) 0.083% nebulizer solution Inhale 3 mLs into the lungs every 6 (six) hours as needed for shortness of breath.    . ALPRAZolam (XANAX) 1 MG tablet Take by mouth.    . ALPRAZolam (XANAX) 1 MG tablet Take by mouth.    . baclofen (LIORESAL) 10 MG tablet Take 10 mg by mouth 2 (two) times daily.     . benzonatate (TESSALON) 100 MG capsule Take  by mouth.    . Certolizumab Pegol (CIMZIA) 2 X 200 MG KIT Inject 400 mg into the skin every 30 (thirty) days. Once a month on the 13th    . Cholecalciferol (VITAMIN D-1000 MAX ST) 1000 units tablet Take by mouth.    Marland Kitchen CIMZIA PREFILLED 2 X 200 MG/ML KIT     . clindamycin (CLEOCIN) 150 MG capsule     . cyanocobalamin (,VITAMIN B-12,) 1000 MCG/ML injection Inject 1,000 mcg into the muscle every 30 (thirty) days.     Marland Kitchen desipramine (NORPRAMIN) 25 MG tablet     . dicyclomine (BENTYL) 10 MG capsule Take 10 mg by mouth every morning.     . DULoxetine (CYMBALTA) 60 MG capsule Take by mouth.    . ezetimibe (ZETIA) 10 MG tablet Take by mouth.    . gabapentin (NEURONTIN) 800 MG tablet Take 800 mg by mouth 3 (three) times daily as needed (pain).     Marland Kitchen glucose blood (PRECISION QID TEST) test strip Use once daily    . HYDROcodone-homatropine (HYCODAN) 5-1.5 MG/5ML syrup Take by mouth.    . hydrOXYzine (ATARAX/VISTARIL) 25 MG tablet Take 25 mg by mouth every 8 (eight) hours as needed for anxiety.     . hyoscyamine (LEVSIN, ANASPAZ) 0.125 MG tablet Take 0.125 mg by mouth every 4 (four) hours as needed for  bladder spasms or cramping.     . insulin detemir (LEVEMIR) 100 UNIT/ML injection Inject 0.14 mLs (14 Units total) into the skin at bedtime. 10 mL 11  . LORazepam (ATIVAN) 0.5 MG tablet Take 0.5 mg by mouth at bedtime as needed for anxiety or sleep.     . Methylsulfonylmethane (MSM) 1000 MG CAPS     . metoCLOPramide (REGLAN) 10 MG tablet Take by mouth.    . Milnacipran (SAVELLA) 50 MG TABS tablet Take 50 mg by mouth 2 (two) times daily.    . montelukast (SINGULAIR) 10 MG tablet Take 10 mg by mouth at bedtime.     Marland Kitchen omeprazole (PRILOSEC) 40 MG capsule Take by mouth.    . ondansetron (ZOFRAN) 8 MG tablet Take by mouth.    . ondansetron (ZOFRAN-ODT) 8 MG disintegrating tablet Take 8 mg by mouth 2 (two) times daily as needed for nausea/vomiting.    . Pancrelipase, Lip-Prot-Amyl, 25000 units CPEP Take 1 capsule by mouth daily.     . pantoprazole (PROTONIX) 40 MG tablet Take 1 tablet (40 mg total) by mouth 2 (two) times daily before a meal. 60 tablet 0  . predniSONE (DELTASONE) 10 MG tablet     . ranitidine (ZANTAC) 150 MG capsule Take 150 mg by mouth daily as needed for heartburn.    . sucralfate (CARAFATE) 1 GM/10ML suspension Take 1 g by mouth 4 (four) times daily -  with meals and at bedtime.    . SUMAtriptan (IMITREX) 50 MG tablet Take by mouth.    Marland Kitchen tiZANidine (ZANAFLEX) 4 MG tablet Take by mouth.    . traMADol (ULTRAM) 50 MG tablet Take by mouth.    . traZODone (DESYREL) 100 MG tablet Take 200 mg by mouth at bedtime.     Marland Kitchen ULTICARE MICRO PEN NEEDLES 32G X 4 MM MISC     . Vitamin D, Ergocalciferol, (DRISDOL) 50000 units CAPS capsule Take by mouth.     No current facility-administered medications for this visit.     Review of Systems  Constitutional: Positive for malaise/fatigue. Negative for diaphoresis, fever and weight loss (weight up 7 pounds).       "  I am just tired".  Feels "fair to middlin".  HENT: Negative.   Eyes: Negative.   Respiratory: Negative for cough, hemoptysis, sputum  production and shortness of breath (resolved).   Cardiovascular: Negative for chest pain, palpitations, orthopnea, leg swelling and PND.  Gastrointestinal: Positive for nausea (mild intermittent). Negative for abdominal pain, blood in stool, constipation, diarrhea, melena and vomiting.       Crohn's doing "ok".  Genitourinary: Negative for dysuria, frequency, hematuria and urgency.  Musculoskeletal: Positive for back pain (chronic) and joint pain (chronic pain in neck due to herniated disc). Negative for falls and myalgias.       PMH (+) for fibromyalgia, scoliosis, and osteopenia.  Skin: Negative for itching and rash.  Neurological: Positive for headaches. Negative for dizziness, tremors and weakness.       Sensation of being "off balance" x 3 months - recent head CT (-).   Endo/Heme/Allergies: Does not bruise/bleed easily.  Psychiatric/Behavioral: Negative for depression, memory loss and suicidal ideas. The patient is not nervous/anxious and does not have insomnia.   All other systems reviewed and are negative.  Performance status (ECOG): 2 - Symptomatic, <50% confined to bed  Vital Signs BP 140/87 (BP Location: Left Arm, Patient Position: Sitting)   Pulse 97   Temp (!) 97.4 F (36.3 C) (Tympanic)   Resp 18   Wt 144 lb 2 oz (65.4 kg)   BMI 26.36 kg/m   Physical Exam  Constitutional: She is oriented to person, place, and time and well-developed, well-nourished, and in no distress.  HENT:  Head: Normocephalic and atraumatic.  Mouth/Throat: Abnormal dentition (poor dentition).  Brown hair.  Eyes: Pupils are equal, round, and reactive to light. EOM are normal. No scleral icterus.  Brown eyes.   Neck: Normal range of motion. Neck supple. No tracheal deviation present. No thyromegaly present.  Cardiovascular: Normal rate, regular rhythm and normal heart sounds. Exam reveals no gallop and no friction rub.  No murmur heard. Pulmonary/Chest: Effort normal and breath sounds normal. No  respiratory distress. She has no wheezes. She has no rales.  Abdominal: Soft. Bowel sounds are normal. She exhibits no distension. There is no tenderness.  Musculoskeletal: Normal range of motion. She exhibits no edema or tenderness.  Neurological: She is alert and oriented to person, place, and time.  Skin: Skin is warm and dry. No rash noted. No erythema.  Psychiatric: Mood, affect and judgment normal.  Nursing note and vitals reviewed.   Orders Only on 11/26/2017  Component Date Value Ref Range Status  . Ferritin 11/26/2017 26  11 - 307 ng/mL Final   Performed at [facility], [address] [place],  [address]  . WBC 11/26/2017 5.4  3.6 - 11.0 K/uL Final  . RBC 11/26/2017 4.56  3.80 - 5.20 MIL/uL Final  . Hemoglobin 11/26/2017 14.8  12.0 - 16.0 g/dL Final  . HCT 11/26/2017 43.7  35.0 - 47.0 % Final  . MCV 11/26/2017 96.0  80.0 - 100.0 fL Final  . MCH 11/26/2017 32.6  26.0 - 34.0 pg Final  . MCHC 11/26/2017 33.9  32.0 - 36.0 g/dL Final  . RDW 11/26/2017 16.0* 11.5 - 14.5 % Final  . Platelets 11/26/2017 363  150 - 440 K/uL Final  . Neutrophils Relative % 11/26/2017 66  % Final  . Neutro Abs 11/26/2017 3.6  1.4 - 6.5 K/uL Final  . Lymphocytes Relative 11/26/2017 22  % Final  . Lymphs Abs 11/26/2017 1.2  1.0 - 3.6 K/uL Final  .  Monocytes Relative 11/26/2017 8  % Final  . Monocytes Absolute 11/26/2017 0.4  0.2 - 0.9 K/uL Final  . Eosinophils Relative 11/26/2017 3  % Final  . Eosinophils Absolute 11/26/2017 0.2  0 - 0.7 K/uL Final  . Basophils Relative 11/26/2017 1  % Final  . Basophils Absolute 11/26/2017 0.0  0 - 0.1 K/uL Final   Performed at [facility], [address], [place],  [address]    Assessment:  SHAHD OCCHIPINTI is a 57 y.o. female with Crohn's disease and iron deficiency anemia.  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  Her diagnosis was changed to Crohn's disease.  She has a history of skin reaction to Humira.  She has been on Cimzia since approximately 2013.   Ileoscopy and EGD at [facility] in 09/2012 was normal.  EGD on 12/21/2014 revealed a normal esophagus and stomach and non-bleeding duodenal ulcers (largest 3 mm).  Ileoscopy at [facility] on 01/23/2014 revealed a normal study with normal biopsies of ileum.   Diet is modest.  She denies any GI or vaginal bleeding.  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  Work-up on 02/18/2016 revealed a  hematocrit of 28.6, hemoglobin 8.7, MCV 68.5, platelets 484,000, white count 7400 with an ANC of 5200.  Ferritin was 4.  Iron studies included a saturation of 2% and a TIBC of 583.  Sedimentation rate was 24.  B12 was 785.  Folate was 6.8.  B12 was 691 and folate 14.5 on 06/19/2017.  She received IV iron 1.5 - 2 years ago.  She received Venofer 200 mg IV weekly x 3 (02/26/2016 - 03/10/2016).  She received Feraheme 510 mg on 06/25/2017.    Ferritin has been followed: 4 on 02/18/2016, 70 on 03/17/2016, 15 on 05/19/2016, 22 on 06/19/2017, 317 on 06/26/2017, 117 on 07/10/2017, 91 on 07/27/2017, 30 on 10/26/2017, and 26 on 11/26/2017.  Ferritin goal is 100.  She was admitted to [facility] from 06/15/2017 - 06/26/2017 with acute GI bleeding.  EGD on 06/17/2017 revealed an oozing duodenal ulcer.  A hemostatic clip was placed.  Tagged RBC scan on 06/18/2017 was negative.  Ileoscopy on 06/23/2017 showed no active evidence of Crohn's disease and no other bleeding lesions.  She received Venofer 200 mg IV on 06/19/2017 and 06/22/2017.  She received Aranesp 40 mcg on 06/19/2017.  Hemoglobin nadir was 4.9 on 06/22/2017 and 6.7 on 06/26/2017.  Symptomatically, patient remains chronically fatigued.  She notes the sensation of being "off balance" for the last 3 months.  She has had a recent head CT that resulted as negative.  Patient has been referred to neurology for further assessment.   Exam is grossly unremarkable.  WBC 5400 ([place] 3600).  Hemoglobin 14.8, hematocrit 43.7, MCV 96.0, and platelets 363,000.  Ferritin is low at 26.  Plan: 1. Review labs from 11/26/2017.  2. Iron deficiency anemia - stable  Hemoglobin 14.8, hematocrit 43.7, MCV 96.0.  Ferritin low at 26.  Patient is fatigued, however not anemic. Discuss goal is to keep iron stores up to prevent anemia and need for blood transfusion as patient's religious preferences prevent her from receiving blood products.   Schedule for Venofer 200 mg IV x 2 infusion (first infusion today).   Encouraged to continue iron rich diet.  3. Balance and gait issues - ongoing  Recent negative head CT.   Patient has been  referred to Dr. Melrose Nakayama for further evaluation and treatment. Patient to be seen in consult on 12/23/2017.   Encouraged to follow up with PCP in the interim for any worsening symptoms.  4. RTC in 3 months for MD assessment, labs CBC with differential, ferritin-day before), and +/- Venofer.   Honor Loh, NP 11/27/2017,5:26 PM    I saw and evaluated the patient, participating in the key portions of the service and reviewing pertinent diagnostic studies and records.  I reviewed the nurse practitioner's note and agree with the findings and the plan.  The assessment and plan were discussed with the patient.  Several questions were asked by the patient and answered.   Nolon Stalls, MD 11/27/2017,5:26 PM

## 2017-11-27 NOTE — Progress Notes (Signed)
Patient offers no complaints today. 

## 2017-12-01 ENCOUNTER — Telehealth: Payer: Self-pay | Admitting: Podiatry

## 2017-12-01 NOTE — Telephone Encounter (Signed)
Patient was wondering is it a good idea for her to be walking on the treadmill, or should she wait until she gets her MRI results.

## 2017-12-01 NOTE — Telephone Encounter (Signed)
Called pt - advised she should hold off on any increased activity for now.

## 2017-12-04 ENCOUNTER — Inpatient Hospital Stay: Payer: Medicare Other

## 2017-12-04 ENCOUNTER — Other Ambulatory Visit: Payer: Self-pay | Admitting: Hematology and Oncology

## 2017-12-04 VITALS — BP 127/83 | HR 82 | Temp 97.3°F | Resp 20

## 2017-12-04 DIAGNOSIS — D508 Other iron deficiency anemias: Secondary | ICD-10-CM

## 2017-12-04 DIAGNOSIS — D509 Iron deficiency anemia, unspecified: Secondary | ICD-10-CM | POA: Diagnosis not present

## 2017-12-04 MED ORDER — IRON SUCROSE 20 MG/ML IV SOLN
200.0000 mg | Freq: Once | INTRAVENOUS | Status: AC
  Administered 2017-12-04: 200 mg via INTRAVENOUS
  Filled 2017-12-04: qty 10

## 2017-12-04 MED ORDER — SODIUM CHLORIDE 0.9 % IV SOLN
Freq: Once | INTRAVENOUS | Status: AC
  Administered 2017-12-04: 14:00:00 via INTRAVENOUS
  Filled 2017-12-04: qty 1000

## 2017-12-08 ENCOUNTER — Ambulatory Visit
Admission: RE | Admit: 2017-12-08 | Discharge: 2017-12-08 | Disposition: A | Discharge status: Home / Self Care | Payer: Medicare Other | Source: Ambulatory Visit | Attending: Podiatry | Admitting: Podiatry

## 2017-12-08 DIAGNOSIS — M7989 Other specified soft tissue disorders: Secondary | ICD-10-CM | POA: Insufficient documentation

## 2017-12-08 DIAGNOSIS — M722 Plantar fascial fibromatosis: Secondary | ICD-10-CM

## 2017-12-09 ENCOUNTER — Telehealth: Payer: Self-pay | Admitting: *Deleted

## 2017-12-09 NOTE — Telephone Encounter (Signed)
Left message informing pt of Dr. Stephenie Acres review of results and request to send copy of MRI disc to a radiology specialist for more indepth information for treatment planning. Faxed request for MRI disc copy to [facility].

## 2017-12-09 NOTE — Telephone Encounter (Signed)
-----   Message from Garrel Ridgel, [place] sent at 12/09/2017  7:58 AM EDT ----- Please send for an over read and inform patient of the delay.

## 2017-12-11 ENCOUNTER — Ambulatory Visit: Payer: Medicare Other

## 2017-12-14 NOTE — Telephone Encounter (Signed)
Received copy of MRI disc from [facility], mailed to [facility].

## 2017-12-15 ENCOUNTER — Ambulatory Visit: Payer: Medicare Other

## 2017-12-15 ENCOUNTER — Telehealth: Payer: Self-pay | Admitting: Podiatry

## 2017-12-15 NOTE — Telephone Encounter (Signed)
Pt. Needs a note for [employer] to put her Gym Membership on hold. Since shes unable to exercise. Patient would like to pick letter up, please call when it is available.

## 2017-12-16 ENCOUNTER — Encounter: Payer: Self-pay | Admitting: Podiatry

## 2017-12-16 NOTE — Telephone Encounter (Signed)
Will you please get note ready for patient to pick up.  Thanks   Patient has been notified of note.

## 2017-12-25 ENCOUNTER — Encounter: Payer: Self-pay | Admitting: Podiatry

## 2017-12-29 ENCOUNTER — Telehealth: Payer: Self-pay | Admitting: Podiatry

## 2017-12-29 DIAGNOSIS — G44209 Tension-type headache, unspecified, not intractable: Secondary | ICD-10-CM | POA: Insufficient documentation

## 2017-12-29 NOTE — Telephone Encounter (Signed)
Pt wanted to know MRI results and she stated that she hasn't heard anything in 3 weeks. Could someone please give her a call and let he know what the next steps are.

## 2017-12-30 ENCOUNTER — Encounter: Payer: Self-pay | Admitting: Podiatry

## 2017-12-30 ENCOUNTER — Ambulatory Visit (INDEPENDENT_AMBULATORY_CARE_PROVIDER_SITE_OTHER): Payer: Medicare Other | Admitting: Podiatry

## 2017-12-30 DIAGNOSIS — M722 Plantar fascial fibromatosis: Secondary | ICD-10-CM | POA: Diagnosis not present

## 2017-12-30 NOTE — Patient Instructions (Signed)
Pre-Operative Instructions  Congratulations, you have decided to take an important step towards improving your quality of life.  You can be assured that the doctors and staff at [facility] will be with you every step of the way.  Here are some important things you should know:  1. Plan to be at the surgery center/hospital at least 1 (one) hour prior to your scheduled time, unless otherwise directed by the surgical center/hospital staff.  You must have a responsible adult accompany you, remain during the surgery and drive you home.  Make sure you have directions to the surgical center/hospital to ensure you arrive on time. 2. If you are having surgery at [facility] or [facility], you will need a copy of your medical history and physical form from your family physician within one month prior to the date of surgery. We will give you a form for your primary physician to complete.  3. We make every effort to accommodate the date you request for surgery.  However, there are times where surgery dates or times have to be moved.  We will contact you as soon as possible if a change in schedule is required.   4. No aspirin/ibuprofen for one week before surgery.  If you are on aspirin, any non-steroidal anti-inflammatory medications (Mobic, Aleve, Ibuprofen) should not be taken seven (7) days prior to your surgery.  You make take Tylenol for pain prior to surgery.  5. Medications - If you are taking daily heart and blood pressure medications, seizure, reflux, allergy, asthma, anxiety, pain or diabetes medications, make sure you notify the surgery center/hospital before the day of surgery so they can tell you which medications you should take or avoid the day of surgery. 6. No food or drink after midnight the night before surgery unless directed otherwise by surgical center/hospital staff. 7. No alcoholic beverages 24-hours prior to surgery.  No smoking 24-hours prior or 24-hours after  surgery. 8. Wear loose pants or shorts. They should be loose enough to fit over bandages, boots, and casts. 9. Don't wear slip-on shoes. Sneakers are preferred. 10. Bring your boot with you to the surgery center/hospital.  Also bring crutches or a walker if your physician has prescribed it for you.  If you do not have this equipment, it will be provided for you after surgery. 11. If you have not been contacted by the surgery center/hospital by the day before your surgery, call to confirm the date and time of your surgery. 12. Leave-time from work may vary depending on the type of surgery you have.  Appropriate arrangements should be made prior to surgery with your employer. 13. Prescriptions will be provided immediately following surgery by your doctor.  Fill these as soon as possible after surgery and take the medication as directed. Pain medications will not be refilled on weekends and must be approved by the doctor. 14. Remove nail polish on the operative foot and avoid getting pedicures prior to surgery. 15. Wash the night before surgery.  The night before surgery wash the foot and leg well with water and the antibacterial soap provided. Be sure to pay special attention to beneath the toenails and in between the toes.  Wash for at least three (3) minutes. Rinse thoroughly with water and dry well with a towel.  Perform this wash unless told not to do so by your physician.  Enclosed: 1 Ice pack (please put in freezer the night before surgery)   1 Hibiclens skin cleaner     Pre-op instructions  If you have any questions regarding the instructions, please do not hesitate to call our office.  [place]: [address], [place], [place] [address] -- [phone]  [place]: [address] [place], [place] [address] -- [phone]  [place]: [address]  [place], [place] [address] -- [phone]  [place]: [address], [address], [place],  [address] -- [phone]  Website:  [email or web address] 

## 2017-12-30 NOTE — Progress Notes (Signed)
She presents today for follow-up of her plantar fasciitis states that seems to be getting so much worse I can hardly stand it.  Still having a lot of pain.  Objective: Vital signs are stable alert and oriented x3.  Pulses are palpable.  Neurologic sensorium is intact degenerative flexors are intact.  Muscle strength is normal symmetrical.  She has pain on palpation medial calcaneal tubercle of the right heel.  MRIs demonstrate medial band plantar fasciitis.  Assessment: Plantar fasciitis.  Plan: Discussed etiology pathology conservative surgical therapy at this point time consented her for an endoscopic plantar fasciotomy of the right heel.  We did discuss the possible postop complications which may include but not limited to postop pain bleeding swelling infection recurrence need further surgery overcorrection under correction possible fasciitis of the medial and lateral bands.  Dispensed both oral and written home-going instructions for her preop.  As well as anesthesia group and surgery center.  Dispensed a Cam walker.

## 2018-01-04 ENCOUNTER — Telehealth: Payer: Self-pay | Admitting: *Deleted

## 2018-01-04 NOTE — Telephone Encounter (Signed)
"  I am a patient of Dr. Roselind Messier.  I'm supposed to be having some foot surgery.  I'm calling to find out when that might be scheduled."

## 2018-01-05 NOTE — Telephone Encounter (Signed)
I attempted to return her call.  I left her a message to call me back. 

## 2018-01-07 NOTE — Telephone Encounter (Signed)
"  This is Caroline Hughes again.  I just got your message.  Could you just please let me know why you are calling me.  I don't know why you are calling me."  I am returning your call.  You called and left a message that you wanted to schedule your surgery with Dr. Milinda Pointer.  "Oh yes, we've been missing each other.  I thought I was going to get a call from the surgical center."  Dr. Stephenie Acres next available date is November 15.  "Okay, that works for me.  What time in the morning will it be?"  Someone from the surgical center will call you a day or two prior to your surgery date and they will give you your arrival time.

## 2018-01-07 NOTE — Telephone Encounter (Signed)
"  I got a message last night to call this morning.  So I'm calling you back."

## 2018-02-12 ENCOUNTER — Telehealth: Payer: Self-pay | Admitting: Podiatry

## 2018-02-12 NOTE — Telephone Encounter (Signed)
I'm scheduled to have surgery 15 November and I need to reschedule that. I'm going out of town on Tuesday for a few weeks. I can be reached at [phone]. Thank you.

## 2018-02-22 NOTE — Telephone Encounter (Signed)
I attempted to return her call.  I left her a message to give me a call tomorrow.

## 2018-02-25 NOTE — Telephone Encounter (Signed)
I left the patient a message that I received the message about rescheduling her appointment.  I told her I would cancel the appointment for November 15.  I asked her to call me back upon her return so I can reschedule her appointment.  I canceled the surgery via the surgical center's [employer] portal.

## 2018-02-26 NOTE — Telephone Encounter (Signed)
I will cancel her postop appointments and reschedule once her surgery is rescheduled.

## 2018-03-04 ENCOUNTER — Other Ambulatory Visit: Payer: Medicare Other

## 2018-03-05 ENCOUNTER — Ambulatory Visit: Payer: Medicare Other

## 2018-03-05 ENCOUNTER — Ambulatory Visit: Payer: Medicare Other | Admitting: Hematology and Oncology

## 2018-03-08 ENCOUNTER — Inpatient Hospital Stay: Payer: Medicare Other | Attending: Hematology and Oncology

## 2018-03-08 DIAGNOSIS — D509 Iron deficiency anemia, unspecified: Secondary | ICD-10-CM | POA: Insufficient documentation

## 2018-03-08 DIAGNOSIS — D500 Iron deficiency anemia secondary to blood loss (chronic): Secondary | ICD-10-CM

## 2018-03-08 LAB — CBC WITH DIFFERENTIAL/PLATELET
Abs Immature Granulocytes: 0.02 10*3/uL (ref 0.00–0.07)
Basophils Absolute: 0 10*3/uL (ref 0.0–0.1)
Basophils Relative: 1 %
Eosinophils Absolute: 0.2 10*3/uL (ref 0.0–0.5)
Eosinophils Relative: 3 %
HCT: 42.7 % (ref 36.0–46.0)
Hemoglobin: 14.3 g/dL (ref 12.0–15.0)
Immature Granulocytes: 0 %
Lymphocytes Relative: 26 %
Lymphs Abs: 1.6 10*3/uL (ref 0.7–4.0)
MCH: 31.8 pg (ref 26.0–34.0)
MCHC: 33.5 g/dL (ref 30.0–36.0)
MCV: 95.1 fL (ref 80.0–100.0)
Monocytes Absolute: 0.4 10*3/uL (ref 0.1–1.0)
Monocytes Relative: 7 %
Neutro Abs: 4.1 10*3/uL (ref 1.7–7.7)
Neutrophils Relative %: 63 %
Platelets: 372 10*3/uL (ref 150–400)
RBC: 4.49 MIL/uL (ref 3.87–5.11)
RDW: 11.9 % (ref 11.5–15.5)
WBC: 6.4 10*3/uL (ref 4.0–10.5)
nRBC: 0 % (ref 0.0–0.2)

## 2018-03-08 LAB — FERRITIN: Ferritin: 87 ng/mL (ref 11–307)

## 2018-03-09 ENCOUNTER — Inpatient Hospital Stay: Payer: Medicare Other | Admitting: Hematology and Oncology

## 2018-03-09 ENCOUNTER — Inpatient Hospital Stay: Payer: Medicare Other

## 2018-03-09 NOTE — Progress Notes (Deleted)
[place] [facility] day:  03/09/2018  Chief Complaint: Caroline Hughes is a 57 y.o. female with Crohn's disease and iron deficiency anemia who is seen for a 3 month assessment.  HPI:  The patient was last seen in the hematology clinic on 11/27/2017.  At that time, she felt chronically fatigued.  She noted the sensation of being "off balance" for 3 months.  She had a recent head CT that was negative.  She had been referred to neurology for further assessment.  Exam was grossly unremarkable.  WBC was 5400 ([place] 3600).  Hemoglobin was 14.8, hematocrit 43.7, MCV 96.0, and platelets 363,000.  Ferritin was low at 26.  She received Venofer weekly x 2 (11/27/2017 - 12/04/2017).  Lab son 03/08/2018 revealed a hematocrit of 42.7, hemoglobin 14.3, and MCV 95.1.  Ferritin was 87.  During the interim,   Past Medical History:  Diagnosis Date  . Anemia    Iron deficiency IRON INFUSIONS X 5 WEEKS  . Anxiety   . Arthritis   . Asthma   . Bronchitis 2017  . Complication of anesthesia   . Crohn's disease ([place])   . Depression   . Diabetes mellitus without complication ([place])   . Endometriosis   . Fibromyalgia   . Gastric ulcer   . GERD (gastroesophageal reflux disease)   . Herniated disc, cervical   . Hyperlipemia   . Intestinal obstruction ([place])   . Migraine    CHRONIC  . Nausea    CHRONIC  . Neuropathy   . Pain    BACK  . Peristomal hernia   . PONV (postoperative nausea and vomiting)   . Ulcerative colitis [facility])     Past Surgical History:  Procedure Laterality Date  . ABDOMINAL HYSTERECTOMY    . CATARACT EXTRACTION W/PHACO Right 11/25/2016   Procedure: CATARACT EXTRACTION PHACO AND INTRAOCULAR LENS PLACEMENT (IOC);  Surgeon: Birder Robson, MD;  Location: [facility] ORS;  Service: Ophthalmology;  Laterality: Right;  [place] 00:40 AP% 17.4 CDE 6.94 Fluid pack lot # [id number]  . CATARACT EXTRACTION W/PHACO Left 12/02/2016   Procedure: CATARACT EXTRACTION  PHACO AND INTRAOCULAR LENS PLACEMENT ([place]);  Surgeon: Birder Robson, MD;  Location: [facility] ORS;  Service: Ophthalmology;  Laterality: Left;  [place] 00:34 AP% 14.2 CDE 4.94 Flyuid pack lot 3 [id number]  . CHOLECYSTECTOMY    . COLON RESECTION    . ESOPHAGOGASTRODUODENOSCOPY (EGD) WITH PROPOFOL N/A 06/17/2017   Procedure: ESOPHAGOGASTRODUODENOSCOPY (EGD) WITH PROPOFOL;  Surgeon: [name], MD;  Location: [facility] ENDOSCOPY;  Service: Gastroenterology;  Laterality: N/A;  . ESOPHAGOGASTRODUODENOSCOPY (EGD) WITH PROPOFOL N/A 06/19/2017   Procedure: ESOPHAGOGASTRODUODENOSCOPY (EGD) WITH PROPOFOL;  Surgeon: [name], MD;  Location: [facility] ENDOSCOPY;  Service: Gastroenterology;  Laterality: N/A;  . HERNIA REPAIR     MULTIPLE PERISTOMAL  . ILEOSCOPY N/A 06/17/2017   Procedure: ILEOSCOPY THROUGH STOMA;  Surgeon: [name], MD;  Location: [facility] ENDOSCOPY;  Service: Gastroenterology;  Laterality: N/A;  . ILEOSCOPY N/A 06/23/2017   Procedure: ILEOSCOPY THROUGH STOMA;  Surgeon: Lin Landsman, MD;  Location: [facility] ENDOSCOPY;  Service: Gastroenterology;  Laterality: N/A;  . ILEOSTOMY Bilateral    MOVED X 3  . rectal fistula packing     X 3    Family History  Problem Relation Age of Onset  . Hypertension Father   . Hyperlipidemia Father   . Heart attack Father     Social History:  reports that she has quit smoking. She has never used smokeless tobacco.  She reports that she drinks about 3.0 standard drinks of alcohol per week. She reports that she does not use drugs.  She lives in [place].  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  Friends, Holley Raring and Estill Bamberg, often attend appointments with the patient. The patient is alone today.   Allergies:  Allergies  Allergen Reactions  . Naproxen Other (See Comments)    Other Reaction: GI bleed  . Penicillins Swelling, Rash and Other (See Comments)    Reaction: fever Has patient had a PCN reaction causing immediate rash, facial/tongue/throat swelling, SOB or  lightheadedness with hypotension: Yes Has patient had a PCN reaction causing severe rash involving mucus membranes or skin necrosis: No Has patient had a PCN reaction that required hospitalization: No Has patient had a PCN reaction occurring within the last 10 years: No If all of the above answers are "NO", then may proceed with Cephalosporin use.   . Gluten Meal     Bloating, gas, pain  . Lactose Intolerance (Gi)     Bloating, gas, pain  . Latex Itching    redness  . Soy Allergy Other (See Comments)    Reaction: digestive issues.  . Sulfonylureas Other (See Comments)    Reaction: Pt isn't sure what medication this one was associated with or what the reaction was.  . Doxycycline Rash  . Humira [Adalimumab] Rash and Other (See Comments)    Reaction: fever.  . Other Diarrhea, Nausea And Vomiting and Other (See Comments)    "DIGESTIVE ISSUES" Nuts "DIGESTIVE PROBLEMS"  . Simvastatin Rash  . Statins Rash  . Sulfa Antibiotics Rash  . Sulfasalazine Rash  . Vancomycin Rash and Other (See Comments)    Reaction: fever    Current Medications: Current Outpatient Medications  Medication Sig Dispense Refill  . albuterol (PROVENTIL HFA;VENTOLIN HFA) 108 (90 BASE) MCG/ACT inhaler Inhale 2 puffs into the lungs every 6 (six) hours as needed for wheezing or shortness of breath.    Marland Kitchen albuterol (PROVENTIL) (2.5 MG/3ML) 0.083% nebulizer solution Inhale 3 mLs into the lungs every 6 (six) hours as needed for shortness of breath.    . ALPRAZolam (XANAX) 1 MG tablet Take by mouth.    . baclofen (LIORESAL) 10 MG tablet Take 10 mg by mouth 2 (two) times daily.     . Certolizumab Pegol (CIMZIA) 2 X 200 MG KIT Inject 400 mg into the skin every 30 (thirty) days. Once a month on the 13th    . Cholecalciferol (VITAMIN D-1000 MAX ST) 1000 units tablet Take by mouth.    Marland Kitchen CIMZIA PREFILLED 2 X 200 MG/ML KIT     . cyanocobalamin (,VITAMIN B-12,) 1000 MCG/ML injection Inject 1,000 mcg into the muscle every 30  (thirty) days.     Marland Kitchen desipramine (NORPRAMIN) 25 MG tablet     . dicyclomine (BENTYL) 10 MG capsule Take 10 mg by mouth every morning.     . gabapentin (NEURONTIN) 800 MG tablet Take 800 mg by mouth 3 (three) times daily as needed (pain).     Marland Kitchen glucose blood (PRECISION QID TEST) test strip Use once daily    . hydrOXYzine (ATARAX/VISTARIL) 25 MG tablet Take 25 mg by mouth every 8 (eight) hours as needed for anxiety.     . insulin detemir (LEVEMIR) 100 UNIT/ML injection Inject 0.14 mLs (14 Units total) into the skin at bedtime. 10 mL 11  . Methylsulfonylmethane (MSM) 1000 MG CAPS     . metoCLOPramide (REGLAN) 10 MG tablet Take by mouth.    Marland Kitchen  Milnacipran (SAVELLA) 50 MG TABS tablet Take 50 mg by mouth 2 (two) times daily.    . montelukast (SINGULAIR) 10 MG tablet Take 10 mg by mouth at bedtime.     . ondansetron (ZOFRAN) 8 MG tablet Take by mouth.    . ondansetron (ZOFRAN-ODT) 8 MG disintegrating tablet Take 8 mg by mouth 2 (two) times daily as needed for nausea/vomiting.    . Pancrelipase, Lip-Prot-Amyl, 25000 units CPEP Take 1 capsule by mouth daily.     . pantoprazole (PROTONIX) 40 MG tablet Take 1 tablet (40 mg total) by mouth 2 (two) times daily before a meal. 60 tablet 0  . predniSONE (DELTASONE) 10 MG tablet     . ranitidine (ZANTAC) 150 MG capsule Take 150 mg by mouth daily as needed for heartburn.    . sucralfate (CARAFATE) 1 GM/10ML suspension Take 1 g by mouth 4 (four) times daily -  with meals and at bedtime.    . SUMAtriptan (IMITREX) 50 MG tablet Take by mouth.    Marland Kitchen tiZANidine (ZANAFLEX) 4 MG tablet Take by mouth.    . traZODone (DESYREL) 100 MG tablet Take 200 mg by mouth at bedtime.     Marland Kitchen ULTICARE MICRO PEN NEEDLES 32G X 4 MM MISC     . Vitamin D, Ergocalciferol, (DRISDOL) 50000 units CAPS capsule Take by mouth.     No current facility-administered medications for this visit.     Review of Systems  Constitutional: Positive for malaise/fatigue. Negative for diaphoresis, fever  and weight loss (weight up 7 pounds).       "I am just tired".  Feels "fair to middlin".  HENT: Negative.   Eyes: Negative.   Respiratory: Negative for cough, hemoptysis, sputum production and shortness of breath (resolved).   Cardiovascular: Negative for chest pain, palpitations, orthopnea, leg swelling and PND.  Gastrointestinal: Positive for nausea (mild intermittent). Negative for abdominal pain, blood in stool, constipation, diarrhea, melena and vomiting.       Crohn's doing "ok".  Genitourinary: Negative for dysuria, frequency, hematuria and urgency.  Musculoskeletal: Positive for back pain (chronic) and joint pain (chronic pain in neck due to herniated disc). Negative for falls and myalgias.       PMH (+) for fibromyalgia, scoliosis, and osteopenia.  Skin: Negative for itching and rash.  Neurological: Positive for headaches. Negative for dizziness, tremors and weakness.       Sensation of being "off balance" x 3 months - recent head CT (-).   Endo/Heme/Allergies: Does not bruise/bleed easily.  Psychiatric/Behavioral: Negative for depression, memory loss and suicidal ideas. The patient is not nervous/anxious and does not have insomnia.   All other systems reviewed and are negative.  Performance status (ECOG): 2 - Symptomatic, <50% confined to bed  Vital Signs There were no vitals taken for this visit.  Physical Exam  Constitutional: She is oriented to person, place, and time and well-developed, well-nourished, and in no distress.  HENT:  Head: Normocephalic and atraumatic.  Mouth/Throat: Abnormal dentition (poor dentition).  Brown hair.  Eyes: Pupils are equal, round, and reactive to light. EOM are normal. No scleral icterus.  Brown eyes.   Neck: Normal range of motion. Neck supple. No tracheal deviation present. No thyromegaly present.  Cardiovascular: Normal rate, regular rhythm and normal heart sounds. Exam reveals no gallop and no friction rub.  No murmur  heard. Pulmonary/Chest: Effort normal and breath sounds normal. No respiratory distress. She has no wheezes. She has no rales.  Abdominal: Soft. Bowel  sounds are normal. She exhibits no distension. There is no tenderness.  Musculoskeletal: Normal range of motion. She exhibits no edema or tenderness.  Neurological: She is alert and oriented to person, place, and time.  Skin: Skin is warm and dry. No rash noted. No erythema.  Psychiatric: Mood, affect and judgment normal.  Nursing note and vitals reviewed.   Appointment on 03/08/2018  Component Date Value Ref Range Status  . Ferritin 03/08/2018 87  11 - 307 ng/mL Final   Performed at [facility], [address] [place], [place] [address]  . WBC 03/08/2018 6.4  4.0 - 10.5 K/uL Final  . RBC 03/08/2018 4.49  3.87 - 5.11 MIL/uL Final  . Hemoglobin 03/08/2018 14.3  12.0 - 15.0 g/dL Final  . HCT 03/08/2018 42.7  36.0 - 46.0 % Final  . MCV 03/08/2018 95.1  80.0 - 100.0 fL Final  . MCH 03/08/2018 31.8  26.0 - 34.0 pg Final  . MCHC 03/08/2018 33.5  30.0 - 36.0 g/dL Final  . RDW 03/08/2018 11.9  11.5 - 15.5 % Final  . Platelets 03/08/2018 372  150 - 400 K/uL Final  . nRBC 03/08/2018 0.0  0.0 - 0.2 % Final  . Neutrophils Relative % 03/08/2018 63  % Final  . Neutro Abs 03/08/2018 4.1  1.7 - 7.7 K/uL Final  . Lymphocytes Relative 03/08/2018 26  % Final  . Lymphs Abs 03/08/2018 1.6  0.7 - 4.0 K/uL Final  . Monocytes Relative 03/08/2018 7  % Final  . Monocytes Absolute 03/08/2018 0.4  0.1 - 1.0 K/uL Final  . Eosinophils Relative 03/08/2018 3  % Final  . Eosinophils Absolute 03/08/2018 0.2  0.0 - 0.5 K/uL Final  . Basophils Relative 03/08/2018 1  % Final  . Basophils Absolute 03/08/2018 0.0  0.0 - 0.1 K/uL Final  . Immature Granulocytes 03/08/2018 0  % Final  . Abs Immature Granulocytes 03/08/2018 0.02  0.00 - 0.07 K/uL Final   Performed at [facility], [address] [place],  [address]    Assessment:  ALIZEH MADRIL is a 57 y.o. female with Crohn's disease and iron deficiency anemia.  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  Her diagnosis was changed to Crohn's disease.  She has a history of skin reaction to Humira.  She has been on Cimzia since approximately 2013.   Ileoscopy and EGD at [facility] in 09/2012 was normal.  EGD on 12/21/2014 revealed a normal esophagus and stomach and non-bleeding duodenal ulcers (largest 3 mm).  Ileoscopy at [facility] on 01/23/2014 revealed a normal study with normal biopsies of ileum.   Diet is modest.  She denies any GI or vaginal bleeding.  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  Work-up on 02/18/2016 revealed a  hematocrit of 28.6, hemoglobin 8.7, MCV 68.5, platelets 484,000, white count 7400 with an ANC of 5200.  Ferritin was 4.  Iron studies included a saturation of 2% and a TIBC of 583.  Sedimentation rate was 24.  B12 was 785.  Folate was 6.8.  B12 was 691 and folate 14.5 on 06/19/2017.  She received IV iron 1.5 - 2 years ago.  She received Venofer 200 mg IV weekly x 3 (02/26/2016 - 03/10/2016).  She received Feraheme 510 mg on 06/25/2017.    Ferritin has been followed: 4 on 02/18/2016, 70 on 03/17/2016, 15 on 05/19/2016, 22  on 06/19/2017, 317 on 06/26/2017, 117 on 07/10/2017, 91 on 07/27/2017, 30 on 10/26/2017, 26 on 11/27/2017, and 87 on 03/08/2018.  Ferritin goal is 100.  She was admitted to [facility] from 06/15/2017 - 06/26/2017 with acute GI bleeding.  EGD on 06/17/2017 revealed an oozing duodenal ulcer.  A hemostatic clip was placed.  Tagged RBC scan on 06/18/2017 was negative.  Ileoscopy on 06/23/2017 showed no active evidence of Crohn's disease and no other bleeding lesions.  She received Venofer 200 mg IV on 06/19/2017 and 06/22/2017.  She received Aranesp 40 mcg on 06/19/2017.  Hemoglobin nadir was 4.9 on 06/22/2017 and 6.7 on  06/26/2017.  Symptomatically,  patient remains chronically fatigued.  She notes the sensation of being "off balance" for the last 3 months.  She has had a recent head CT that resulted as negative.  Patient has been referred to neurology for further assessment.  Exam is grossly unremarkable.  WBC 5400 ([place] 3600).  Hemoglobin 14.8, hematocrit 43.7, MCV 96.0, and platelets 363,000.  Ferritin is low at 26.  Plan: 1. Review labs from 03/08/2018.    2. Iron deficiency anemia - stable  Hemoglobin 14.8, hematocrit 43.7, MCV 96.0.  Ferritin low at 26.  Patient is fatigued, however not anemic. Discuss goal is to keep iron stores up to prevent anemia and need for blood transfusion as patient's religious preferences prevent her from receiving blood products.   Schedule for Venofer 200 mg IV x 2 infusion (first infusion today).   Encouraged to continue iron rich diet.  3. Balance and gait issues - ongoing  Recent negative head CT.   Patient has been referred to Dr. Melrose Nakayama for further evaluation and treatment. Patient to be seen in consult on 12/23/2017.   Encouraged to follow up with PCP in the interim for any worsening symptoms.  4. RTC in 3 months for MD assessment, labs CBC with differential, ferritin-day before), and +/- Venofer.   Lequita Asal, MD 03/09/2018,7:51 AM    I saw and evaluated the patient, participating in the key portions of the service and reviewing pertinent diagnostic studies and records.  I reviewed the nurse practitioner's note and agree with the findings and the plan.  The assessment and plan were discussed with the patient.  Several questions were asked by the patient and answered.   Nolon Stalls, MD 11/27/2017,5:26 PM

## 2018-03-10 ENCOUNTER — Encounter: Payer: Medicare Other | Admitting: Podiatry

## 2018-03-10 ENCOUNTER — Telehealth: Payer: Self-pay | Admitting: *Deleted

## 2018-03-10 NOTE — Telephone Encounter (Signed)
"  I had to cancel my surgery last week.  I've been having to travel up [place] to check on my mother.  I don't know when I may have to go back.  It could be at any time.  When can I reschedule or will I have to come in an see Dr. Milinda Pointer again?"  You do not need to come in to see Dr. Milinda Pointer again yet.  Dr. Milinda Pointer can do your surgery on December 20.  "Go ahead and put me down for then."  I'll get it scheduled.  Someone from the surgical center will give you a call with the arrival time.  I rescheduled the surgery via [facility]'s, [employer], portal.

## 2018-03-12 ENCOUNTER — Encounter: Payer: Self-pay | Admitting: Podiatry

## 2018-03-16 ENCOUNTER — Encounter: Payer: Medicare Other | Admitting: Podiatry

## 2018-03-31 ENCOUNTER — Encounter: Payer: Medicare Other | Admitting: Podiatry

## 2018-03-31 ENCOUNTER — Telehealth: Payer: Self-pay | Admitting: Podiatry

## 2018-03-31 NOTE — Telephone Encounter (Signed)
I'm scheduled to have surgery on 20 December with Dr. Milinda Pointer. I want to cancel my surgery as my foot is doing better. If I have any future problems I will get with Dr. Milinda Pointer to schedule and discuss surgery again. My phone number is [phone]. Thank you.

## 2018-03-31 NOTE — Telephone Encounter (Signed)
I canceled surgery in both the surgery book and [employer]. I have also canceled her postop visits as well.

## 2018-04-12 ENCOUNTER — Encounter: Payer: Medicare Other | Admitting: Podiatry

## 2018-04-16 ENCOUNTER — Encounter: Payer: Medicare Other | Admitting: Podiatry

## 2018-04-23 ENCOUNTER — Encounter: Payer: Medicare Other | Admitting: Podiatry

## 2018-05-05 ENCOUNTER — Encounter: Payer: Medicare Other | Admitting: Podiatry

## 2018-05-19 ENCOUNTER — Encounter: Payer: Medicare Other | Admitting: Podiatry

## 2018-06-16 DIAGNOSIS — G8929 Other chronic pain: Secondary | ICD-10-CM | POA: Insufficient documentation

## 2018-06-16 DIAGNOSIS — M79601 Pain in right arm: Secondary | ICD-10-CM | POA: Insufficient documentation

## 2018-06-16 DIAGNOSIS — M79641 Pain in right hand: Secondary | ICD-10-CM | POA: Insufficient documentation

## 2018-06-16 DIAGNOSIS — M542 Cervicalgia: Secondary | ICD-10-CM | POA: Insufficient documentation

## 2018-09-20 DIAGNOSIS — M19041 Primary osteoarthritis, right hand: Secondary | ICD-10-CM | POA: Insufficient documentation

## 2018-11-25 DIAGNOSIS — M25512 Pain in left shoulder: Secondary | ICD-10-CM | POA: Insufficient documentation

## 2018-11-25 DIAGNOSIS — M25511 Pain in right shoulder: Secondary | ICD-10-CM | POA: Insufficient documentation

## 2018-11-25 DIAGNOSIS — G8929 Other chronic pain: Secondary | ICD-10-CM | POA: Insufficient documentation

## 2019-01-11 ENCOUNTER — Other Ambulatory Visit: Payer: Self-pay | Admitting: Student

## 2019-01-11 DIAGNOSIS — R1310 Dysphagia, unspecified: Secondary | ICD-10-CM

## 2019-01-14 ENCOUNTER — Other Ambulatory Visit: Payer: Self-pay

## 2019-01-14 ENCOUNTER — Other Ambulatory Visit: Payer: Self-pay | Admitting: Student

## 2019-01-14 ENCOUNTER — Ambulatory Visit
Admission: RE | Admit: 2019-01-14 | Discharge: 2019-01-14 | Disposition: A | Discharge status: Home / Self Care | Payer: Medicare Other | Source: Ambulatory Visit | Attending: Student | Admitting: Student

## 2019-01-14 DIAGNOSIS — R1310 Dysphagia, unspecified: Secondary | ICD-10-CM

## 2019-01-21 ENCOUNTER — Encounter
Admission: RE | Admit: 2019-01-21 | Discharge: 2019-01-21 | Disposition: A | Discharge status: Home / Self Care | Payer: Medicare Other | Source: Ambulatory Visit | Attending: Internal Medicine | Admitting: Internal Medicine

## 2019-01-21 ENCOUNTER — Other Ambulatory Visit: Payer: Self-pay

## 2019-01-21 DIAGNOSIS — Z01812 Encounter for preprocedural laboratory examination: Secondary | ICD-10-CM | POA: Insufficient documentation

## 2019-01-21 DIAGNOSIS — Z20828 Contact with and (suspected) exposure to other viral communicable diseases: Secondary | ICD-10-CM | POA: Diagnosis not present

## 2019-01-22 LAB — SARS CORONAVIRUS 2 (TAT 6-24 HRS): SARS Coronavirus 2: NEGATIVE

## 2019-01-26 ENCOUNTER — Encounter: Payer: Self-pay | Admitting: Anesthesiology

## 2019-01-26 ENCOUNTER — Ambulatory Visit: Payer: Medicare Other | Admitting: Anesthesiology

## 2019-01-26 ENCOUNTER — Ambulatory Visit
Admission: RE | Admit: 2019-01-26 | Discharge: 2019-01-26 | Disposition: A | Discharge status: Home / Self Care | Payer: Medicare Other | Attending: Internal Medicine | Admitting: Internal Medicine

## 2019-01-26 ENCOUNTER — Encounter
Admission: RE | Disposition: A | Discharge status: Home / Self Care | Payer: Self-pay | Source: Home / Self Care | Attending: Internal Medicine

## 2019-01-26 ENCOUNTER — Other Ambulatory Visit: Payer: Self-pay

## 2019-01-26 DIAGNOSIS — K449 Diaphragmatic hernia without obstruction or gangrene: Secondary | ICD-10-CM | POA: Diagnosis not present

## 2019-01-26 DIAGNOSIS — F329 Major depressive disorder, single episode, unspecified: Secondary | ICD-10-CM | POA: Diagnosis not present

## 2019-01-26 DIAGNOSIS — Z8711 Personal history of peptic ulcer disease: Secondary | ICD-10-CM | POA: Insufficient documentation

## 2019-01-26 DIAGNOSIS — F419 Anxiety disorder, unspecified: Secondary | ICD-10-CM | POA: Diagnosis not present

## 2019-01-26 DIAGNOSIS — E1140 Type 2 diabetes mellitus with diabetic neuropathy, unspecified: Secondary | ICD-10-CM | POA: Diagnosis not present

## 2019-01-26 DIAGNOSIS — Z794 Long term (current) use of insulin: Secondary | ICD-10-CM | POA: Diagnosis not present

## 2019-01-26 DIAGNOSIS — K209 Esophagitis, unspecified without bleeding: Secondary | ICD-10-CM | POA: Insufficient documentation

## 2019-01-26 DIAGNOSIS — K2970 Gastritis, unspecified, without bleeding: Secondary | ICD-10-CM | POA: Insufficient documentation

## 2019-01-26 DIAGNOSIS — K5090 Crohn's disease, unspecified, without complications: Secondary | ICD-10-CM | POA: Insufficient documentation

## 2019-01-26 DIAGNOSIS — Z7952 Long term (current) use of systemic steroids: Secondary | ICD-10-CM | POA: Insufficient documentation

## 2019-01-26 DIAGNOSIS — K219 Gastro-esophageal reflux disease without esophagitis: Secondary | ICD-10-CM | POA: Insufficient documentation

## 2019-01-26 DIAGNOSIS — Z87891 Personal history of nicotine dependence: Secondary | ICD-10-CM | POA: Insufficient documentation

## 2019-01-26 DIAGNOSIS — J449 Chronic obstructive pulmonary disease, unspecified: Secondary | ICD-10-CM | POA: Diagnosis not present

## 2019-01-26 DIAGNOSIS — M797 Fibromyalgia: Secondary | ICD-10-CM | POA: Diagnosis not present

## 2019-01-26 DIAGNOSIS — R1310 Dysphagia, unspecified: Secondary | ICD-10-CM | POA: Insufficient documentation

## 2019-01-26 DIAGNOSIS — K3189 Other diseases of stomach and duodenum: Secondary | ICD-10-CM | POA: Diagnosis not present

## 2019-01-26 DIAGNOSIS — Z79899 Other long term (current) drug therapy: Secondary | ICD-10-CM | POA: Diagnosis not present

## 2019-01-26 HISTORY — PX: ESOPHAGOGASTRODUODENOSCOPY (EGD) WITH PROPOFOL: SHX5813

## 2019-01-26 SURGERY — ESOPHAGOGASTRODUODENOSCOPY (EGD) WITH PROPOFOL
Anesthesia: General

## 2019-01-26 MED ORDER — SODIUM CHLORIDE 0.9 % IV SOLN
INTRAVENOUS | Status: DC
  Administered 2019-01-26: 09:00:00 via INTRAVENOUS

## 2019-01-26 MED ORDER — MIDAZOLAM HCL 2 MG/2ML IJ SOLN
INTRAMUSCULAR | Status: DC | PRN
  Administered 2019-01-26: 2 mg via INTRAVENOUS

## 2019-01-26 MED ORDER — FENTANYL CITRATE (PF) 100 MCG/2ML IJ SOLN
INTRAMUSCULAR | Status: DC | PRN
  Administered 2019-01-26 (×2): 50 ug via INTRAVENOUS

## 2019-01-26 MED ORDER — PROPOFOL 10 MG/ML IV BOLUS
INTRAVENOUS | Status: DC | PRN
  Administered 2019-01-26: 20 mg via INTRAVENOUS
  Administered 2019-01-26: 80 mg via INTRAVENOUS
  Administered 2019-01-26: 20 mg via INTRAVENOUS

## 2019-01-26 MED ORDER — LIDOCAINE HCL (PF) 2 % IJ SOLN
INTRAMUSCULAR | Status: AC
  Filled 2019-01-26: qty 10

## 2019-01-26 MED ORDER — LIDOCAINE HCL (CARDIAC) PF 100 MG/5ML IV SOSY
PREFILLED_SYRINGE | INTRAVENOUS | Status: DC | PRN
  Administered 2019-01-26: 50 mg via INTRAVENOUS

## 2019-01-26 MED ORDER — FENTANYL CITRATE (PF) 100 MCG/2ML IJ SOLN
INTRAMUSCULAR | Status: AC
  Filled 2019-01-26: qty 2

## 2019-01-26 MED ORDER — PROPOFOL 500 MG/50ML IV EMUL
INTRAVENOUS | Status: AC
  Filled 2019-01-26: qty 50

## 2019-01-26 MED ORDER — MIDAZOLAM HCL 2 MG/2ML IJ SOLN
INTRAMUSCULAR | Status: AC
  Filled 2019-01-26: qty 2

## 2019-01-26 MED ORDER — PROPOFOL 500 MG/50ML IV EMUL
INTRAVENOUS | Status: DC | PRN
  Administered 2019-01-26: 130 ug/kg/min via INTRAVENOUS

## 2019-01-26 NOTE — Transfer of Care (Signed)
Immediate Anesthesia Transfer of Care Note  Patient: Caroline Hughes  Procedure(s) Performed: ESOPHAGOGASTRODUODENOSCOPY (EGD) WITH PROPOFOL (N/A )  Patient Location: PACU and Endoscopy Unit  Anesthesia Type:General  Level of Consciousness: awake, alert , oriented and patient cooperative  Airway & Oxygen Therapy: Patient Spontanous Breathing  Post-op Assessment: Report given to RN and Post -op Vital signs reviewed and stable  Post vital signs: Reviewed and stable  Last Vitals:  Vitals Value Taken Time  BP 105/52 01/26/19 0943  Temp 36 C 01/26/19 0943  Pulse 102 01/26/19 0944  Resp 18 01/26/19 0944  SpO2 93 % 01/26/19 0944  Vitals shown include unvalidated device data.  Last Pain:  Vitals:   01/26/19 0943  TempSrc: Tympanic  PainSc: 0-No pain         Complications: No apparent anesthesia complications

## 2019-01-26 NOTE — Op Note (Addendum)
[facility]astroenterology Patient Name: Caroline Hughes Procedure Date: 01/26/2019 9:24 AM MRN: [id number] Account #: [id number] Date of Birth: March 24, 1961 Admit Type: Outpatient Age: 58 Room: [facility] ENDO ROOM 3 Gender: Female Note Status: Finalized Procedure:            Upper GI endoscopy Indications:          Dysphagia, Suspected stricture of the esophagus,                        Abnormal UGI series Providers:            Benay Pike. Hubbert Landrigan MD, MD Medicines:            Propofol per Anesthesia Complications:        No immediate complications. Procedure:            Pre-Anesthesia Assessment:                       - The risks and benefits of the procedure and the                        sedation options and risks were discussed with the                        patient. All questions were answered and informed                        consent was obtained.                       - Patient identification and proposed procedure were                        verified prior to the procedure by the nurse. The                        procedure was verified in the procedure room.                       - ASA Grade Assessment: III - A patient with severe                        systemic disease.                       - After reviewing the risks and benefits, the patient                        was deemed in satisfactory condition to undergo the                        procedure.                       After obtaining informed consent, the endoscope was                        passed under direct vision. Throughout the procedure,                        the patient's blood pressure, pulse, and oxygen  saturations were monitored continuously. The Endoscope                        was introduced through the mouth, and advanced to the                        third part of duodenum. The upper GI endoscopy was                        accomplished without difficulty. The  patient tolerated                        the procedure well. Findings:      Mucosal changes including longitudinal furrows and white plaques were       found in the entire esophagus. Esophageal findings were graded using the       Eosinophilic Esophagitis Endoscopic Reference Score (EoE-EREFS) as:       Edema Grade 0 Normal (distinct vascular markings), Rings Grade 1 Mild       (subtle circumferential ridges seen on esophageal distension), Exudates       Grade 1 Mild (scattered white lesions involving less than 10 percent of       the esophageal surface area), Furrows Grade 0 None (no vertical lines       seen) and Stricture none (no stricture found). Biopsies were obtained       from the proximal and distal esophagus with cold forceps for histology       of suspected eosinophilic esophagitis.      The scope was withdrawn. Dilation was performed in the distal esophagus       with a Maloney dilator with mild resistance at 42 Fr.      Scattered moderate inflammation characterized by congestion (edema),       erosions and erythema was found in the entire examined stomach.      A 2 cm hiatal hernia was present.      A deformity was found at the pylorus.      Patchy moderately erythematous mucosa without active bleeding and with       no stigmata of bleeding was found in the duodenal bulb.      The second portion of the duodenum and third portion of the duodenum       were normal.      The exam was otherwise without abnormality. Impression:           - Esophageal mucosal changes suggestive of eosinophilic                        esophagitis. Biopsied.                       - Gastritis.                       - 2 cm hiatal hernia.                       - Acquired deformity in the pylorus.                       - Erythematous duodenopathy.                       -  Normal second portion of the duodenum and third                        portion of the duodenum.                       - The  examination was otherwise normal.                       - Dilation performed in the distal esophagus. Recommendation:       - Patient has a contact number available for                        emergencies. The signs and symptoms of potential                        delayed complications were discussed with the patient.                        Return to normal activities tomorrow. Written discharge                        instructions were provided to the patient.                       - Resume previous diet.                       - Continue present medications.                       - Await pathology results.                       - Repeat upper endoscopy PRN for retreatment.                       - Return to physician assistant in 2 months.                       - Please follow up with Tammi Klippel, PA-C for your                        visit to [facility]. Of course, I                        remain available to you if you need any help. Procedure Code(s):    --- Professional ---                       [phone], Esophagogastroduodenoscopy, flexible, transoral;                        with biopsy, single or multiple                       43450, Dilation of esophagus, by unguided sound or                        bougie, single or multiple passes Diagnosis Code(s):    --- Professional ---  R93.3, Abnormal findings on diagnostic imaging of other                        parts of digestive tract                       R13.10, Dysphagia, unspecified                       K31.89, Other diseases of stomach and duodenum                       K44.9, Diaphragmatic hernia without obstruction or                        gangrene                       K29.70, Gastritis, unspecified, without bleeding                       K22.8, Other specified diseases of esophagus CPT copyright 2019 American Medical Association. All rights reserved. The codes documented in this report  are preliminary and upon coder review may  be revised to meet current compliance requirements. Efrain Sella MD, MD 01/26/2019 9:44:22 AM This report has been signed electronically. Number of Addenda: 0 Note Initiated On: 01/26/2019 9:24 AM Estimated Blood Loss: Estimated blood loss: none.      [facility]

## 2019-01-26 NOTE — Anesthesia Post-op Follow-up Note (Signed)
Anesthesia QCDR form completed.        

## 2019-01-26 NOTE — H&P (Signed)
Outpatient short stay form Pre-procedure 01/26/2019 8:42 AM Caroline Hughes K. Alice Reichert, M.D.  Primary Physician: Salome Holmes, M.D.  Reason for visit:  Dysphagia, GERD, abnormal barium xray  History of present illness:  58 y/o female presents for c/o solid food dysphagia to the sternal notch. 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    Current Facility-Administered Medications:  .  0.9 %  sodium chloride infusion, , Intravenous, Continuous, Mertis Mosher, TBenay Pike MD  Medications Prior to Admission  Medication Sig Dispense Refill Last Dose  . albuterol (PROVENTIL HFA;VENTOLIN HFA) 108 (90 BASE) MCG/ACT inhaler Inhale 2 puffs into the lungs every 6 (six) hours as needed for wheezing or shortness of breath.     .Marland Kitchenalbuterol (PROVENTIL) (2.5 MG/3ML) 0.083% nebulizer solution Inhale 3 mLs into the lungs every 6 (six) hours as needed for shortness of breath.     . ALPRAZolam (XANAX) 1 MG tablet Take by mouth.     . baclofen (LIORESAL) 10 MG tablet Take 10 mg by mouth 2 (two) times daily.      . Certolizumab Pegol (CIMZIA) 2 X 200 MG KIT Inject 400 mg into the skin every 30 (thirty) days. Once a month on the 13th     . Cholecalciferol (VITAMIN D-1000 MAX ST) 1000 units tablet Take by mouth.     .Marland KitchenCIMZIA PREFILLED 2 X 200 MG/ML KIT      . cyanocobalamin (,VITAMIN B-12,) 1000 MCG/ML injection Inject 1,000 mcg into the muscle every 30 (thirty) days.      .Marland Kitchendesipramine (NORPRAMIN) 25 MG tablet      . dicyclomine (BENTYL) 10 MG capsule Take 10 mg by mouth every morning.      . gabapentin (NEURONTIN) 800 MG tablet Take 800 mg by mouth 3 (three) times daily as needed (pain).      . hydrOXYzine (ATARAX/VISTARIL) 25 MG tablet Take 25 mg by mouth every 8 (eight) hours as needed for anxiety.      . insulin detemir (LEVEMIR) 100 UNIT/ML injection Inject 0.14 mLs (14 Units total) into the skin at bedtime. 10 mL 11   . Methylsulfonylmethane (MSM) 1000 MG  CAPS      . metoCLOPramide (REGLAN) 10 MG tablet Take by mouth.     . Milnacipran (SAVELLA) 50 MG TABS tablet Take 50 mg by mouth 2 (two) times daily.     . montelukast (SINGULAIR) 10 MG tablet Take 10 mg by mouth at bedtime.      . ondansetron (ZOFRAN) 8 MG tablet Take by mouth.     . ondansetron (ZOFRAN-ODT) 8 MG disintegrating tablet Take 8 mg by mouth 2 (two) times daily as needed for nausea/vomiting.     . Pancrelipase, Lip-Prot-Amyl, 25000 units CPEP Take 1 capsule by mouth daily.      . pantoprazole (PROTONIX) 40 MG tablet Take 1 tablet (40 mg total) by mouth 2 (two) times daily before a meal. 60 tablet 0   . predniSONE (DELTASONE) 10 MG tablet      . ranitidine (ZANTAC) 150 MG capsule Take 150 mg by mouth daily as needed for heartburn.     . sucralfate (CARAFATE) 1 GM/10ML suspension Take 1 g by mouth 4 (four) times daily -  with meals and at bedtime.     .Marland KitchentiZANidine (ZANAFLEX) 4 MG tablet Take by mouth.     . traZODone (DESYREL) 100 MG tablet Take 200 mg by mouth at bedtime.      .Marland Kitchen  ULTICARE MICRO PEN NEEDLES 32G X 4 MM MISC      . Vitamin D, Ergocalciferol, (DRISDOL) 50000 units CAPS capsule Take by mouth.        Allergies  Allergen Reactions  . Naproxen Other (See Comments)    Other Reaction: GI bleed  . Penicillins Swelling, Rash and Other (See Comments)    Reaction: fever Has patient had a PCN reaction causing immediate rash, facial/tongue/throat swelling, SOB or lightheadedness with hypotension: Yes Has patient had a PCN reaction causing severe rash involving mucus membranes or skin necrosis: No Has patient had a PCN reaction that required hospitalization: No Has patient had a PCN reaction occurring within the last 10 years: No If all of the above answers are "NO", then may proceed with Cephalosporin use.   . Gluten Meal     Bloating, gas, pain  . Lactose Intolerance (Gi)     Bloating, gas, pain  . Latex Itching    redness  . Soy Allergy Other (See Comments)     Reaction: digestive issues.  . Sulfonylureas Other (See Comments)    Reaction: Pt isn't sure what medication this one was associated with or what the reaction was.  . Doxycycline Rash  . Humira [Adalimumab] Rash and Other (See Comments)    Reaction: fever.  . Other Diarrhea, Nausea And Vomiting and Other (See Comments)    "DIGESTIVE ISSUES" Nuts "DIGESTIVE PROBLEMS"  . Simvastatin Rash  . Statins Rash  . Sulfa Antibiotics Rash  . Sulfasalazine Rash  . Vancomycin Rash and Other (See Comments)    Reaction: fever     Past Medical History:  Diagnosis Date  . Anemia    Iron deficiency IRON INFUSIONS X 5 WEEKS  . Anxiety   . Arthritis   . Asthma   . Bronchitis 2017  . Complication of anesthesia   . Crohn's disease ([place])   . Depression   . Diabetes mellitus without complication ([place])   . Endometriosis   . Fibromyalgia   . Gastric ulcer   . GERD (gastroesophageal reflux disease)   . Herniated disc, cervical   . Hyperlipemia   . Intestinal obstruction ([place])   . Migraine    CHRONIC  . Nausea    CHRONIC  . Neuropathy   . Pain    BACK  . Peristomal hernia   . PONV (postoperative nausea and vomiting)   . Ulcerative colitis ([place])     Review of systems:  Otherwise negative.    Physical Exam  Gen: Alert, oriented. Appears stated age.  HEENT: [place]/AT. PERRLA. Lungs: CTA, no wheezes. CV: RR nl S1, S2. Abd: soft, benign, no masses. BS+ Ext: No edema. Pulses 2+    Planned procedures: Proceed with EGD and possible biopsy/dilation. The patient understands the nature of the planned procedure, indications, risks, alternatives and potential complications including but not limited to bleeding, infection, perforation, damage to internal organs and possible oversedation/side effects from anesthesia. The patient agrees and gives consent to proceed.  Please refer to procedure notes for findings, recommendations and patient disposition/instructions.     Airika Alkhatib K. Alice Reichert,  M.D. Gastroenterology 01/26/2019  8:42 AM

## 2019-01-26 NOTE — Anesthesia Postprocedure Evaluation (Signed)
Anesthesia Post Note  Patient: Caroline Hughes  Procedure(s) Performed: ESOPHAGOGASTRODUODENOSCOPY (EGD) WITH PROPOFOL (N/A )  Patient location during evaluation: Endoscopy Anesthesia Type: General Level of consciousness: awake and alert Pain management: pain level controlled Vital Signs Assessment: post-procedure vital signs reviewed and stable Respiratory status: spontaneous breathing, nonlabored ventilation, respiratory function stable and patient connected to nasal cannula oxygen Cardiovascular status: blood pressure returned to baseline and stable Postop Assessment: no apparent nausea or vomiting Anesthetic complications: no     Last Vitals:  Vitals:   01/26/19 0943 01/26/19 0953  BP: (!) 105/52 123/82  Pulse: (!) 101 (!) 109  Resp: 18 (!) 21  Temp: (!) 36 C   SpO2: 93% 95%    Last Pain:  Vitals:   01/26/19 0953  TempSrc:   PainSc: 0-No pain                 Martha Clan

## 2019-01-26 NOTE — Anesthesia Preprocedure Evaluation (Signed)
Anesthesia Evaluation  Patient identified by MRN, date of birth, ID band Patient awake    Reviewed: Allergy & Precautions, NPO status , Patient's Chart, lab work & pertinent test results  History of Anesthesia Complications (+) PONV and history of anesthetic complications  Airway Mallampati: II  TM Distance: >3 FB Neck ROM: Full    Dental  (+) Chipped, Poor Dentition   Pulmonary neg shortness of breath, asthma , neg sleep apnea, COPD, neg recent URI, former smoker,    breath sounds clear to auscultation- rhonchi (-) wheezing      Cardiovascular Exercise Tolerance: Good (-) hypertension(-) angina(-) CAD, (-) Past MI, (-) Cardiac Stents and (-) CABG  Rhythm:Regular Rate:Normal - Systolic murmurs and - Diastolic murmurs    Neuro/Psych  Headaches, PSYCHIATRIC DISORDERS Anxiety Depression    GI/Hepatic Neg liver ROS, PUD, GERD  ,  Endo/Other  diabetes, Insulin Dependent  Renal/GU negative Renal ROS     Musculoskeletal  (+) Arthritis , Fibromyalgia -  Abdominal (+) - obese,   Peds  Hematology  (+) Blood dyscrasia, anemia , JEHOVAH'S WITNESS  Anesthesia Other Findings Past Medical History: No date: Anemia     Comment:  Iron deficiency IRON INFUSIONS X 5 WEEKS No date: Anxiety No date: Arthritis No date: Asthma 2017: Bronchitis No date: Complication of anesthesia No date: Crohn's disease ([place]) No date: Depression No date: Diabetes mellitus without complication (HCC) No date: Endometriosis No date: Fibromyalgia No date: Gastric ulcer No date: GERD (gastroesophageal reflux disease) No date: Herniated disc, cervical No date: Hyperlipemia No date: Intestinal obstruction (H[facility]) No date: Migraine     Comment:  CHRONIC No date: Nausea     Comment:  CHRONIC No date: Neuropathy No date: Pain     Comment:  BACK No date: Peristomal hernia No date: PONV (postoperative nausea and vomiting) No date: Ulcerative colitis  ([place])   Reproductive/Obstetrics                             Anesthesia Physical  Anesthesia Plan  ASA: III  Anesthesia Plan: General   Post-op Pain Management:    Induction: Intravenous  PONV Risk Score and Plan: 3 and Propofol infusion and TIVA  Airway Management Planned: Natural Airway and Nasal Cannula  Additional Equipment:   Intra-op Plan:   Post-operative Plan:   Informed Consent: I have reviewed the patients History and Physical, chart, labs and discussed the procedure including the risks, benefits and alternatives for the proposed anesthesia with the patient or authorized representative who has indicated his/her understanding and acceptance.     Dental advisory given  Plan Discussed with: CRNA and Anesthesiologist  Anesthesia Plan Comments:         Anesthesia Quick Evaluation

## 2019-01-27 ENCOUNTER — Encounter: Payer: Self-pay | Admitting: Internal Medicine

## 2019-01-27 ENCOUNTER — Ambulatory Visit: Payer: Medicare Other | Attending: Student | Admitting: Physical Therapy

## 2019-01-27 ENCOUNTER — Other Ambulatory Visit: Payer: Self-pay | Admitting: Student

## 2019-01-27 DIAGNOSIS — R278 Other lack of coordination: Secondary | ICD-10-CM | POA: Insufficient documentation

## 2019-01-27 DIAGNOSIS — M79671 Pain in right foot: Secondary | ICD-10-CM | POA: Insufficient documentation

## 2019-01-27 DIAGNOSIS — M6281 Muscle weakness (generalized): Secondary | ICD-10-CM | POA: Insufficient documentation

## 2019-01-27 DIAGNOSIS — R2689 Other abnormalities of gait and mobility: Secondary | ICD-10-CM | POA: Insufficient documentation

## 2019-01-27 DIAGNOSIS — Y839 Surgical procedure, unspecified as the cause of abnormal reaction of the patient, or of later complication, without mention of misadventure at the time of the procedure: Secondary | ICD-10-CM

## 2019-01-27 DIAGNOSIS — R293 Abnormal posture: Secondary | ICD-10-CM | POA: Diagnosis present

## 2019-01-27 DIAGNOSIS — R1310 Dysphagia, unspecified: Secondary | ICD-10-CM

## 2019-01-27 NOTE — Patient Instructions (Addendum)
Open book on R side only ( lie on your L side)  See sheet   15 reps    Deep core level 1 ( see sheet)    ___  Minimize not straining stomach/ pelvic area  By  Avoid straining pelvic floor, abdominal muscles , spine  Use log rolling technique instead of getting out of bed with your neck or the sit-up     Log rolling into and out of bed   Log rolling into and out of bed If getting out of bed on R side, Bent knees, scoot hips/ shoulder to L  Raise R arm completely overhead, rolling onto armpit  Then lower bent knees to bed to get into complete side lying position  Then drop legs off bed, and push up onto R elbow/forearm, and use L hand to push onto the bed   ___  Sleep with pillow between knees if on your side

## 2019-01-28 ENCOUNTER — Ambulatory Visit
Admission: RE | Admit: 2019-01-28 | Discharge: 2019-01-28 | Disposition: A | Discharge status: Home / Self Care | Payer: Medicare Other | Source: Ambulatory Visit | Attending: Student | Admitting: Student

## 2019-01-28 ENCOUNTER — Other Ambulatory Visit: Payer: Self-pay

## 2019-01-28 DIAGNOSIS — R1310 Dysphagia, unspecified: Secondary | ICD-10-CM | POA: Insufficient documentation

## 2019-01-28 DIAGNOSIS — Y839 Surgical procedure, unspecified as the cause of abnormal reaction of the patient, or of later complication, without mention of misadventure at the time of the procedure: Secondary | ICD-10-CM | POA: Insufficient documentation

## 2019-01-28 LAB — SURGICAL PATHOLOGY

## 2019-01-28 NOTE — Therapy (Addendum)
[place] MAIN [facility] SERVICES [address] [place], [place], [address] Phone: [phone]   Fax:  [phone]  Physical Therapy Evaluation  Patient Details  Name: Caroline Hughes MRN: [id number] Date of Birth: 02/05/1961 Referring Provider (PT): Tammi Klippel    Encounter Date: 01/27/2019    Past Medical History:  Diagnosis Date  . Anemia    Iron deficiency IRON INFUSIONS X 5 WEEKS  . Anxiety   . Arthritis   . Asthma   . Bronchitis 2017  . Complication of anesthesia   . Crohn's disease ([place])   . Depression   . Diabetes mellitus without complication ([place])   . Endometriosis   . Fibromyalgia   . Gastric ulcer   . GERD (gastroesophageal reflux disease)   . Herniated disc, cervical   . Hyperlipemia   . Intestinal obstruction ([place])   . Migraine    CHRONIC  . Nausea    CHRONIC  . Neuropathy   . Pain    BACK  . Peristomal hernia   . PONV (postoperative nausea and vomiting)   . Ulcerative colitis [facility])     Past Surgical History:  Procedure Laterality Date  . ABDOMINAL HYSTERECTOMY    . CATARACT EXTRACTION W/PHACO Right 11/25/2016   Procedure: CATARACT EXTRACTION PHACO AND INTRAOCULAR LENS PLACEMENT (IOC);  Surgeon: Birder Robson, MD;  Location: [facility] ORS;  Service: Ophthalmology;  Laterality: Right;  [place] 00:40 AP% 17.4 CDE 6.94 Fluid pack lot # [id number]  . CATARACT EXTRACTION W/PHACO Left 12/02/2016   Procedure: CATARACT EXTRACTION PHACO AND INTRAOCULAR LENS PLACEMENT ([place]);  Surgeon: Birder Robson, MD;  Location: [facility] ORS;  Service: Ophthalmology;  Laterality: Left;  [place] 00:34 AP% 14.2 CDE 4.94 Flyuid pack lot [id number]  . CHOLECYSTECTOMY    . COLON RESECTION    . ESOPHAGOGASTRODUODENOSCOPY (EGD) WITH PROPOFOL N/A 06/17/2017   Procedure: ESOPHAGOGASTRODUODENOSCOPY (EGD) WITH PROPOFOL;  Surgeon: [name], MD;  Location: [facility] ENDOSCOPY;  Service: Gastroenterology;  Laterality: N/A;  . ESOPHAGOGASTRODUODENOSCOPY  (EGD) WITH PROPOFOL N/A 06/19/2017   Procedure: ESOPHAGOGASTRODUODENOSCOPY (EGD) WITH PROPOFOL;  Surgeon: [name], MD;  Location: [facility] ENDOSCOPY;  Service: Gastroenterology;  Laterality: N/A;  . ESOPHAGOGASTRODUODENOSCOPY (EGD) WITH PROPOFOL N/A 01/26/2019   Procedure: ESOPHAGOGASTRODUODENOSCOPY (EGD) WITH PROPOFOL;  Surgeon: Toledo, Benay Pike, MD;  Location: [facility] ENDOSCOPY;  Service: Gastroenterology;  Laterality: N/A;  . HERNIA REPAIR     MULTIPLE PERISTOMAL  . ILEOSCOPY N/A 06/17/2017   Procedure: ILEOSCOPY THROUGH STOMA;  Surgeon: [name], MD;  Location: [facility] ENDOSCOPY;  Service: Gastroenterology;  Laterality: N/A;  . ILEOSCOPY N/A 06/23/2017   Procedure: ILEOSCOPY THROUGH STOMA;  Surgeon: Lin Landsman, MD;  Location: [facility] ENDOSCOPY;  Service: Gastroenterology;  Laterality: N/A;  . ILEOSTOMY Bilateral    MOVED X 3  . rectal fistula packing     X 3    There were no vitals filed for this visit. Subjective Assessment - 01/27/19 0908    Subjective 1) Rectal spasm with sitting > 20 min. 10/10. Eases after hours.   Pt had undergone Pelvic PT in [place] 5 years ago and got better but stopped doing the exercsies.  No issues with urination.    2) Hernias ( 1 near ileostomy , 1 near midline, 1 near L low ab) . Activities that involve straining/pressure to abdomen: no lifting > 8 lbs  Stair climbing is limited due MD orders and pain. 3 STE at home no rails.      3) R plantar fasciitis ,  treated with boot 1.5 years ago but not better. Pt put off surgery. Walking 2-3 miles with anle sock and has not issues on the foot. Pt does stretches. Pt notices she bumped into the doorway on the L side of her body and that she sometimes she is leaning on her R. Pt sees herself in the mirror leaning to the R. Stair climbing is limited due MD orders and pain. 3 STE at home no rails.         Pertinent History  Abdominal hysterectomy, Cholecystectomy, Hernia repair, Chrohns,  After ulcer colitis episode 1982  which led to ileostomy, pt underwent emergency procudures due to hemorrhaging and had colon resection and 2 years later  rectal fistula packing    Limitations  House hold activities    Patient Stated Goals  deal with pelvic issues         [facility] PT Assessment - 01/27/19 0932      Assessment   Medical Diagnosis  rectal spasm    Referring Provider (PT)  Tammi Klippel       Precautions   Precautions  Other (comment)    Precaution Comments  no heavy lifting > 8 lbs       Restrictions   Weight Bearing Restrictions  No      Balance Screen   Has the patient fallen in the past 6 months  No      Observation/Other Assessments   Observations   Coordination  R ASIS to malleoli 86 cm, L 85 cm  pre Tx, post Tx 85 cm    Pelvic floor contraction with overuse of gluts.  Limited diaphragmatic excursion /depression      Strength   Overall Strength Comments  BLE hip/knee flex/ext 4+/5 , R hip abd 3+/5, L hip 4+/5        Palpation   Spinal mobility  R trunk rotation < L     SI assessment   R iliac crest, pole of patella higher, R shoulder lower    Palpation comment  R interspinal with flinching pain, tightness noted       Bed Mobility   Bed Mobility  --   headcrunch, can scoot/bridge     Ambulation/Gait   Gait Comments  limited L trunk rotation, R hip higher, R trunk sideflexion   ( post Tx: more L trunk rotation, no more R sideflexion)                Objective measurements completed on examination: See above findings.    Pelvic Floor Special Questions - 01/27/19 0956    External Perineal Exam  through clothing: fliching pain on R coccygeus, anterior triangle > L ( post Tx: no tightness, no pain)                      PT Long Term Goals - 01/27/19 1208      PT LONG TERM GOAL #1   Title  Pt will decrease her [place] score from 50% to < 25% in order to improve QOL    Time  10    Period  Weeks    Status  New    Target Date  04/07/19      PT LONG TERM GOAL  #2   Title  Pt will demo  proper deep core coordination without chest breathing and optimal excursion of diaphragm/pelvic floor in order to promote intraabdominal pressure for digestion/ swallowing    Period  Weeks  Status  New    Target Date  02/10/19      PT LONG TERM GOAL #3   Title  Pt will demo proper alignment and technique with sitting, logrolling, sit to stand, lifting, body mechanics with ADLs to minimize straining of abdominopelvic floor mm and spine, lowering risk for worsening hernias.    Time  8    Period  Weeks    Status  New    Target Date  03/24/19      PT LONG TERM GOAL #4   Title  Pt will demo IND with resistance band exercises and deep core strengthening exercises to promote improve abdominal / deep core strength    Time  5    Period  Weeks    Status  New    Target Date  03/03/19        Plan - 01/31/19 0825    Clinical Impression Pt is a 58 yo female who experiences rectal spasms, has multiple abdominal hernias, and R foot pain. Contributing factors to her rectal spasms and abdominal hernias include: abdominal hysterectomy, cholecystectomy, Hernia repair, Chrohns,  ulcer colitis episode 1982 which led to ileostomy, pt underwent emergency procudures due to hemorrhaging and had colon resection and 2 years later rectal fistula packing.   Clinical assessment today showed weakness/dyscoordination  of deep core and pelvic floor mm, presence of colostomy bag, pelvic obliquities, limited spinal rotation R, higher R iliac crest, lower R shoulder, hip mm weakness B, and gait deviations. Pt also showed poor understanding and body mechanics to minimize straining of abdomen to minimize risk of worsening of abdominal hernias. Following Tx today, pt's iliac crest alignment was restored and her gait pattern  showed more L trunk rotation, no more R sideflexion. Pt also showed improved body mechanics to supine to sit without straining abdomen post training. Plan to apply  Regional  interdependent approach to yield greater outcomes. Suspect pt's R foot pain ( plantar fasciitis) is possibly linked to pelvic obliquities. Plan to assess feet next session. Further pelvic floor assessment will be applied after pelvic alignment has been maintained. Deep core and global mm strengthening around abdomen will begin at next session.  Pt will benefit from skilled pelvic health PT.      Personal Factors and Comorbidities  Comorbidity 3+    Examination-Activity Limitations  Sit    Stability/Clinical Decision Making  Evolving/Moderate complexity    Rehab Potential  Good    PT Frequency  1x / week    PT Duration  --   10   PT Treatment/Interventions  ADLs/Self Care Home Management;Moist Heat;Gait training;Neuromuscular re-education;Manual techniques;Therapeutic activities;Therapeutic exercise;Patient/family education;Balance training;Scar mobilization;Energy conservation;Manual lymph drainage;Taping    Consulted and Agree with Plan of Care  Patient       Patient will benefit from skilled therapeutic intervention in order to improve the following deficits and impairments:  Abnormal gait, Improper body mechanics, Pain, Postural dysfunction, Increased muscle spasms, Decreased scar mobility, Decreased mobility, Decreased coordination, Decreased range of motion, Decreased endurance, Decreased strength, Difficulty walking  Visit Diagnosis: Other abnormalities of gait and mobility  Other lack of coordination  Pain in right foot  Muscle weakness (generalized)     Problem List Patient Active Problem List   Diagnosis Date Noted  . Essential hypertension 07/24/2017  . Hyperlipidemia, mixed 07/24/2017  . Crohn disease ([place]) 06/15/2017  . GERD (gastroesophageal reflux disease) 06/15/2017  . Melena 06/15/2017  . Palpitations 04/20/2017  . History of duodenal ulcer 03/03/2017  . Pancreatic  insufficiency 03/03/2017  . DDD (degenerative disc disease), cervical 02/18/2017  . Spondylosis  of cervical region without myelopathy or radiculopathy 02/18/2017  . Chronic pain syndrome 02/18/2017  . Cervical myofascial pain syndrome 01/21/2017  . Irritable bowel syndrome with both constipation and diarrhea 11/12/2016  . Osteoarthritis 10/09/2016  . Trigger middle finger of left hand 10/09/2016  . Controlled type 2 diabetes mellitus with diabetic nephropathy, with long-term current use of insulin ([place]) 07/09/2016  . Iron deficiency anemia 02/18/2016  . Atopic dermatitis 11/19/2015  . Chronic urticaria 11/19/2015  . Dysphagia, unspecified 11/19/2015  . Vitamin D deficiency, unspecified 07/25/2015  . Fibromyalgia 05/03/2015  . H/O osteopenia 05/03/2015  . Hyperlipidemia 08/10/2014  . Piriformis syndrome, right 07/27/2014  . Shin splint, right, initial encounter 07/27/2014  . SI (sacroiliac) joint dysfunction 07/27/2014  . Intractable migraine without status migrainosus 06/28/2014  . 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[place]) 06/28/2014  . Uncontrolled type 2 diabetes mellitus ([place]) 06/28/2014  . Long-term insulin use ([place]) 04/17/2014  . Peripheral polyneuropathy 04/17/2014  . Mild persistent asthma 03/29/2014  . Advance directive on file 07/28/2013  . Refusal of blood transfusions as patient is Jehovah's Witness 07/28/2013  . Bladder pain 01/19/2013  . Incomplete emptying of bladder 01/19/2013  . Pelvic floor dysfunction 01/19/2013  . Anxiety and depression 09/24/2012  . COPD (chronic obstructive pulmonary disease) ([place]) 09/24/2012  . Diarrhea 08/17/2012  . 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  . Chronic cystitis 05/03/2012  . Other specified symptom associated with female genital organs 05/03/2012    Jerl Mina ,PT, DPT, E-RYT  01/31/2019, 8:25 AM  [place] MAIN [facility] SERVICES [address] [place], [place], [address] Phone: [phone]   Fax:  [phone]  Name: ROBERTINE KIPPER MRN: [id number] Date of Birth: 06-Apr-1961

## 2019-01-31 NOTE — Addendum Note (Signed)
Addended by: Jerl Mina on: 01/31/2019 08:42 AM   Modules accepted: Orders

## 2019-02-02 ENCOUNTER — Other Ambulatory Visit: Payer: Self-pay

## 2019-02-02 ENCOUNTER — Ambulatory Visit: Payer: Medicare Other | Admitting: Physical Therapy

## 2019-02-02 DIAGNOSIS — R2689 Other abnormalities of gait and mobility: Secondary | ICD-10-CM

## 2019-02-02 DIAGNOSIS — M79671 Pain in right foot: Secondary | ICD-10-CM

## 2019-02-02 DIAGNOSIS — M6281 Muscle weakness (generalized): Secondary | ICD-10-CM

## 2019-02-02 DIAGNOSIS — R278 Other lack of coordination: Secondary | ICD-10-CM

## 2019-02-02 NOTE — Patient Instructions (Addendum)
Add R back stretch     Reclined twist  Lay on your back, knees bend Scoot hips to the R , leave shoulders in place Drop knees to the L side resting onto pillows to keep leg at the same width of hips Pillow under L thigh to minimize too much strain  Breaths - 5 breaths 3 sets 2x day _____  During the day  Wall stretch. . R hand stretched up and lengthening up like reaching to stretch R side body, 20 reps     _____  Strengthening with magenta band      Oblique/ scapula stabilization   Opposite arm   Place band in "U"    band under ballmounds  while laying on back w/ knees bent     20 reps  on each side  Holding band from opposite thigh,  Inhale,    exhale then pull band across body while keeping elbow , shoulders, back of the head pressed down    X 2 day  ______________  Zigzag walking 3 min x 2 slow speed  With rest break and stretches afterwards

## 2019-02-02 NOTE — Therapy (Signed)
[place] MAIN [facility] SERVICES [address] [place], [place], [address] Phone: [phone]   Fax:  [phone]  Physical Therapy Treatment  Patient Details  Name: Caroline Hughes MRN: [id number] Date of Birth: 10-29-1960 Referring Provider (PT): Tammi Klippel    Encounter Date: 02/02/2019  PT End of Session - 02/02/19 1353    Visit Number  2    Number of Visits  10    Date for PT Re-Evaluation  04/07/19    PT Start Time  5732    PT Stop Time  1348    PT Time Calculation (min)  45 min    Activity Tolerance  Patient tolerated treatment well       Past Medical History:  Diagnosis Date  . Anemia    Iron deficiency IRON INFUSIONS X 5 WEEKS  . Anxiety   . Arthritis   . Asthma   . Bronchitis 2017  . Complication of anesthesia   . Crohn's disease ([place])   . Depression   . Diabetes mellitus without complication ([place])   . Endometriosis   . Fibromyalgia   . Gastric ulcer   . GERD (gastroesophageal reflux disease)   . Herniated disc, cervical   . Hyperlipemia   . Intestinal obstruction ([place])   . Migraine    CHRONIC  . Nausea    CHRONIC  . Neuropathy   . Pain    BACK  . Peristomal hernia   . PONV (postoperative nausea and vomiting)   . Ulcerative colitis [facility])     Past Surgical History:  Procedure Laterality Date  . ABDOMINAL HYSTERECTOMY    . CATARACT EXTRACTION W/PHACO Right 11/25/2016   Procedure: CATARACT EXTRACTION PHACO AND INTRAOCULAR LENS PLACEMENT (IOC);  Surgeon: Birder Robson, MD;  Location: [facility] ORS;  Service: Ophthalmology;  Laterality: Right;  [place] 00:40 AP% 17.4 CDE 6.94 Fluid pack lot # [id number]  . CATARACT EXTRACTION W/PHACO Left 12/02/2016   Procedure: CATARACT EXTRACTION PHACO AND INTRAOCULAR LENS PLACEMENT ([place]);  Surgeon: Birder Robson, MD;  Location: [facility] ORS;  Service: Ophthalmology;  Laterality: Left;  [place] 00:34 AP% 14.2 CDE 4.94 Flyuid pack lot 3 [id number]  . CHOLECYSTECTOMY    . COLON  RESECTION    . ESOPHAGOGASTRODUODENOSCOPY (EGD) WITH PROPOFOL N/A 06/17/2017   Procedure: ESOPHAGOGASTRODUODENOSCOPY (EGD) WITH PROPOFOL;  Surgeon: [name], MD;  Location: [facility] ENDOSCOPY;  Service: Gastroenterology;  Laterality: N/A;  . ESOPHAGOGASTRODUODENOSCOPY (EGD) WITH PROPOFOL N/A 06/19/2017   Procedure: ESOPHAGOGASTRODUODENOSCOPY (EGD) WITH PROPOFOL;  Surgeon: [name], MD;  Location: [facility] ENDOSCOPY;  Service: Gastroenterology;  Laterality: N/A;  . ESOPHAGOGASTRODUODENOSCOPY (EGD) WITH PROPOFOL N/A 01/26/2019   Procedure: ESOPHAGOGASTRODUODENOSCOPY (EGD) WITH PROPOFOL;  Surgeon: Toledo, Benay Pike, MD;  Location: [facility] ENDOSCOPY;  Service: Gastroenterology;  Laterality: N/A;  . HERNIA REPAIR     MULTIPLE PERISTOMAL  . ILEOSCOPY N/A 06/17/2017   Procedure: ILEOSCOPY THROUGH STOMA;  Surgeon: [name], MD;  Location: [facility] ENDOSCOPY;  Service: Gastroenterology;  Laterality: N/A;  . ILEOSCOPY N/A 06/23/2017   Procedure: ILEOSCOPY THROUGH STOMA;  Surgeon: Lin Landsman, MD;  Location: [facility] ENDOSCOPY;  Service: Gastroenterology;  Laterality: N/A;  . ILEOSTOMY Bilateral    MOVED X 3  . rectal fistula packing     X 3    There were no vitals filed for this visit.  Subjective Assessment - 02/02/19 1304    Subjective  Pt had an endoscopy and she had a bad reaction to it and she is not over it. Her throat  inflamed. Pt is not sure if she is doing deep core level 1 correctly.    Pertinent History  Abdominal hysterectomy, Cholecystectomy, Hernia repair, Chrohns,  After ulcer colitis episode 1982 which led to Ileostomy, pt underwent emergency procudures due to hemorrhaging and had colon resection and 2 years later  rectal fistula packing    Limitations  House hold activities    Patient Stated Goals  deal with pelvic issues         [facility] PT Assessment - 02/02/19 1306      Palpation   Spinal mobility  QL tight on R, ( post Tx released)    SI assessment   Iliac crest levelled, patella  levelled . R shoulder lowered    Palpation comment  paraspinal tightness decreasd compared to last session, no pain with palpation                    [facility] Adult PT Treatment/Exercise - 02/02/19 1403      Neuro Re-ed    Neuro Re-ed Details   cued for new HEP       Modalities   Modalities  Moist Heat      Moist Heat Therapy   Number Minutes Moist Heat  10 Minutes    Moist Heat Location  --   thoracic     Manual Therapy   Manual therapy comments  STM/MWM at thoracic / medial scap R                   PT Long Term Goals - 01/27/19 1208      PT LONG TERM GOAL #1   Title  Pt will decrease her [place] score from 50% to < 25% in order to improve QOL    Time  10    Period  Weeks    Status  New    Target Date  04/07/19      PT LONG TERM GOAL #2   Title  Pt will demo  proper deep core coordination without chest breathing and optimal excursion of diaphragm/pelvic floor in order to promote intraabdominal pressure for digestion/ swallowing    Period  Weeks    Status  New    Target Date  02/10/19      PT LONG TERM GOAL #3   Title  Pt will demo proper alignment and technique with sitting, logrolling, sit to stand, lifting, body mechanics with ADLs to minimize straining of abdominopelvic floor mm and spine, lowering risk for worsening hernias.    Time  8    Period  Weeks    Status  New    Target Date  03/24/19      PT LONG TERM GOAL #4   Title  Pt will demo IND with resistance band exercises and deep core strengthening exercises to promote improve abdominal / deep core strength    Time  5    Period  Weeks    Status  New    Target Date  03/03/19            Plan - 02/02/19 1353    Clinical Impression Statement  Pt demo'd increased thoracic extension which is good carry over from last session. Addressed R flank weakness with global mm strengthening with resistance band exercise. Pt tolerated exercise without complaints. Also added R flank stretch and  funcational hip abduction strengthening into new HEP. Pt continues to benefit from skilled PT.    Personal Factors and Comorbidities  Comorbidity 3+  Examination-Activity Limitations  Sit    Stability/Clinical Decision Making  Evolving/Moderate complexity    Rehab Potential  Good    PT Frequency  1x / week    PT Duration  --   10   PT Treatment/Interventions  ADLs/Self Care Home Management;Moist Heat;Gait training;Neuromuscular re-education;Manual techniques;Therapeutic activities;Therapeutic exercise;Patient/family education;Balance training;Scar mobilization;Energy conservation;Manual lymph drainage;Taping    Consulted and Agree with Plan of Care  Patient       Patient will benefit from skilled therapeutic intervention in order to improve the following deficits and impairments:  Abnormal gait, Improper body mechanics, Pain, Postural dysfunction, Increased muscle spasms, Decreased scar mobility, Decreased mobility, Decreased coordination, Decreased range of motion, Decreased endurance, Decreased strength, Difficulty walking  Visit Diagnosis: Other abnormalities of gait and mobility  Other lack of coordination  Pain in right foot  Muscle weakness (generalized)     Problem List Patient Active Problem List   Diagnosis Date Noted  . Essential hypertension 07/24/2017  . Hyperlipidemia, mixed 07/24/2017  . Crohn disease ([place]) 06/15/2017  . GERD (gastroesophageal reflux disease) 06/15/2017  . Melena 06/15/2017  . Palpitations 04/20/2017  . History of duodenal ulcer 03/03/2017  . Pancreatic insufficiency 03/03/2017  . DDD (degenerative disc disease), cervical 02/18/2017  . Spondylosis of cervical region without myelopathy or radiculopathy 02/18/2017  . Chronic pain syndrome 02/18/2017  . Cervical myofascial pain syndrome 01/21/2017  . Irritable bowel syndrome with both constipation and diarrhea 11/12/2016  . Osteoarthritis 10/09/2016  . Trigger middle finger of left hand  10/09/2016  . Controlled type 2 diabetes mellitus with diabetic nephropathy, with long-term current use of insulin ([place]) 07/09/2016  . Iron deficiency anemia 02/18/2016  . Atopic dermatitis 11/19/2015  . Chronic urticaria 11/19/2015  . Dysphagia, unspecified 11/19/2015  . Vitamin D deficiency, unspecified 07/25/2015  . Fibromyalgia 05/03/2015  . H/O osteopenia 05/03/2015  . Hyperlipidemia 08/10/2014  . Piriformis syndrome, right 07/27/2014  . Shin splint, right, initial encounter 07/27/2014  . SI (sacroiliac) joint dysfunction 07/27/2014  . Intractable migraine without status migrainosus 06/28/2014  . 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[place]) 06/28/2014  . Uncontrolled type 2 diabetes mellitus ([place]) 06/28/2014  . Long-term insulin use ([place]) 04/17/2014  . Peripheral polyneuropathy 04/17/2014  . Mild persistent asthma 03/29/2014  . Advance directive on file 07/28/2013  . Refusal of blood transfusions as patient is Jehovah's Witness 07/28/2013  . Bladder pain 01/19/2013  . Incomplete emptying of bladder 01/19/2013  . Pelvic floor dysfunction 01/19/2013  . Anxiety and depression 09/24/2012  . COPD (chronic obstructive pulmonary disease) ([place]) 09/24/2012  . Diarrhea 08/17/2012  . 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  . Chronic cystitis 05/03/2012  . Other specified symptom associated with female genital organs 05/03/2012    Jerl Mina ,PT, DPT, E-RYT  02/02/2019, 2:05 PM  [place] MAIN [facility] SERVICES [address] [place], [place], [address] Phone: [phone]   Fax:  [phone]  Name: OCEANNA ARRUDA MRN: [id number] Date of Birth: 24-Dec-1960

## 2019-02-07 ENCOUNTER — Ambulatory Visit: Payer: Medicare Other | Admitting: Physical Therapy

## 2019-02-14 ENCOUNTER — Other Ambulatory Visit: Payer: Self-pay

## 2019-02-14 ENCOUNTER — Ambulatory Visit: Payer: Medicare Other | Admitting: Physical Therapy

## 2019-02-14 DIAGNOSIS — M79671 Pain in right foot: Secondary | ICD-10-CM

## 2019-02-14 DIAGNOSIS — R278 Other lack of coordination: Secondary | ICD-10-CM

## 2019-02-14 DIAGNOSIS — R293 Abnormal posture: Secondary | ICD-10-CM

## 2019-02-14 DIAGNOSIS — R2689 Other abnormalities of gait and mobility: Secondary | ICD-10-CM

## 2019-02-14 DIAGNOSIS — M6281 Muscle weakness (generalized): Secondary | ICD-10-CM

## 2019-02-14 NOTE — Patient Instructions (Addendum)
Make sure to have a pillow between your knees and two pilows under knees and not let your knees drop to the side as low   Angel wings up and bat wings down 10 reps while on the bed, keeping contact of arms on bed, dragging arms to move  Bear stretch at doorway to let corner of doorway massage inside shoulder blade  10 reps instead of using tennis balls

## 2019-02-14 NOTE — Therapy (Signed)
[place] MAIN [facility] SERVICES [address] [place], [place], [address] Phone: [phone]   Fax:  [phone]  Physical Therapy Treatment  Patient Details  Name: Caroline Hughes MRN: [id number] Date of Birth: [birth year] Referring Provider (PT): Tammi Klippel    Encounter Date: 02/14/2019  PT End of Session - 02/14/19 1212    Visit Number  3    Number of Visits  10    Date for PT Re-Evaluation  04/07/19    PT Start Time  1100    PT Stop Time  1207    PT Time Calculation (min)  67 min    Activity Tolerance  Patient tolerated treatment well       Past Medical History:  Diagnosis Date  . Anemia    Iron deficiency IRON INFUSIONS X 5 WEEKS  . Anxiety   . Arthritis   . Asthma   . Bronchitis 2017  . Complication of anesthesia   . Crohn's disease ([place])   . Depression   . Diabetes mellitus without complication ([place])   . Endometriosis   . Fibromyalgia   . Gastric ulcer   . GERD (gastroesophageal reflux disease)   . Herniated disc, cervical   . Hyperlipemia   . Intestinal obstruction ([place])   . Migraine    CHRONIC  . Nausea    CHRONIC  . Neuropathy   . Pain    BACK  . Peristomal hernia   . PONV (postoperative nausea and vomiting)   . Ulcerative colitis [facility])     Past Surgical History:  Procedure Laterality Date  . ABDOMINAL HYSTERECTOMY    . CATARACT EXTRACTION W/PHACO Right 11/25/2016   Procedure: CATARACT EXTRACTION PHACO AND INTRAOCULAR LENS PLACEMENT (IOC);  Surgeon: Birder Robson, MD;  Location: [facility] ORS;  Service: Ophthalmology;  Laterality: Right;  [place] 00:40 AP% 17.4 CDE 6.94 Fluid pack lot # [id number]  . CATARACT EXTRACTION W/PHACO Left 12/02/2016   Procedure: CATARACT EXTRACTION PHACO AND INTRAOCULAR LENS PLACEMENT ([place]);  Surgeon: Birder Robson, MD;  Location: [facility] ORS;  Service: Ophthalmology;  Laterality: Left;  [place] 00:34 AP% 14.2 CDE 4.94 Flyuid pack lot 3 [id number]  . CHOLECYSTECTOMY    . COLON  RESECTION    . ESOPHAGOGASTRODUODENOSCOPY (EGD) WITH PROPOFOL N/A 06/17/2017   Procedure: ESOPHAGOGASTRODUODENOSCOPY (EGD) WITH PROPOFOL;  Surgeon: [name], MD;  Location: [facility] ENDOSCOPY;  Service: Gastroenterology;  Laterality: N/A;  . ESOPHAGOGASTRODUODENOSCOPY (EGD) WITH PROPOFOL N/A 06/19/2017   Procedure: ESOPHAGOGASTRODUODENOSCOPY (EGD) WITH PROPOFOL;  Surgeon: [name], MD;  Location: [facility] ENDOSCOPY;  Service: Gastroenterology;  Laterality: N/A;  . ESOPHAGOGASTRODUODENOSCOPY (EGD) WITH PROPOFOL N/A 01/26/2019   Procedure: ESOPHAGOGASTRODUODENOSCOPY (EGD) WITH PROPOFOL;  Surgeon: Toledo, Benay Pike, MD;  Location: [facility] ENDOSCOPY;  Service: Gastroenterology;  Laterality: N/A;  . HERNIA REPAIR     MULTIPLE PERISTOMAL  . ILEOSCOPY N/A 06/17/2017   Procedure: ILEOSCOPY THROUGH STOMA;  Surgeon: [name], MD;  Location: [facility] ENDOSCOPY;  Service: Gastroenterology;  Laterality: N/A;  . ILEOSCOPY N/A 06/23/2017   Procedure: ILEOSCOPY THROUGH STOMA;  Surgeon: Lin Landsman, MD;  Location: [facility] ENDOSCOPY;  Service: Gastroenterology;  Laterality: N/A;  . ILEOSTOMY Bilateral    MOVED X 3  . rectal fistula packing     X 3    There were no vitals filed for this visit.  Subjective Assessment - 02/14/19 1109    Subjective  Pt reports her rectal spasms are better and she has noticed when she puts her hinny back and it helps  her to sit 30-40 min at a time.  Her R foot has not been bothering her as much since last session.  Pt felt good after last session and is doing the band exercises.  Pt feels the twisting exericse is aggravating her R hip and is not sure if she is doing it right. Pt skipped last week because her an infection in her esophagus ( thursh and infection).    Pertinent History  Abdominal hysterectomy, Cholecystectomy, Hernia repair, Chrohns,  After ulcer colitis episode 1982 which led to Ileostomy, pt underwent emergency procudures due to hemorrhaging and had colon resection and 2  years later  rectal fistula packing    Limitations  House hold activities    Patient Stated Goals  deal with pelvic issues         [facility] PT Assessment - 02/14/19 1116      Observation/Other Assessments   Observations  less dowagers hump, more upright posture       Palpation   Spinal mobility  no more tightness at QL     Palpation comment  signficant tightness at posterior ribs, medial scap mm L  and R                    [facility] Adult PT Treatment/Exercise - 02/14/19 1208      Neuro Re-ed    Neuro Re-ed Details   scapular depression cues for more throacic extension to promote less forward posture       Modalities   Modalities  Moist Heat      Moist Heat Therapy   Number Minutes Moist Heat  5 Minutes    Moist Heat Location  Other (comment)   thoracic, shoulder      Manual Therapy   Manual therapy comments  STMW/MWM, jostling at posterior ribs, medial scap mm L  and R                   PT Long Term Goals - 02/14/19 1118      PT LONG TERM GOAL #1   Title  Pt will decrease her [place] score from 50% to < 25% in order to improve QOL    Time  10    Period  Weeks    Status  On-going      PT LONG TERM GOAL #2   Title  Pt will demo  proper deep core coordination without chest breathing and optimal excursion of diaphragm/pelvic floor in order to promote intraabdominal pressure for digestion/ swallowing    Period  Weeks    Status  Achieved      PT LONG TERM GOAL #3   Title  Pt will demo proper alignment and technique with sitting, logrolling, sit to stand, lifting, body mechanics with ADLs to minimize straining of abdominopelvic floor mm and spine, lowering risk for worsening hernias.    Time  8    Period  Weeks    Status  Achieved      PT LONG TERM GOAL #4   Title  Pt will demo IND with resistance band exercises and deep core strengthening exercises to promote improve abdominal / deep core strength    Time  5    Period  Weeks    Status  On-going             Plan - 02/14/19 1214    Clinical Impression Statement  Pt is making great progress with good carry over with pelvic girdle alignment and increased  deep core strength. Pt also showed propioception for anterior pelvic tilt which has helped her to sit for more than 20 min to 30-40 min without rectal spasms. R foot pain has resolved. Back mm tightness is decreasing but required more manual treatment today to minimize thoracic kyphosis which will help with better cervical alignment over thorax.  Plan to refer to pt to SLP as she is having esophagus issues with her Hx of ileotomy and colon resection.  Pt continues to benefit from skilled PT to ensure proper spinal alignment for mobility, motility.     Personal Factors and Comorbidities  Comorbidity 3+    Examination-Activity Limitations  Sit;Other;Self Feeding    Stability/Clinical Decision Making  Evolving/Moderate complexity    Rehab Potential  Good    PT Frequency  1x / week    PT Duration  --   10   PT Treatment/Interventions  ADLs/Self Care Home Management;Moist Heat;Gait training;Neuromuscular re-education;Manual techniques;Therapeutic activities;Therapeutic exercise;Patient/family education;Balance training;Scar mobilization;Energy conservation;Manual lymph drainage;Taping    Consulted and Agree with Plan of Care  Patient       Patient will benefit from skilled therapeutic intervention in order to improve the following deficits and impairments:  Abnormal gait, Improper body mechanics, Pain, Postural dysfunction, Increased muscle spasms, Decreased scar mobility, Decreased mobility, Decreased coordination, Decreased range of motion, Decreased endurance, Decreased strength, Difficulty walking  Visit Diagnosis: Other abnormalities of gait and mobility  Abnormal posture  Other lack of coordination  Pain in right foot  Muscle weakness (generalized)     Problem List Patient Active Problem List   Diagnosis Date Noted  .  Essential hypertension 07/24/2017  . Hyperlipidemia, mixed 07/24/2017  . Crohn disease ([place]) 06/15/2017  . GERD (gastroesophageal reflux disease) 06/15/2017  . Melena 06/15/2017  . Palpitations 04/20/2017  . History of duodenal ulcer 03/03/2017  . Pancreatic insufficiency 03/03/2017  . DDD (degenerative disc disease), cervical 02/18/2017  . Spondylosis of cervical region without myelopathy or radiculopathy 02/18/2017  . Chronic pain syndrome 02/18/2017  . Cervical myofascial pain syndrome 01/21/2017  . Irritable bowel syndrome with both constipation and diarrhea 11/12/2016  . Osteoarthritis 10/09/2016  . Trigger middle finger of left hand 10/09/2016  . Controlled type 2 diabetes mellitus with diabetic nephropathy, with long-term current use of insulin ([place]) 07/09/2016  . Iron deficiency anemia 02/18/2016  . Atopic dermatitis 11/19/2015  . Chronic urticaria 11/19/2015  . Dysphagia, unspecified 11/19/2015  . Vitamin D deficiency, unspecified 07/25/2015  . Fibromyalgia 05/03/2015  . H/O osteopenia 05/03/2015  . Hyperlipidemia 08/10/2014  . Piriformis syndrome, right 07/27/2014  . Shin splint, right, initial encounter 07/27/2014  . SI (sacroiliac) joint dysfunction 07/27/2014  . Intractable migraine without status migrainosus 06/28/2014  . 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[place]) 06/28/2014  . Uncontrolled type 2 diabetes mellitus ([place]) 06/28/2014  . Long-term insulin use ([place]) 04/17/2014  . Peripheral polyneuropathy 04/17/2014  . Mild persistent asthma 03/29/2014  . Advance directive on file 07/28/2013  . Refusal of blood transfusions as patient is Jehovah's Witness 07/28/2013  . Bladder pain 01/19/2013  . Incomplete emptying of bladder 01/19/2013  . Pelvic floor dysfunction 01/19/2013  . Anxiety and depression 09/24/2012  . COPD (chronic obstructive pulmonary disease) ([place]) 09/24/2012  . Diarrhea 08/17/2012  . 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  . Chronic cystitis 05/03/2012  . Other specified symptom  associated with female genital organs 05/03/2012    Jerl Mina ,PT, DPT, E-RYT  02/14/2019, 1:04 PM  [place]  [place], [place], [address] Phone: [phone]   Fax:  [phone]  Name: VIVIANE SEMIDEY MRN: [id number] Date of Birth: 01-Dec-1960

## 2019-02-21 ENCOUNTER — Encounter: Payer: Medicare Other | Admitting: Physical Therapy

## 2019-02-23 ENCOUNTER — Ambulatory Visit: Payer: Medicare Other | Attending: Student | Admitting: Physical Therapy

## 2019-02-23 DIAGNOSIS — R278 Other lack of coordination: Secondary | ICD-10-CM | POA: Insufficient documentation

## 2019-02-23 DIAGNOSIS — M79671 Pain in right foot: Secondary | ICD-10-CM | POA: Insufficient documentation

## 2019-02-23 DIAGNOSIS — R293 Abnormal posture: Secondary | ICD-10-CM | POA: Insufficient documentation

## 2019-02-23 DIAGNOSIS — R2689 Other abnormalities of gait and mobility: Secondary | ICD-10-CM | POA: Insufficient documentation

## 2019-02-23 DIAGNOSIS — M6281 Muscle weakness (generalized): Secondary | ICD-10-CM | POA: Insufficient documentation

## 2019-02-28 ENCOUNTER — Other Ambulatory Visit: Payer: Self-pay

## 2019-02-28 ENCOUNTER — Ambulatory Visit: Payer: Medicare Other | Admitting: Physical Therapy

## 2019-02-28 DIAGNOSIS — R278 Other lack of coordination: Secondary | ICD-10-CM

## 2019-02-28 DIAGNOSIS — R2689 Other abnormalities of gait and mobility: Secondary | ICD-10-CM | POA: Diagnosis present

## 2019-02-28 DIAGNOSIS — R293 Abnormal posture: Secondary | ICD-10-CM

## 2019-02-28 DIAGNOSIS — M79671 Pain in right foot: Secondary | ICD-10-CM

## 2019-02-28 DIAGNOSIS — M6281 Muscle weakness (generalized): Secondary | ICD-10-CM | POA: Diagnosis present

## 2019-02-28 NOTE — Patient Instructions (Addendum)
Reclined twist: Bring knees up higher to avoid the L hip issue   ___   Two pelvic floor stretches    Stretch for pelvic floor   "v - Bam"  heels slide away and then back toward buttocks and then rock knee to slight ,  slide heel along at 11 o clock away from buttocks   10 reps   _  R thigh cross over L thigh, towel under thighs to bring closer to chest  5 reps   ___  Wall lean for lengthening and strengthening R side   Forearm slide against wall as you stand perpendicular to wall, band under feet, feet hip width apart,  Opposite elbow by side, , imagine holding pencil under armpit as you lean toward wall, lowering forearm against the wall and opposite hand pulls band without letting elbow move away from side body  And slide back up , straightening body    Make sure the upper trapezius muscle does not hike up to ear as band is being pulled as you lean towards wall    band  10 x 2    Leaning L only to open the R flank area

## 2019-03-01 NOTE — Therapy (Deleted)
[place] MAIN [facility] SERVICES [address] [place], [place], [address] Phone: [phone]   Fax:  [phone]  Physical Therapy Treatment  Patient Details  Name: Caroline Hughes MRN: [id number] Date of Birth: [birth year] Referring Provider (PT): Tammi Klippel    Encounter Date: 02/28/2019  PT End of Session - 02/28/19 1119    Visit Number  4    Date for PT Re-Evaluation  04/07/19    PT Start Time  1109    PT Stop Time  1200    PT Time Calculation (min)  51 min    Activity Tolerance  Patient tolerated treatment well    Behavior During Therapy  [facility] for tasks assessed/performed       Past Medical History:  Diagnosis Date  . Anemia    Iron deficiency IRON INFUSIONS X 5 WEEKS  . Anxiety   . Arthritis   . Asthma   . Bronchitis 2017  . Complication of anesthesia   . Crohn's disease ([place])   . Depression   . Diabetes mellitus without complication ([place])   . Endometriosis   . Fibromyalgia   . Gastric ulcer   . GERD (gastroesophageal reflux disease)   . Herniated disc, cervical   . Hyperlipemia   . Intestinal obstruction ([place])   . Migraine    CHRONIC  . Nausea    CHRONIC  . Neuropathy   . Pain    BACK  . Peristomal hernia   . PONV (postoperative nausea and vomiting)   . Ulcerative colitis [facility])     Past Surgical History:  Procedure Laterality Date  . ABDOMINAL HYSTERECTOMY    . CATARACT EXTRACTION W/PHACO Right 11/25/2016   Procedure: CATARACT EXTRACTION PHACO AND INTRAOCULAR LENS PLACEMENT (IOC);  Surgeon: Birder Robson, MD;  Location: [facility] ORS;  Service: Ophthalmology;  Laterality: Right;  [place] 00:40 AP% 17.4 CDE 6.94 Fluid pack lot # [id number]  . CATARACT EXTRACTION W/PHACO Left 12/02/2016   Procedure: CATARACT EXTRACTION PHACO AND INTRAOCULAR LENS PLACEMENT ([place]);  Surgeon: Birder Robson, MD;  Location: [facility] ORS;  Service: Ophthalmology;  Laterality: Left;  [place] 00:34 AP% 14.2 CDE 4.94 Flyuid pack lot 3 [id number]   . CHOLECYSTECTOMY    . COLON RESECTION    . ESOPHAGOGASTRODUODENOSCOPY (EGD) WITH PROPOFOL N/A 06/17/2017   Procedure: ESOPHAGOGASTRODUODENOSCOPY (EGD) WITH PROPOFOL;  Surgeon: [name], MD;  Location: [facility] ENDOSCOPY;  Service: Gastroenterology;  Laterality: N/A;  . ESOPHAGOGASTRODUODENOSCOPY (EGD) WITH PROPOFOL N/A 06/19/2017   Procedure: ESOPHAGOGASTRODUODENOSCOPY (EGD) WITH PROPOFOL;  Surgeon: [name], MD;  Location: [facility] ENDOSCOPY;  Service: Gastroenterology;  Laterality: N/A;  . ESOPHAGOGASTRODUODENOSCOPY (EGD) WITH PROPOFOL N/A 01/26/2019   Procedure: ESOPHAGOGASTRODUODENOSCOPY (EGD) WITH PROPOFOL;  Surgeon: Toledo, Benay Pike, MD;  Location: [facility] ENDOSCOPY;  Service: Gastroenterology;  Laterality: N/A;  . HERNIA REPAIR     MULTIPLE PERISTOMAL  . ILEOSCOPY N/A 06/17/2017   Procedure: ILEOSCOPY THROUGH STOMA;  Surgeon: [name], MD;  Location: [facility] ENDOSCOPY;  Service: Gastroenterology;  Laterality: N/A;  . ILEOSCOPY N/A 06/23/2017   Procedure: ILEOSCOPY THROUGH STOMA;  Surgeon: Lin Landsman, MD;  Location: [facility] ENDOSCOPY;  Service: Gastroenterology;  Laterality: N/A;  . ILEOSTOMY Bilateral    MOVED X 3  . rectal fistula packing     X 3    There were no vitals filed for this visit.  Subjective Assessment - 02/28/19 1113    Subjective  Pt reports rectal spasms have improved by 80% and able to sit longer to 45 min compared  to 15 min, tilts her pelvis.  R foot pain has resolved and she walks without pain now for 45 min.  She also noticed her hernia pain is better and she is practicing the right way to get in/out of bed.    Pertinent History  Abdominal hysterectomy, Cholecystectomy, Hernia repair, Chrohns,  After ulcer colitis episode 1982 which led to Ileostomy, pt underwent emergency procudures due to hemorrhaging and had colon resection and 2 years later  rectal fistula packing    Limitations  House hold activities    Patient Stated Goals  deal with pelvic issues          [facility] PT Assessment - 03/01/19 1235      Observation/Other Assessments   Observations  required coorections for review of HEP       Palpation   Spinal mobility  R QL no longer tight. no thoracic tightness.  R flank weakness noted     SI assessment   levelled iliac crest                 Pelvic Floor Special Questions - 03/01/19 1236    External Perineal Exam  through clothing: no fliching pain on R coccygeus,  tightness obt int ( decreased postTx)         [facility] Adult PT Treatment/Exercise - 03/01/19 1236      Neuro Re-ed    Neuro Re-ed Details   reviewed HEP, correction for technique, guided mindfulenss technique for relaxation for better digestion/ management of comorbidities ( fibromyalgia, tensions in body, asthma)       Modalities   Modalities  Moist Heat      Moist Heat Therapy   Moist Heat Location  --   sacrum/perineum during guided relaxation     Manual Therapy   Manual therapy comments  STM/MWM R obt int                   PT Long Term Goals - 02/28/19 1116      PT LONG TERM GOAL #1   Title  Pt will decrease her [place] score from 50% to < 25% in order to improve QOL  ( 11/9: 17%)    Time  10    Period  Weeks    Status  Achieved      PT LONG TERM GOAL #2   Title  Pt will demo  proper deep core coordination without chest breathing and optimal excursion of diaphragm/pelvic floor in order to promote intraabdominal pressure for digestion/ swallowing    Period  Weeks    Status  Achieved      PT LONG TERM GOAL #3   Title  Pt will demo proper alignment and technique with sitting, logrolling, sit to stand, lifting, body mechanics with ADLs to minimize straining of abdominopelvic floor mm and spine, lowering risk for worsening hernias.    Time  8    Period  Weeks    Status  Achieved      PT LONG TERM GOAL #4   Title  Pt will demo IND with resistance band exercises and deep core strengthening exercises to promote improve abdominal / deep core  strength    Time  5    Period  Weeks    Status  Partially Met            Plan - 03/01/19 1237    Clinical Impression Statement  The following improvements deem pt ready to decrease her sessions to once a  month with one last session before d/c   Pt reports rectal spasms have improved by 80% and able to sit longer to 45 min compared to 15 min, tilts her pelvis.  R foot pain has resolved and she walks without pain now for 45 min.  She also noticed her hernia pain is better and she is practicing the right way to get in/out of bed.   Today, pt required more manual Tx externally through clothing to decrease R pelvic floor tightness. Further one -sided exercises were provided to length and strength R trunk due to the weakness from ileostomy and cholestomy bag located on R low quadrant of abdomen.Anticipate pt will be ready for d/c at next session.  Pt continues to benefit from skilled PT to ensure pt is able to maintain her HEP which pt required cues today to ensure correct form.    Personal Factors and Comorbidities  Comorbidity 3+    Examination-Activity Limitations  Sit;Other;Self Feeding    Stability/Clinical Decision Making  Evolving/Moderate complexity    Rehab Potential  Good    PT Frequency  [place]    PT Duration  --   10   PT Treatment/Interventions  ADLs/Self Care Home Management;Moist Heat;Gait training;Neuromuscular re-education;Manual techniques;Therapeutic activities;Therapeutic exercise;Patient/family education;Balance training;Scar mobilization;Energy conservation;Manual lymph drainage;Taping    Consulted and Agree with Plan of Care  Patient       Patient will benefit from skilled therapeutic intervention in order to improve the following deficits and impairments:  Abnormal gait, Improper body mechanics, Pain, Postural dysfunction, Increased muscle spasms, Decreased scar mobility, Decreased mobility, Decreased coordination, Decreased range of motion, Decreased endurance,  Decreased strength, Difficulty walking  Visit Diagnosis: Abnormal posture  Other lack of coordination  Other abnormalities of gait and mobility  Pain in right foot  Muscle weakness (generalized)     Problem List Patient Active Problem List   Diagnosis Date Noted  . Essential hypertension 07/24/2017  . Hyperlipidemia, mixed 07/24/2017  . Crohn disease ([place]) 06/15/2017  . GERD (gastroesophageal reflux disease) 06/15/2017  . Melena 06/15/2017  . Palpitations 04/20/2017  . History of duodenal ulcer 03/03/2017  . Pancreatic insufficiency 03/03/2017  . DDD (degenerative disc disease), cervical 02/18/2017  . Spondylosis of cervical region without myelopathy or radiculopathy 02/18/2017  . Chronic pain syndrome 02/18/2017  . Cervical myofascial pain syndrome 01/21/2017  . Irritable bowel syndrome with both constipation and diarrhea 11/12/2016  . Osteoarthritis 10/09/2016  . Trigger middle finger of left hand 10/09/2016  . Controlled type 2 diabetes mellitus with diabetic nephropathy, with long-term current use of insulin ([place]) 07/09/2016  . Iron deficiency anemia 02/18/2016  . Atopic dermatitis 11/19/2015  . Chronic urticaria 11/19/2015  . Dysphagia, unspecified 11/19/2015  . Vitamin D deficiency, unspecified 07/25/2015  . Fibromyalgia 05/03/2015  . H/O osteopenia 05/03/2015  . Hyperlipidemia 08/10/2014  . Piriformis syndrome, right 07/27/2014  . Shin splint, right, initial encounter 07/27/2014  . SI (sacroiliac) joint dysfunction 07/27/2014  . Intractable migraine without status migrainosus 06/28/2014  . 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[place]) 06/28/2014  . Uncontrolled type 2 diabetes mellitus ([place]) 06/28/2014  . Long-term insulin use ([place]) 04/17/2014  . Peripheral polyneuropathy 04/17/2014  . Mild persistent asthma 03/29/2014  . Advance directive on file 07/28/2013  . Refusal of blood transfusions as patient is Jehovah's Witness 07/28/2013  . Bladder pain 01/19/2013  . Incomplete  emptying of bladder 01/19/2013  . Pelvic floor dysfunction 01/19/2013  . Anxiety and depression 09/24/2012  . COPD (chronic obstructive pulmonary disease) ([place]) 09/24/2012  .  Diarrhea 08/17/2012  . 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  . Chronic cystitis 05/03/2012  . Other specified symptom associated with female genital organs 05/03/2012    Jerl Mina ,PT, DPT, E-RYT  03/01/2019, 12:38 PM  [place] MAIN [facility] SERVICES [address] [place], [place], [address] Phone: [phone]   Fax:  [phone]  Name: Caroline Hughes MRN: [id number] Date of Birth: 1960/05/23

## 2019-03-07 ENCOUNTER — Encounter: Payer: Medicare Other | Admitting: Physical Therapy

## 2019-03-09 ENCOUNTER — Encounter: Payer: Medicare Other | Admitting: Physical Therapy

## 2019-03-14 ENCOUNTER — Ambulatory Visit: Payer: Medicare Other | Admitting: Physical Therapy

## 2019-03-21 ENCOUNTER — Encounter: Payer: Medicare Other | Admitting: Physical Therapy

## 2019-03-23 ENCOUNTER — Encounter: Payer: Medicare Other | Admitting: Physical Therapy

## 2019-03-30 ENCOUNTER — Other Ambulatory Visit: Payer: Self-pay | Admitting: Student

## 2019-03-30 DIAGNOSIS — R059 Cough, unspecified: Secondary | ICD-10-CM

## 2019-03-30 DIAGNOSIS — R1312 Dysphagia, oropharyngeal phase: Secondary | ICD-10-CM

## 2019-03-30 DIAGNOSIS — R05 Cough: Secondary | ICD-10-CM

## 2019-04-01 ENCOUNTER — Other Ambulatory Visit: Payer: Self-pay | Admitting: Student

## 2019-04-01 DIAGNOSIS — R070 Pain in throat: Secondary | ICD-10-CM

## 2019-04-01 DIAGNOSIS — R1310 Dysphagia, unspecified: Secondary | ICD-10-CM

## 2019-04-01 DIAGNOSIS — R633 Feeding difficulties, unspecified: Secondary | ICD-10-CM

## 2019-04-01 DIAGNOSIS — R1312 Dysphagia, oropharyngeal phase: Secondary | ICD-10-CM

## 2019-04-05 NOTE — Therapy (Addendum)
[place] MAIN [facility] SERVICES [address] [place], [place], [address] Phone: [phone]   Fax:  [phone]  Patient Details  Name: Caroline Hughes MRN: [id number] Date of Birth: [birth year] Referring Provider:  Lavera Guise, [place]*  Encounter Date: 02/28/2019   Discharge Note  ( 01/27/19- 02/28/19 across 4 visits )      Clinical Impression Statement Pt has achieved 100% of all her 4 goals.  Pt's Pain Disability Index decreased from 50% to < 17% which indicates significantly improved pain and QOL.   Pt reports rectal spasms have improved by 80% and able to sit longer to 45 min compared to 15 min, tilts her pelvis.  R foot pain has resolved and she walks without pain now for 45 min.  She also noticed her hernia pain is better and she is practicing the right way to get in/out of bed.   Through manual Tx, neuromuscular re-ed, therapeutic exercise, pt's has demo'd equal alignment of pelvic girdle, shoulder height, increased hip abduction strength, no more forward head/thoracic kyphosis, increased deep core coordination/ strength,  and improved gait pattern.   At her last session #4, pt required more manual Tx externally through clothing to decrease R pelvic floor tightness. Further one -sided exercises were provided to length and strength R trunk due to the weakness from ileostomy and cholestomy bag located on R low quadrant of abdomen.    Pt communicated with DPT pt no longer needs her last session. Pt is ready for d/c at this time.  Thank you for referral!      PT Long Term Goals - 02/28/19 1116      PT LONG TERM GOAL #1   Title  Pt will decrease her [place] score from 50% to < 25% in order to improve QOL  ( 11/9: 17%)    Time  10    Period  Weeks    Status  Achieved      PT LONG TERM GOAL #2   Title  Pt will demo  proper deep core coordination without chest breathing and optimal excursion of diaphragm/pelvic floor in order to promote  intraabdominal pressure for digestion/ swallowing    Period  Weeks    Status  Achieved      PT LONG TERM GOAL #3   Title  Pt will demo proper alignment and technique with sitting, logrolling, sit to stand, lifting, body mechanics with ADLs to minimize straining of abdominopelvic floor mm and spine, lowering risk for worsening hernias.    Time  8    Period  Weeks    Status  Achieved      PT LONG TERM GOAL #4   Title  Pt will demo IND with resistance band exercises and deep core strengthening exercises to promote improve abdominal / deep core strength    Time  5    Period  Weeks    Status  Achieved         Jerl Mina ,PT, DPT, E-RYT  04/05/2019, 1:08 PM  [place] MAIN [facility] SERVICES [address] [place], [place], [address] Phone: [phone]   Fax:  [phone]

## 2019-04-11 ENCOUNTER — Encounter: Payer: Medicare Other | Admitting: Physical Therapy

## 2019-04-19 ENCOUNTER — Ambulatory Visit: Payer: Medicare Other

## 2019-05-06 ENCOUNTER — Ambulatory Visit: Payer: Medicare Other

## 2019-05-25 DIAGNOSIS — G8929 Other chronic pain: Secondary | ICD-10-CM | POA: Insufficient documentation

## 2019-09-24 ENCOUNTER — Emergency Department
Admission: EM | Admit: 2019-09-24 | Discharge: 2019-09-24 | Disposition: A | Discharge status: Home / Self Care | Payer: Medicare Other | Attending: Emergency Medicine | Admitting: Emergency Medicine

## 2019-09-24 ENCOUNTER — Other Ambulatory Visit: Payer: Self-pay

## 2019-09-24 DIAGNOSIS — Z5321 Procedure and treatment not carried out due to patient leaving prior to being seen by health care provider: Secondary | ICD-10-CM | POA: Diagnosis not present

## 2019-09-24 DIAGNOSIS — N159 Renal tubulo-interstitial disease, unspecified: Secondary | ICD-10-CM | POA: Insufficient documentation

## 2019-09-24 LAB — URINALYSIS, ROUTINE W REFLEX MICROSCOPIC
Bacteria, UA: NONE SEEN
Bilirubin Urine: NEGATIVE
Glucose, UA: >=500 mg/dL — AB
Ketones, ur: NEGATIVE mg/dL
Nitrite: NEGATIVE
Protein, ur: NEGATIVE mg/dL
Specific Gravity, Urine: 1.011 (ref 1.005–1.030)
WBC, UA: >50 WBC/hpf — ABNORMAL HIGH (ref 0–5)
pH: 5 (ref 5.0–8.0)

## 2019-09-24 LAB — BASIC METABOLIC PANEL
Anion gap: 12 (ref 5–15)
BUN: 16 mg/dL (ref 6–20)
CO2: 27 mmol/L (ref 22–32)
Calcium: 9.2 mg/dL (ref 8.9–10.3)
Chloride: 97 mmol/L — ABNORMAL LOW (ref 98–111)
Creatinine, Ser: 0.93 mg/dL (ref 0.44–1.00)
GFR calc Af Amer: >60 mL/min (ref >60)
GFR calc non Af Amer: >60 mL/min (ref >60)
Glucose, Bld: 279 mg/dL — ABNORMAL HIGH (ref 70–99)
Potassium: 4.3 mmol/L (ref 3.5–5.1)
Sodium: 136 mmol/L (ref 135–145)

## 2019-09-24 LAB — CBC WITH DIFFERENTIAL/PLATELET
Abs Immature Granulocytes: 0.03 10*3/uL (ref 0.00–0.07)
Basophils Absolute: 0 10*3/uL (ref 0.0–0.1)
Basophils Relative: 1 %
Eosinophils Absolute: 0.3 10*3/uL (ref 0.0–0.5)
Eosinophils Relative: 5 %
HCT: 39.5 % (ref 36.0–46.0)
Hemoglobin: 12.9 g/dL (ref 12.0–15.0)
Immature Granulocytes: 1 %
Lymphocytes Relative: 16 %
Lymphs Abs: 1.1 10*3/uL (ref 0.7–4.0)
MCH: 31.1 pg (ref 26.0–34.0)
MCHC: 32.7 g/dL (ref 30.0–36.0)
MCV: 95.2 fL (ref 80.0–100.0)
Monocytes Absolute: 0.6 10*3/uL (ref 0.1–1.0)
Monocytes Relative: 9 %
Neutro Abs: 4.5 10*3/uL (ref 1.7–7.7)
Neutrophils Relative %: 68 %
Platelets: 470 10*3/uL — ABNORMAL HIGH (ref 150–400)
RBC: 4.15 MIL/uL (ref 3.87–5.11)
RDW: 12.9 % (ref 11.5–15.5)
WBC: 6.5 10*3/uL (ref 4.0–10.5)
nRBC: 0 % (ref 0.0–0.2)

## 2019-09-24 NOTE — ED Triage Notes (Signed)
Patient states sent by her primary care doctor for IV antibiotics for a kidney infection.

## 2019-09-24 NOTE — ED Notes (Signed)
No answer x 2, this RN notified by Meryl Dare, EDT that patient not visualized in ED when repeat VS were being obtained.

## 2019-09-26 LAB — URINE CULTURE: Culture: 50000 — AB

## 2019-09-27 NOTE — Progress Notes (Signed)
ED Antimicrobial Stewardship Positive Culture Follow Up   Caroline Hughes is an 59 y.o. female who presented to [facility] on 09/24/2019 with a chief complaint of  Chief Complaint  Patient presents with  . Kidney Infection    Recent Results (from the past 720 hour(s))  Urine culture     Status: Abnormal   Collection Time: 09/24/19  6:59 AM   Specimen: Urine, Random  Result Value Ref Range Status   Specimen Description   Final    URINE, RANDOM Performed at [facility], [address], [place], [place] [address]    Special Requests   Final    NONE Performed at [facility], [place], [place] [address]    Culture 50,000 COLONIES/mL STAPHYLOCOCCUS AUREUS (A)  Final   Report Status 09/26/2019 FINAL  Final   Organism ID, Bacteria STAPHYLOCOCCUS AUREUS (A)  Final      Susceptibility   Staphylococcus aureus - MIC*    CIPROFLOXACIN <=0.5 SENSITIVE Sensitive     GENTAMICIN <=0.5 SENSITIVE Sensitive     NITROFURANTOIN <=16 SENSITIVE Sensitive     OXACILLIN <=0.25 SENSITIVE Sensitive     TETRACYCLINE <=1 SENSITIVE Sensitive     VANCOMYCIN 1 SENSITIVE Sensitive     TRIMETH/SULFA <=10 SENSITIVE Sensitive     CLINDAMYCIN <=0.25 SENSITIVE Sensitive     RIFAMPIN <=0.5 SENSITIVE Sensitive     Inducible Clindamycin NEGATIVE Sensitive     * 50,000 COLONIES/mL STAPHYLOCOCCUS AUREUS   Patient left on 09/23/19 without treatment.  This pharmacist followed up with ED Provider Merlyn Lot) and was instructed to call patient and ask her to return.  When I spoke to patient on the phone and asked her to return to the ED for IV antibiotics, she stated "that she went to [facility] and had it done today."  Lu Duffel 09/27/2019, 2:33 PM Clinical Pharmacist

## 2019-10-25 ENCOUNTER — Other Ambulatory Visit: Payer: Self-pay | Admitting: Neurology

## 2019-10-25 DIAGNOSIS — R2689 Other abnormalities of gait and mobility: Secondary | ICD-10-CM

## 2019-11-02 ENCOUNTER — Other Ambulatory Visit: Payer: Self-pay

## 2019-11-02 ENCOUNTER — Ambulatory Visit (INDEPENDENT_AMBULATORY_CARE_PROVIDER_SITE_OTHER): Payer: Medicare Other | Admitting: Urology

## 2019-11-02 ENCOUNTER — Encounter: Payer: Self-pay | Admitting: Urology

## 2019-11-02 VITALS — BP 165/91 | HR 122 | Ht 62.0 in | Wt 138.6 lb

## 2019-11-02 DIAGNOSIS — Z87448 Personal history of other diseases of urinary system: Secondary | ICD-10-CM | POA: Diagnosis not present

## 2019-11-02 DIAGNOSIS — N390 Urinary tract infection, site not specified: Secondary | ICD-10-CM | POA: Diagnosis not present

## 2019-11-02 NOTE — Progress Notes (Signed)
11/02/2019 9:12 AM   Caroline Hughes [birth year] [id number]  Referring provider: Sharyne Peach, MD [place],  [place] [address] Chief Complaint  Patient presents with  . Urinary Retention    HPI: Caroline Hughes is a 59 y.o. female who presents today for history UTI   -Seen at urgent care on  09/07/19 complaining of dysuria, left flank pain, low grade fever -Treated for pyelonephritis with Levaquin -Urine culture grew staph aureus -Had mild symptom improvement but it worsened and she was seen at the ED at [place] IV antibiotics and discharged on clindamycin -Repeat urine culture at that visit grew staph aureus -Had resolution of symptoms though has intermittent mild dysuria -No flank, abdominal or pelvic pain -Occasional UTIs in the past but not recurrent  -No previous history of other urologic problems  -Urinalysis today is cloudy with 2+ leukocytes, greater than 30 WBCs, 3-20 RBCs   PMH: Past Medical History:  Diagnosis Date  . Anemia    Iron deficiency IRON INFUSIONS X 5 WEEKS  . Anxiety   . Arthritis   . Asthma   . Bronchitis 2017  . Complication of anesthesia   . Crohn's disease ([place])   . Depression   . Diabetes mellitus without complication ([place])   . Endometriosis   . Fibromyalgia   . Gastric ulcer   . GERD (gastroesophageal reflux disease)   . Herniated disc, cervical   . Hyperlipemia   . Intestinal obstruction ([place])   . Migraine    CHRONIC  . Nausea    CHRONIC  . Neuropathy   . Pain    BACK  . Peristomal hernia   . PONV (postoperative nausea and vomiting)   . Ulcerative colitis [facility])     Surgical History: Past Surgical History:  Procedure Laterality Date  . ABDOMINAL HYSTERECTOMY    . CATARACT EXTRACTION W/PHACO Right 11/25/2016   Procedure: CATARACT EXTRACTION PHACO AND INTRAOCULAR LENS PLACEMENT (IOC);  Surgeon: Birder Robson, MD;  Location: [facility] ORS;  Service: Ophthalmology;  Laterality: Right;  [place]  00:40 AP% 17.4 CDE 6.94 Fluid pack lot # [id number]  . CATARACT EXTRACTION W/PHACO Left 12/02/2016   Procedure: CATARACT EXTRACTION PHACO AND INTRAOCULAR LENS PLACEMENT ([place]);  Surgeon: Birder Robson, MD;  Location: [facility] ORS;  Service: Ophthalmology;  Laterality: Left;  [place] 00:34 AP% 14.2 CDE 4.94 Flyuid pack lot 3 [id number]  . CHOLECYSTECTOMY    . COLON RESECTION    . ESOPHAGOGASTRODUODENOSCOPY (EGD) WITH PROPOFOL N/A 06/17/2017   Procedure: ESOPHAGOGASTRODUODENOSCOPY (EGD) WITH PROPOFOL;  Surgeon: [name], MD;  Location: [facility] ENDOSCOPY;  Service: Gastroenterology;  Laterality: N/A;  . ESOPHAGOGASTRODUODENOSCOPY (EGD) WITH PROPOFOL N/A 06/19/2017   Procedure: ESOPHAGOGASTRODUODENOSCOPY (EGD) WITH PROPOFOL;  Surgeon: [name], MD;  Location[facility] ENDOSCOPY;  Service: Gastroenterology;  Laterality: N/A;  . ESOPHAGOGASTRODUODENOSCOPY (EGD) WITH PROPOFOL N/A 01/26/2019   Procedure: ESOPHAGOGASTRODUODENOSCOPY (EGD) WITH PROPOFOL;  Surgeon: [place], Benay Pike, MD;  Location: [facility] ENDOSCOPY;  Service: Gastroenterology;  Laterality: N/A;  . HERNIA REPAIR     MULTIPLE PERISTOMAL  . ILEOSCOPY N/A 06/17/2017   Procedure: ILEOSCOPY THROUGH STOMA;  Surgeon: [name], MD;  Location: [facility] ENDOSCOPY;  Service: Gastroenterology;  Laterality: N/A;  . ILEOSCOPY N/A 06/23/2017   Procedure: ILEOSCOPY THROUGH STOMA;  Surgeon: Lin Landsman, MD;  Location: [facility] ENDOSCOPY;  Service: Gastroenterology;  Laterality: N/A;  . ILEOSTOMY Bilateral    MOVED X 3  . rectal fistula packing     X 3  Home Medications:  Allergies as of 11/02/2019      Reactions   Naproxen Other (See Comments)   Other Reaction: GI bleed   Penicillins Swelling, Rash, Other (See Comments)   Reaction: fever Has patient had a PCN reaction causing immediate rash, facial/tongue/throat swelling, SOB or lightheadedness with hypotension: Yes Has patient had a PCN reaction causing severe rash involving mucus membranes or skin necrosis:  No Has patient had a PCN reaction that required hospitalization: No Has patient had a PCN reaction occurring within the last 10 years: No If all of the above answers are "NO", then may proceed with Cephalosporin use.   Gluten Meal    Bloating, gas, pain   Lactose Intolerance (gi)    Bloating, gas, pain   Latex Itching   redness   Soy Allergy Other (See Comments)   Reaction: digestive issues.   Sulfonylureas Other (See Comments)   Reaction: Pt isn't sure what medication this one was associated with or what the reaction was.   Doxycycline Rash   Humira [adalimumab] Rash, Other (See Comments)   Reaction: fever.   Other Diarrhea, Nausea And Vomiting, Other (See Comments)   "DIGESTIVE ISSUES" Nuts "DIGESTIVE PROBLEMS"   Simvastatin Rash   Statins Rash   Sulfa Antibiotics Rash   Sulfasalazine Rash   Vancomycin Rash, Other (See Comments)   Reaction: fever      Medication List       Accurate as of November 02, 2019  9:12 AM. If you have any questions, ask your nurse or doctor.        Advair HFA 230-21 MCG/ACT inhaler Generic drug: fluticasone-salmeterol Inhale 2 puffs into the lungs 2 (two) times daily.   Ajovy 225 MG/1.5ML Sosy Generic drug: Fremanezumab-vfrm Inject into the skin.   albuterol 108 (90 Base) MCG/ACT inhaler Commonly known as: VENTOLIN HFA Inhale 2 puffs into the lungs every 6 (six) hours as needed for wheezing or shortness of breath.   albuterol (2.5 MG/3ML) 0.083% nebulizer solution Commonly known as: PROVENTIL Inhale 3 mLs into the lungs every 6 (six) hours as needed for shortness of breath.   ALPRAZolam 1 MG tablet Commonly known as: XANAX Take by mouth.   baclofen 10 MG tablet Commonly known as: LIORESAL Take 10 mg by mouth 2 (two) times daily.   Cimzia 2 X 200 MG Kit Generic drug: Certolizumab Pegol Inject 400 mg into the skin every 30 (thirty) days. Once a month on the 13th   Cimzia Prefilled 2 X 200 MG/ML Kit Generic drug: Certolizumab  Pegol   clindamycin 300 MG capsule Commonly known as: CLEOCIN Take 300 mg by mouth every 6 (six) hours.   cyanocobalamin 1000 MCG/ML injection Commonly known as: (VITAMIN B-12) Inject 1,000 mcg into the muscle every 30 (thirty) days.   desipramine 25 MG tablet Commonly known as: NORPRAMIN   dicyclomine 10 MG capsule Commonly known as: BENTYL Take 10 mg by mouth every morning.   famotidine 20 MG tablet Commonly known as: PEPCID Take 20 mg by mouth 2 (two) times daily.   gabapentin 800 MG tablet Commonly known as: NEURONTIN Take 800 mg by mouth 3 (three) times daily as needed (pain).   hydrOXYzine 25 MG tablet Commonly known as: ATARAX/VISTARIL Take 25 mg by mouth every 8 (eight) hours as needed for anxiety.   insulin detemir 100 UNIT/ML injection Commonly known as: LEVEMIR Inject 0.14 mLs (14 Units total) into the skin at bedtime.   levofloxacin 500 MG tablet Commonly known as: LEVAQUIN Take  500 mg by mouth daily.   LORazepam 0.5 MG tablet Commonly known as: ATIVAN Take by mouth.   metoCLOPramide 10 MG tablet Commonly known as: REGLAN Take by mouth.   Milnacipran 50 MG Tabs tablet Commonly known as: SAVELLA Take 50 mg by mouth 2 (two) times daily.   montelukast 10 MG tablet Commonly known as: SINGULAIR Take 10 mg by mouth at bedtime.   MSM 1000 MG Caps   nortriptyline 10 MG capsule Commonly known as: PAMELOR Take 10 mg by mouth 2 (two) times daily.   Nyamyc powder Generic drug: nystatin   ondansetron 8 MG disintegrating tablet Commonly known as: ZOFRAN-ODT Take 8 mg by mouth 2 (two) times daily as needed for nausea/vomiting.   ondansetron 8 MG tablet Commonly known as: ZOFRAN Take by mouth.   Pancrelipase (Lip-Prot-Amyl) 25000 units Cpep Take 1 capsule by mouth daily.   pantoprazole 40 MG tablet Commonly known as: PROTONIX Take 1 tablet (40 mg total) by mouth 2 (two) times daily before a meal.   predniSONE 10 MG tablet Commonly known as:  DELTASONE   ranitidine 150 MG capsule Commonly known as: ZANTAC Take 150 mg by mouth daily as needed for heartburn.   sucralfate 1 GM/10ML suspension Commonly known as: CARAFATE Take 1 g by mouth 4 (four) times daily -  with meals and at bedtime.   SUMAtriptan 100 MG tablet Commonly known as: IMITREX Take 100 mg by mouth as directed.   tiZANidine 4 MG tablet Commonly known as: ZANAFLEX Take by mouth.   traMADol 50 MG tablet Commonly known as: ULTRAM Take 50 mg by mouth every 6 (six) hours as needed.   traZODone 100 MG tablet Commonly known as: DESYREL Take 200 mg by mouth at bedtime.   UltiCare Micro Pen Needles 32G X 4 MM Misc Generic drug: Insulin Pen Needle   Victoza 18 MG/3ML Sopn Generic drug: liraglutide Inject into the skin.   Vitamin D (Ergocalciferol) 1.25 MG (50000 UNIT) Caps capsule Commonly known as: DRISDOL Take by mouth.   Vitamin D-1000 Max St 25 MCG (1000 UT) tablet Generic drug: Cholecalciferol Take by mouth.       Allergies:  Allergies  Allergen Reactions  . Naproxen Other (See Comments)    Other Reaction: GI bleed  . Penicillins Swelling, Rash and Other (See Comments)    Reaction: fever Has patient had a PCN reaction causing immediate rash, facial/tongue/throat swelling, SOB or lightheadedness with hypotension: Yes Has patient had a PCN reaction causing severe rash involving mucus membranes or skin necrosis: No Has patient had a PCN reaction that required hospitalization: No Has patient had a PCN reaction occurring within the last 10 years: No If all of the above answers are "NO", then may proceed with Cephalosporin use.   . Gluten Meal     Bloating, gas, pain  . Lactose Intolerance (Gi)     Bloating, gas, pain  . Latex Itching    redness  . Soy Allergy Other (See Comments)    Reaction: digestive issues.  . Sulfonylureas Other (See Comments)    Reaction: Pt isn't sure what medication this one was associated with or what the reaction  was.  . Doxycycline Rash  . Humira [Adalimumab] Rash and Other (See Comments)    Reaction: fever.  . Other Diarrhea, Nausea And Vomiting and Other (See Comments)    "DIGESTIVE ISSUES" Nuts "DIGESTIVE PROBLEMS"  . Simvastatin Rash  . Statins Rash  . Sulfa Antibiotics Rash  . Sulfasalazine Rash  .  Vancomycin Rash and Other (See Comments)    Reaction: fever    Family History: Family History  Problem Relation Age of Onset  . Hypertension Father   . Hyperlipidemia Father   . Heart attack Father     Social History:  reports that she has quit smoking. She has never used smokeless tobacco. She reports current alcohol use of about 3.0 standard drinks of alcohol per week. She reports that she does not use drugs.   Physical Exam: BP (!) 165/91 (BP Location: Left Arm, Patient Position: Sitting, Cuff Size: Normal)   Pulse (!) 122   Ht 5' 2"  (1.575 m)   Wt 138 lb 9.6 oz (62.9 kg)   BMI 25.35 kg/m   Constitutional:  Alert and oriented, No acute distress. HEENT: [place] AT, moist mucus membranes.  Trachea midline, no masses. Cardiovascular: No clubbing, cyanosis, or edema. Respiratory: Normal respiratory effort, no increased work of breathing. GI: Abdomen is soft, nontender, nondistended, no abdominal masses GU: No CVA tenderness Lymph: No cervical or inguinal lymphadenopathy. Skin: No rashes, bruises or suspicious lesions. Neurologic: Grossly intact, no focal deficits, moving all 4 extremities. Psychiatric: Normal mood and affect.  Laboratory Data:  Lab Results  Component Value Date   CREATININE 0.93 09/24/2019    Assessment & Plan:    1. Recent UTI with Persistent Pyuria -Urine culture ordered today -Schedule renal ultrasound -Further recommendations pending above results  -PVR by bladder scan today was 0 mL  [facility] [address] [place], [place] [address] [phone]  I, Joneen Boers Peace, am acting as a Education administrator for Dr. Nicki Reaper C.  Keishaun Hazel.  I have reviewed the above documentation for accuracy and completeness, and I agree with the above.   Abbie Sons, MD

## 2019-11-03 LAB — URINALYSIS, COMPLETE
Bilirubin, UA: NEGATIVE
Ketones, UA: NEGATIVE
Nitrite, UA: NEGATIVE
Protein,UA: NEGATIVE
Specific Gravity, UA: 1.01 (ref 1.005–1.030)
Urobilinogen, Ur: 0.2 mg/dL (ref 0.2–1.0)
pH, UA: 5 (ref 5.0–7.5)

## 2019-11-03 LAB — MICROSCOPIC EXAMINATION: WBC, UA: >30 /hpf — AB (ref 0–5)

## 2019-11-08 ENCOUNTER — Other Ambulatory Visit: Payer: Self-pay | Admitting: Urology

## 2019-11-08 ENCOUNTER — Telehealth: Payer: Self-pay | Admitting: *Deleted

## 2019-11-08 LAB — CULTURE, URINE COMPREHENSIVE

## 2019-11-08 MED ORDER — NITROFURANTOIN MONOHYD MACRO 100 MG PO CAPS
100.0000 mg | ORAL_CAPSULE | Freq: Two times a day (BID) | ORAL | 0 refills | Status: AC
Start: 2019-11-08 — End: 2019-11-18

## 2019-11-08 NOTE — Telephone Encounter (Signed)
Notified patient as instructed, patient pleased. Discussed follow-up appointments, patient agrees  

## 2019-11-08 NOTE — Telephone Encounter (Signed)
-----   Message from Abbie Sons, MD sent at 11/08/2019  1:55 PM EDT ----- Urine culture still growing staph.  Antibiotic Rx was sent to pharmacy.  Renal ultrasound scheduled this week

## 2019-11-09 ENCOUNTER — Other Ambulatory Visit: Payer: Self-pay

## 2019-11-09 ENCOUNTER — Ambulatory Visit
Admission: RE | Admit: 2019-11-09 | Discharge: 2019-11-09 | Disposition: A | Discharge status: Home / Self Care | Payer: Medicare Other | Source: Ambulatory Visit | Attending: Neurology | Admitting: Neurology

## 2019-11-09 DIAGNOSIS — R2689 Other abnormalities of gait and mobility: Secondary | ICD-10-CM | POA: Diagnosis present

## 2019-11-09 MED ORDER — GADOBUTROL 1 MMOL/ML IV SOLN
6.0000 mL | Freq: Once | INTRAVENOUS | Status: AC | PRN
  Administered 2019-11-09: 6 mL via INTRAVENOUS

## 2019-11-11 ENCOUNTER — Other Ambulatory Visit: Payer: Self-pay

## 2019-11-11 ENCOUNTER — Ambulatory Visit
Admission: RE | Admit: 2019-11-11 | Discharge: 2019-11-11 | Disposition: A | Discharge status: Home / Self Care | Payer: Medicare Other | Source: Ambulatory Visit | Attending: Urology | Admitting: Urology

## 2019-11-11 DIAGNOSIS — N390 Urinary tract infection, site not specified: Secondary | ICD-10-CM | POA: Diagnosis not present

## 2019-11-14 ENCOUNTER — Telehealth: Payer: Self-pay | Admitting: *Deleted

## 2019-11-14 NOTE — Telephone Encounter (Signed)
-----   Message from Abbie Sons, MD sent at 11/14/2019  8:06 AM EDT ----- RUS showed no abnormalities.  Recommend repeat urinalysis possible culture 2-3 weeks (lab visit)

## 2019-11-14 NOTE — Telephone Encounter (Signed)
Notified patient as instructed, patient pleased. Discussed follow-up appointments, patient agrees  

## 2019-11-30 ENCOUNTER — Other Ambulatory Visit: Payer: Self-pay | Admitting: Family Medicine

## 2019-11-30 DIAGNOSIS — N390 Urinary tract infection, site not specified: Secondary | ICD-10-CM

## 2019-12-01 ENCOUNTER — Other Ambulatory Visit: Payer: Medicare Other

## 2019-12-01 ENCOUNTER — Other Ambulatory Visit: Payer: Self-pay

## 2019-12-01 DIAGNOSIS — N390 Urinary tract infection, site not specified: Secondary | ICD-10-CM

## 2019-12-02 LAB — URINALYSIS, COMPLETE
Bilirubin, UA: NEGATIVE
Glucose, UA: NEGATIVE
Ketones, UA: NEGATIVE
Leukocytes,UA: NEGATIVE
Nitrite, UA: NEGATIVE
Protein,UA: NEGATIVE
RBC, UA: NEGATIVE
Specific Gravity, UA: 1.015 (ref 1.005–1.030)
Urobilinogen, Ur: 0.2 mg/dL (ref 0.2–1.0)
pH, UA: 5 (ref 5.0–7.5)

## 2019-12-02 LAB — MICROSCOPIC EXAMINATION
Bacteria, UA: NONE SEEN
RBC, Urine: NONE SEEN /hpf (ref 0–2)

## 2019-12-05 LAB — CULTURE, URINE COMPREHENSIVE

## 2020-07-06 ENCOUNTER — Other Ambulatory Visit: Payer: Self-pay | Admitting: Gastroenterology

## 2020-07-06 DIAGNOSIS — K50118 Crohn's disease of large intestine with other complication: Secondary | ICD-10-CM

## 2020-07-10 ENCOUNTER — Other Ambulatory Visit: Payer: Self-pay | Admitting: Student

## 2020-07-10 DIAGNOSIS — G8929 Other chronic pain: Secondary | ICD-10-CM

## 2020-07-10 DIAGNOSIS — M7582 Other shoulder lesions, left shoulder: Secondary | ICD-10-CM

## 2020-07-10 DIAGNOSIS — M7581 Other shoulder lesions, right shoulder: Secondary | ICD-10-CM

## 2020-07-23 ENCOUNTER — Other Ambulatory Visit: Payer: Self-pay

## 2020-07-23 ENCOUNTER — Ambulatory Visit
Admission: RE | Admit: 2020-07-23 | Discharge: 2020-07-23 | Disposition: A | Discharge status: Home / Self Care | Payer: Medicare Other | Source: Ambulatory Visit | Attending: Gastroenterology | Admitting: Gastroenterology

## 2020-07-23 DIAGNOSIS — K50118 Crohn's disease of large intestine with other complication: Secondary | ICD-10-CM | POA: Diagnosis not present

## 2020-07-23 LAB — POCT I-STAT CREATININE: Creatinine, Ser: 0.8 mg/dL (ref 0.44–1.00)

## 2020-07-23 MED ORDER — IOHEXOL 300 MG/ML  SOLN
100.0000 mL | Freq: Once | INTRAMUSCULAR | Status: AC | PRN
  Administered 2020-07-23: 100 mL via INTRAVENOUS

## 2020-07-25 ENCOUNTER — Ambulatory Visit
Admission: RE | Admit: 2020-07-25 | Discharge: 2020-07-25 | Disposition: A | Discharge status: Home / Self Care | Payer: Medicare Other | Source: Ambulatory Visit | Attending: Student | Admitting: Student

## 2020-07-25 ENCOUNTER — Other Ambulatory Visit: Payer: Self-pay

## 2020-07-25 DIAGNOSIS — M7582 Other shoulder lesions, left shoulder: Secondary | ICD-10-CM | POA: Diagnosis present

## 2020-07-25 DIAGNOSIS — G8929 Other chronic pain: Secondary | ICD-10-CM

## 2020-07-25 DIAGNOSIS — M7581 Other shoulder lesions, right shoulder: Secondary | ICD-10-CM | POA: Insufficient documentation

## 2020-07-25 DIAGNOSIS — M25512 Pain in left shoulder: Secondary | ICD-10-CM | POA: Insufficient documentation

## 2020-07-25 DIAGNOSIS — M25511 Pain in right shoulder: Secondary | ICD-10-CM | POA: Diagnosis not present

## 2021-05-21 DIAGNOSIS — M4310 Spondylolisthesis, site unspecified: Secondary | ICD-10-CM | POA: Insufficient documentation

## 2021-05-21 DIAGNOSIS — M899 Disorder of bone, unspecified: Secondary | ICD-10-CM | POA: Insufficient documentation

## 2021-05-21 DIAGNOSIS — M4802 Spinal stenosis, cervical region: Secondary | ICD-10-CM | POA: Insufficient documentation

## 2021-05-21 DIAGNOSIS — Z789 Other specified health status: Secondary | ICD-10-CM | POA: Insufficient documentation

## 2021-05-21 DIAGNOSIS — Z79899 Other long term (current) drug therapy: Secondary | ICD-10-CM | POA: Insufficient documentation

## 2021-05-21 DIAGNOSIS — M47812 Spondylosis without myelopathy or radiculopathy, cervical region: Secondary | ICD-10-CM | POA: Insufficient documentation

## 2021-05-21 DIAGNOSIS — R936 Abnormal findings on diagnostic imaging of limbs: Secondary | ICD-10-CM | POA: Insufficient documentation

## 2021-05-21 NOTE — Progress Notes (Signed)
Patient: Caroline Hughes  Service Category: E/M  Provider: Gaspar Cola, MD  DOB: 01-04-1961  DOS: 05/22/2021  Referring Provider: Lattie Corns, [place]*  MRN: [id number]  Setting: Ambulatory outpatient  PCP: Sharyne Peach, MD  Type: New Patient  Specialty: Interventional Pain Management    Location: Office  Delivery: Face-to-face     Primary Reason(s) for Visit: Encounter for initial evaluation of one or more chronic problems (new to examiner) potentially causing chronic pain, and posing a threat to normal musculoskeletal function. (Level of risk: High) CC: Shoulder Pain (bilateral) and Fibromyalgia  HPI  Caroline Hughes is a 61 y.o. year old, female patient, who comes for the first time to our practice referred by Lattie Corns, PA* for our initial evaluation of her chronic pain. She has Iron deficiency anemia; Anxiety and depression; Cervical myofascial pain syndrome; Controlled type 2 diabetes mellitus with diabetic nephropathy, with long-term current use of insulin ([place]); Fibromyalgia; DDD (degenerative disc disease), cervical; Spondylosis of cervical region without myelopathy or radiculopathy; Chronic pain syndrome; Crohn's disease ([place]); GERD (gastroesophageal reflux disease); Melena; Bladder pain; Advance directive on file; [place] of bladder; Atopic dermatitis; Chronic cystitis; Chronic urticaria; COPD (chronic obstructive pulmonary disease) ([place]); Diarrhea; Dysphagia, unspecified; Essential hypertension; H/O osteopenia; History of duodenal ulcer; Hyperlipidemia, mixed; Hyperlipidemia; Incomplete emptying of bladder; Intractable migraine without status migrainosus; Irritable bowel syndrome with both constipation and diarrhea; Long-term insulin use ([place]); Mild persistent asthma; Osteoarthritis; Other specified symptom associated with female genital organs; Palpitations; Pancreatic insufficiency; Pelvic floor dysfunction; Peripheral polyneuropathy; Piriformis syndrome, right; Refusal of  blood transfusions as patient is Jehovah's Witness; 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[place]); Shin splint, right, initial encounter; SI (sacroiliac) joint dysfunction; Trigger middle finger of left hand; Vitamin D deficiency, unspecified; Uncontrolled type 2 diabetes mellitus; Acute non intractable tension-type headache; Arthritis of both hands; Peristomal hernia; Periumbilical abdominal pain; Chronic shoulder pain (1ry area of Pain) (Bilateral) (R>L); Right hand pain; Chronic low back pain (3ry area of Pain) (Bilateral) (L>R) w/o sciatica; Chronic neck pain (5th area of Pain) (Bilateral) (R>L); Pharmacologic therapy; Disorder of skeletal system; Problems influencing health status; 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 Today she comes in for evaluation of her Shoulder Pain (bilateral) and Fibromyalgia  Pain Assessment: Location: Right, Left Shoulder Radiating: arms and neck Onset: More than a month ago Duration: Chronic pain Quality: Burning, Constant (deep) Severity: 6 /10 (subjective, self-reported pain score)  Effect on ADL:   Timing: Constant Modifying factors: heat, ice, topicals, CBD BP: (!) 149/86   HR: (!) 110  Onset and Duration: Present longer than 3 months Cause of pain: Unknown Severity: Getting worse, NAS-11 at its worse: 9/10, and NAS-11 at its best: 3/10 Timing: Not influenced by the time of the day Aggravating  Factors: Motion, Working, and sleeping Alleviating Factors:  no alleviating factors Associated Problems: Inability to concentrate, Tingling, Pain that wakes patient up, and Pain that does not allow patient to sleep Quality of Pain: Agonizing, Burning, Deep, Disabling, Exhausting, Sharp, and Uncomfortable Previous Examinations or Tests: CT  scan, Nerve conduction test, Orthopedic evaluation, and Chiropractic evaluation Previous Treatments: Chiropractic manipulations and Trigger point injections  According to the patient the primary area of pain is that of the shoulders (Bilateral) (R>L).  The patient denies any prior surgeries but does indicate having had physical therapy for the shoulders years ago.  She also indicates having had a recent MRI of the shoulder and has had total of 4 different joint injections that apparently were done by Lattie Corns, PA-C.  She refers that the only injection that helped was the second 1 that was done which provided her with almost 3 months of pain relief.  All of the other injections did not help.  The pain pattern for that shoulder pain is equal bilaterally with also the pain being in the suprascapular, deltoid, and outer upper arm regions with pain on the lateral anterior aspect of the upper arm suggesting biceps tendinopathy versus suprascapular muscle referred pain.  (07/25/2020) bilateral shoulder MRIs revealed: (Right) IMPRESSION: 1. Significant rotator cuff tendinopathy/tendinosis with interstitial tears and shallow bursal surface tears involving the supraspinatus tendon anteriorly. No full-thickness retracted rotator cuff tear. 2. Intact long head biceps tendon and glenoid labrum. 3. Mild/moderate AC joint degenerative changes and type 2-3 acromion may contribute to bony impingement. 4. Mild subacromial/subdeltoid bursitis.   (Left) IMPRESSION: 1. Significant rotator cuff tendinopathy/tendinosis with interstitial tears and areas of bursal and articular  surface fraying and fibrillation. There is a small deep bursal surface tear involving the attachment fibers of the supraspinatus tendon anteriorly. 2. Intact long head biceps tendon and glenoid labrum. 3. Mild to moderate AC joint degenerative changes and mild subacromial spurring may contribute to bony impingement. 4. Mild/moderate subacromial/subdeltoid bursitis.  The patient indicates having had nerve conduction test of the upper and lower extremities.  The lower extremity EMG/PNCV done on 03/18/2021 was read as normal.  In the case of the upper extremity EMG/PNCV done on 03/25/2021 it was read as abnormal with electrodiagnostic evidence of a right mild (grade 2) carpal tunnel syndrome (median nerve entrapment at wrist).  In addition it also showed electrodiagnostic evidence of a right ulnar neuropathy (site undetermined).  The patient's secondary area pain is that of the anal and vaginal region.  The patient indicates having had multiple surgeries due to ulcerative colitis with the last 1 having been around 1982.  She denies any recent x-rays but she does admit to having had physical therapy twice for pelvic floor exercises.  She denies any nerve blocks or interventional pain management treatments of that area.  The patient's third area pain is out of the lower back (Bilateral) (L>R).  The patient denies any prior surgeries, recent x-rays, physical therapy, or any type of nerve blocks or joint injections.  The patient's fourth area pain is that of the abdomen where she refers having chronic abdominal pain secondary to 5 different hernias that she has and significant amount of scar tissue from her prior surgeries.  This pain is described to be in the mid abdominal region bilaterally going on across her anterior abdomen.  She describes having had 9 prior surgeries.  She denies any nerve blocks in that area, physical therapy, or any recent imaging.  Physical exam: Range of motion of the shoulder joints  seems to be somewhat limited due to pain.   Today I took the time to provide the patient with information regarding my pain practice. The patient was informed that my practice is divided into two sections: an interventional pain management  section, as well as a completely separate and distinct medication management section. I explained that I have procedure days for my interventional therapies, and evaluation days for follow-ups and medication management. Because of the amount of documentation required during both, they are kept separated. This means that there is the possibility that she may be scheduled for a procedure on one day, and medication management the next. I have also informed her that because of staffing and facility limitations, I no longer take patients for medication management only. To illustrate the reasons for this, I gave the patient the example of surgeons, and how inappropriate it would be to refer a patient to his/her care, just to write for the post-surgical antibiotics on a surgery done by a different surgeon.   Because interventional pain management is my board-certified specialty, the patient was informed that joining my practice means that they are open to any and all interventional therapies. I made it clear that this does not mean that they will be forced to have any procedures done. What this means is that I believe interventional therapies to be essential part of the diagnosis and proper management of chronic pain conditions. Therefore, patients not interested in these interventional alternatives will be better served under the care of a different practitioner.  The patient was also made aware of my Comprehensive Pain Management Safety Guidelines where by joining my practice, they limit all of their nerve blocks and joint injections to those done by our practice, for as long as we are retained to manage their care.   Historic Controlled Substance Pharmacotherapy Review  PMP  and historical list of controlled substances: Tramadol 50 mg tablet, (# 120/month), 1 tab p.o. 4 times daily (last filled on 01/22/2021) Current opioid analgesics:   None MME/day: 0 mg/day  Historical Monitoring: The patient  reports no history of drug use. List of all UDS Test(s): No results found for: MDMA, COCAINSCRNUR, [place], [place], CANNABQUANT, THCU, [place] List of other Serum/Urine Drug Screening Test(s):  No results found for: AMPHSCRSER, BARBSCRSER, BENZOSCRSER, COCAINSCRSER, COCAINSCRNUR, PCPSCRSER, PCPQUANT, THCSCRSER, THCU, CANNABQUANT, OPIATESCRSER, OXYSCRSER, PROPOXSCRSER, ETH Historical Background Evaluation: [place] PMP: PDMP reviewed during this encounter. Online review of the past 90-monthperiod conducted.             PMP NARX Score Report:  Narcotic: 220 Sedative: 090 Stimulant: 000 [place] Department of public safety, offender search: (Editor, commissioningInformation) Non-contributory Risk Assessment Profile: Aberrant behavior: None observed or detected today Risk factors for fatal opioid overdose: None identified today PMP NARX Overdose Risk Score: 230 Fatal overdose hazard ratio (HR): Calculation deferred Non-fatal overdose hazard ratio (HR): Calculation deferred Risk of opioid abuse or dependence: 0.7-3.0% with doses ? 36 MME/day and 6.1-26% with doses ? 120 MME/day. Substance use disorder (SUD) risk level: See below Personal History of Substance Abuse (SUD-Substance use disorder):  Alcohol:    Illegal Drugs:    Rx Drugs:    ORT Risk Level calculation:    ORT Scoring interpretation table:  Score <3 = Low Risk for SUD  Score between 4-7 = Moderate Risk for SUD  Score >8 = High Risk for Opioid Abuse   PHQ-2 Depression Scale:  Total score: 0  PHQ-2 Scoring interpretation table: (Score and probability of major depressive disorder)  Score 0 = No depression  Score 1 = 15.4% Probability  Score 2 = 21.1% Probability  Score 3 = 38.4% Probability  Score 4 = 45.5% Probability   Score 5 = 56.4% Probability  Score 6 =  78.6% Probability   PHQ-9 Depression Scale:  Total score: 0  PHQ-9 Scoring interpretation table:  Score 0-4 = No depression  Score 5-9 = Mild depression  Score 10-14 = Moderate depression  Score 15-19 = Moderately severe depression  Score 20-27 = Severe depression (2.4 times higher risk of SUD and 2.89 times higher risk of overuse)   Pharmacologic Plan: As per protocol, I have not taken over any controlled substance management, pending the results of ordered tests and/or consults.            Initial impression: Pending review of available data and ordered tests.  Meds   Current Outpatient Medications:    ADVAIR HFA 230-21 MCG/ACT inhaler, Inhale 2 puffs into the lungs 2 (two) times daily., Disp: , Rfl:    AJOVY 225 MG/1.5ML SOSY, Inject into the skin., Disp: , Rfl:    albuterol (PROVENTIL HFA;VENTOLIN HFA) 108 (90 BASE) MCG/ACT inhaler, Inhale 2 puffs into the lungs every 6 (six) hours as needed for wheezing or shortness of breath., Disp: , Rfl:    albuterol (PROVENTIL) (2.5 MG/3ML) 0.083% nebulizer solution, Inhale 3 mLs into the lungs every 6 (six) hours as needed for shortness of breath., Disp: , Rfl:    albuterol (PROVENTIL) (2.5 MG/3ML) 0.083% nebulizer solution, Inhale into the lungs., Disp: , Rfl:    ALPRAZolam (XANAX) 1 MG tablet, Take by mouth., Disp: , Rfl:    baclofen (LIORESAL) 10 MG tablet, Take 10 mg by mouth 2 (two) times daily. , Disp: , Rfl:    Certolizumab Pegol (CIMZIA PREFILLED) 2 X 200 MG/ML PSKT, INJECT 400MG (=2 INJECTIONS) SUBCUTANEOUSLY EVERY 28 DAYS AS DIRECTED., Disp: , Rfl:    certolizumab pegol (CIMZIA) 2 X 200 MG KIT, Inject 400 mg into the skin every 30 (thirty) days. Once a month on the 13th, Disp: , Rfl:    CIMZIA PREFILLED 2 X 200 MG/ML KIT, , Disp: , Rfl:    clindamycin (CLEOCIN) 300 MG capsule, Take 300 mg by mouth every 6 (six) hours., Disp: , Rfl:    desipramine (NORPRAMIN) 25 MG tablet, , Disp: , Rfl:     desipramine (NORPRAMIN) 25 MG tablet, Take by mouth., Disp: , Rfl:    dicyclomine (BENTYL) 10 MG capsule, Take 10 mg by mouth every morning. , Disp: , Rfl:    dicyclomine (BENTYL) 10 MG capsule, Take by mouth., Disp: , Rfl:    escitalopram (LEXAPRO) 20 MG tablet, Take by mouth., Disp: , Rfl:    fexofenadine (ALLEGRA) 180 MG tablet, Take by mouth., Disp: , Rfl:    fluticasone-salmeterol (ADVAIR HFA) 115-21 MCG/ACT inhaler, Inhale into the lungs., Disp: , Rfl:    folic acid (FOLVITE) 1 MG tablet, Take by mouth., Disp: , Rfl:    Fremanezumab-vfrm (AJOVY) 225 MG/1.5ML SOSY, Inject into the muscle., Disp: , Rfl:    gabapentin (NEURONTIN) 600 MG tablet, Take by mouth 2 (two) times daily., Disp: , Rfl:    glucose blood (PRECISION QID TEST) test strip, Use 3 (three) times daily e11.65, Disp: , Rfl:    hydrOXYzine (ATARAX) 25 MG tablet, Take 1 tablet by mouth as needed., Disp: , Rfl:    hydrOXYzine (ATARAX/VISTARIL) 25 MG tablet, Take 25 mg by mouth every 8 (eight) hours as needed for anxiety. , Disp: , Rfl:    insulin detemir (LEVEMIR) 100 UNIT/ML injection, Inject 0.14 mLs (14 Units total) into the skin at bedtime., Disp: 10 mL, Rfl: 11   Insulin Glargine (BASAGLAR KWIKPEN) 100 UNIT/ML, Inject into the  skin., Disp: , Rfl:    Insulin Pen Needle (ULTIGUARD SAFEPACK PEN NEEDLE) 32G X 4 MM MISC, Inject into the skin., Disp: , Rfl:    levofloxacin (LEVAQUIN) 500 MG tablet, Take 500 mg by mouth daily., Disp: , Rfl:    LORazepam (ATIVAN) 0.5 MG tablet, Take by mouth., Disp: , Rfl:    methotrexate (RHEUMATREX) 2.5 MG tablet, Take by mouth., Disp: , Rfl:    montelukast (SINGULAIR) 10 MG tablet, Take 10 mg by mouth at bedtime. , Disp: , Rfl:    montelukast (SINGULAIR) 10 MG tablet, Take by mouth., Disp: , Rfl:    nortriptyline (PAMELOR) 10 MG capsule, Take 10 mg by mouth 2 (two) times daily., Disp: , Rfl:    ondansetron (ZOFRAN-ODT) 8 MG disintegrating tablet, Take 8 mg by mouth 2 (two) times daily as needed for  nausea/vomiting., Disp: , Rfl:    ondansetron (ZOFRAN-ODT) 8 MG disintegrating tablet, Take 1 tablet by mouth every 8 (eight) hours as needed., Disp: , Rfl:    Semaglutide,0.25 or 0.5MG/DOS, 2 MG/1.5ML SOPN, Inject into the skin., Disp: , Rfl:    SUMAtriptan (IMITREX) 100 MG tablet, Take 100 mg by mouth as directed., Disp: , Rfl:    tiZANidine (ZANAFLEX) 4 MG tablet, Take by mouth., Disp: , Rfl:    tiZANidine (ZANAFLEX) 4 MG tablet, Take by mouth., Disp: , Rfl:    traMADol (ULTRAM) 50 MG tablet, Take 50 mg by mouth every 6 (six) hours as needed., Disp: , Rfl:    traZODone (DESYREL) 100 MG tablet, Take 200 mg by mouth at bedtime. , Disp: , Rfl:    traZODone (DESYREL) 100 MG tablet, Take by mouth., Disp: , Rfl:    ULTICARE MICRO PEN NEEDLES 32G X 4 MM MISC, , Disp: , Rfl:    Cholecalciferol 25 MCG (1000 UT) tablet, Take by mouth. (Patient not taking: Reported on 05/22/2021), Disp: , Rfl:    Cholecalciferol 25 MCG (1000 UT) tablet, Take by mouth. (Patient not taking: Reported on 05/22/2021), Disp: , Rfl:    cyanocobalamin (,VITAMIN B-12,) 1000 MCG/ML injection, Inject 1,000 mcg into the muscle every 30 (thirty) days.  (Patient not taking: Reported on 05/22/2021), Disp: , Rfl:    famotidine (PEPCID) 20 MG tablet, Take 20 mg by mouth 2 (two) times daily. (Patient not taking: Reported on 05/22/2021), Disp: , Rfl:    gabapentin (NEURONTIN) 800 MG tablet, Take 800 mg by mouth 3 (three) times daily as needed (pain).  (Patient not taking: Reported on 05/22/2021), Disp: , Rfl:    Methylsulfonylmethane (MSM) 1000 MG CAPS, , Disp: , Rfl:    metoCLOPramide (REGLAN) 10 MG tablet, Take by mouth. (Patient not taking: Reported on 05/22/2021), Disp: , Rfl:    Milnacipran (SAVELLA) 50 MG TABS tablet, Take 50 mg by mouth 2 (two) times daily. (Patient not taking: Reported on 05/22/2021), Disp: , Rfl:    Milnacipran (SAVELLA) 50 MG TABS tablet, Take 1 tablet by mouth daily. (Patient not taking: Reported on 05/22/2021), Disp: , Rfl:     NYAMYC powder, , Disp: , Rfl:    ondansetron (ZOFRAN) 8 MG tablet, Take by mouth. (Patient not taking: Reported on 11/02/2019), Disp: , Rfl:    Pancrelipase, Lip-Prot-Amyl, 25000 units CPEP, Take 1 capsule by mouth daily.  (Patient not taking: Reported on 05/22/2021), Disp: , Rfl:    pantoprazole (PROTONIX) 40 MG tablet, Take 1 tablet (40 mg total) by mouth 2 (two) times daily before a meal. (Patient not taking: Reported on 05/22/2021), Disp:  60 tablet, Rfl: 0   pantoprazole (PROTONIX) 40 MG tablet, Take by mouth. (Patient not taking: Reported on 05/22/2021), Disp: , Rfl:    predniSONE (DELTASONE) 10 MG tablet, , Disp: , Rfl:    ranitidine (ZANTAC) 150 MG capsule, Take 150 mg by mouth daily as needed for heartburn. (Patient not taking: Reported on 05/22/2021), Disp: , Rfl:    sucralfate (CARAFATE) 1 g tablet, Take by mouth. (Patient not taking: Reported on 05/22/2021), Disp: , Rfl:    sucralfate (CARAFATE) 1 GM/10ML suspension, Take 1 g by mouth 4 (four) times daily -  with meals and at bedtime. (Patient not taking: Reported on 05/22/2021), Disp: , Rfl:    VICTOZA 18 MG/3ML SOPN, Inject into the skin. (Patient not taking: Reported on 05/22/2021), Disp: , Rfl:    Vitamin D, Ergocalciferol, (DRISDOL) 50000 units CAPS capsule, Take by mouth. (Patient not taking: Reported on 05/22/2021), Disp: , Rfl:   Imaging Review  Cervical Imaging: Cervical MR w/wo contrast: Results for orders placed during the hospital encounter of 03/15/16 MR CERVICAL SPINE W WO CONTRAST  Narrative CLINICAL DATA:  Chronic neck pain for 20 years.  EXAM: MRI CERVICAL SPINE WITHOUT AND WITH CONTRAST  TECHNIQUE: Multiplanar and multiecho pulse sequences of the cervical spine, to include the craniocervical junction and cervicothoracic junction, were obtained without and with intravenous contrast.  CONTRAST:  23m MULTIHANCE GADOBENATE DIMEGLUMINE 529 MG/ML IV SOLN  COMPARISON:  11/19/2013  FINDINGS: Alignment: 1.5 mm degenerative  retrolisthesis at C4-5.  Vertebrae: Degenerative endplate findings most notable at C4-5, C5-6, and C6-7 with associated loss of disc height. Disc desiccation throughout the cervical spine. Left T2 vertebral hemangioma.  Cord: No significant abnormal spinal cord signal is observed. No significant abnormal cord enhancement.  Posterior Fossa, vertebral arteries, paraspinal tissues: Unremarkable  Disc levels:  Additional findings at individual levels are as follows:  C2-3: Unremarkable.  C3-4: Mild to moderate left foraminal stenosis due to uncinate and facet arthropathy. Mild disc bulge.  C4-5: Moderate left and mild to moderate right foraminal stenosis with mild left eccentric central narrowing of the thecal sac due to disc osteophyte complex, uncinate spurring, and mild facet arthropathy.  C5-6: Moderate bilateral foraminal stenosis and mild central narrowing of the thecal sac due to disc bulge, uncinate spurring, and facet arthropathy.  C6-7: Borderline central narrowing of the thecal sac due to disc bulge. Mild uncinate spurring.  C7-T1:  Unremarkable.  IMPRESSION: 1. Cervical spondylosis and degenerative disc disease causing moderate impingement at C4-5 and C5-6, and mild to moderate impingement at C3-4, as detailed above. The central narrowing of the thecal sac components are slightly more notable than on the prior exam, with the foraminal impingement similar to prior. 2. No significant abnormal enhancement in the cord.   Electronically Signed By: WVan ClinesM.D. On: 03/15/2016 10:56  Shoulder Imaging: Shoulder-R MR wo contrast: Results for orders placed during the hospital encounter of 07/25/20 MR SHOULDER RIGHT WO CONTRAST  Narrative CLINICAL DATA:  Right shoulder pain for several years.  EXAM: MRI OF THE RIGHT SHOULDER WITHOUT CONTRAST  TECHNIQUE: Multiplanar, multisequence MR imaging of the shoulder was performed. No intravenous contrast was  administered.  COMPARISON:  None.  FINDINGS: Examination is somewhat limited by motion artifact.  Rotator cuff: Significant rotator cuff tendinopathy/tendinosis. There is thickening and diffuse edema like signal changes with interstitial tears most notably involving the supraspinatus tendon. There are shallow bursal surface tears involving the supraspinatus tendon anteriorly. No full-thickness retracted rotator cuff tear is identified.  Muscles: Mild edema like signal changes along the musculotendinous junction region of the supraspinatus tendon.  Biceps long head:  Intact  Acromioclavicular Joint: Mild/moderate degenerative changes. Type 2-3 acromion. Minimal lateral downsloping and subacromial spurring.  Glenohumeral Joint: Minimal/early degenerative changes. No joint effusion or synovitis.  Labrum:  No definite labral tears.  Bones:  No acute bony findings.  Other: Mild subacromial/subdeltoid bursitis.  IMPRESSION: 1. Significant rotator cuff tendinopathy/tendinosis with interstitial tears and shallow bursal surface tears involving the supraspinatus tendon anteriorly. No full-thickness retracted rotator cuff tear. 2. Intact long head biceps tendon and glenoid labrum. 3. Mild/moderate AC joint degenerative changes and type 2-3 acromion may contribute to bony impingement. 4. Mild subacromial/subdeltoid bursitis.   Electronically Signed By: Marijo Sanes M.D. On: 07/26/2020 11:48  Shoulder-L MR wo contrast: Results for orders placed during the hospital encounter of 07/25/20 MR SHOULDER LEFT WO CONTRAST  Narrative CLINICAL DATA:  Chronic right shoulder pain.  EXAM: MRI OF THE LEFT SHOULDER WITHOUT CONTRAST  TECHNIQUE: Multiplanar, multisequence MR imaging of the shoulder was performed. No intravenous contrast was administered.  COMPARISON:  None.  FINDINGS: Rotator cuff: Significant rotator cuff tendinopathy/tendinosis. This most significantly involves  the supraspinatus tendon which is thickened and appears edematous. There are interstitial tears and areas of bursal and articular surface fraying and fibrillation. There is a small deep bursal surface tear involving the attachment fibers of the supraspinatus tendon anteriorly.  Moderate infraspinatus and mild subscapularis tendinopathy the.  Muscles:  Unremarkable.  Biceps long head:  Intact  Acromioclavicular Joint: Mild to moderate degenerative changes. Type 2-3 acromion. No lateral downsloping. Mild subacromial spurring.  Glenohumeral Joint: Mild/early degenerative changes. No joint effusion or synovitis.  Labrum:  No obvious labral tears.  Bones:  No acute bony findings.  Other: Mild/moderate subacromial/subdeltoid bursitis.  IMPRESSION: 1. Significant rotator cuff tendinopathy/tendinosis with interstitial tears and areas of bursal and articular surface fraying and fibrillation. There is a small deep bursal surface tear involving the attachment fibers of the supraspinatus tendon anteriorly. 2. Intact long head biceps tendon and glenoid labrum. 3. Mild to moderate AC joint degenerative changes and mild subacromial spurring may contribute to bony impingement. 4. Mild/moderate subacromial/subdeltoid bursitis.   Electronically Signed By: Marijo Sanes M.D. On: 07/26/2020 11:00  Foot Imaging: Foot-R DG Complete: Results for orders placed in visit on 08/31/17 DG Foot Complete Right  Narrative Please see detailed radiograph report in office note.  Foot-L DG Complete: Results for orders placed in visit on 08/31/17 DG Foot Complete Left  Narrative Please see detailed radiograph report in office note.  Complexity Note: Imaging results reviewed. Results shared with Caroline Hughes, using Layman's terms.                        ROS  Cardiovascular: No reported cardiovascular signs or symptoms such as High blood pressure, coronary artery disease, abnormal heart rate or  rhythm, heart attack, blood thinner therapy or heart weakness and/or failure Pulmonary or Respiratory: Wheezing and difficulty taking a deep full breath (Asthma), Shortness of breath, and Coughing up mucus (Bronchitis) Neurological: Curved spine Psychological-Psychiatric: Anxiousness and Difficulty sleeping and or falling asleep Gastrointestinal: No reported gastrointestinal signs or symptoms such as vomiting or evacuating blood, reflux, heartburn, alternating episodes of diarrhea and constipation, inflamed or scarred liver, or pancreas or irrregular and/or infrequent bowel movements Genitourinary: No reported renal or genitourinary signs or symptoms such as difficulty voiding or producing urine, peeing blood, non-functioning kidney, kidney stones, difficulty emptying  the bladder, difficulty controlling the flow of urine, or chronic kidney disease Hematological: Weakness due to low blood hemoglobin or red blood cell count (Anemia) and Brusing easily Endocrine: High blood sugar controlled without the use of insulin (NIDDM) Rheumatologic: Generalized muscle aches (Fibromyalgia) Musculoskeletal: Negative for myasthenia gravis, muscular dystrophy, multiple sclerosis or malignant hyperthermia Work History: Disabled  Allergies  Caroline Hughes is allergic to naproxen, penicillins, gluten meal, lactose intolerance (gi), latex, soy allergy, sulfonylureas, doxycycline, humira [adalimumab], other, simvastatin, statins, sulfa antibiotics, sulfasalazine, and vancomycin.  Laboratory Chemistry Profile   Renal Lab Results  Component Value Date   BUN 16 09/24/2019   CREATININE 0.80 07/23/2020   GFRAA >60 09/24/2019   GFRNONAA >60 09/24/2019   SPECGRAV 1.015 12/01/2019   PHUR 5.0 12/01/2019   PROTEINUR Negative 12/01/2019     Electrolytes Lab Results  Component Value Date   NA 136 09/24/2019   K 4.3 09/24/2019   CL 97 (L) 09/24/2019   CALCIUM 9.2 09/24/2019   MG 1.8 11/07/2013     Hepatic Lab  Results  Component Value Date   AST 32 06/15/2017   ALT 16 06/15/2017   ALBUMIN 3.7 06/15/2017   ALKPHOS 85 06/15/2017   LIPASE 33 06/15/2017     ID Lab Results  Component Value Date   HIV Non Reactive 06/15/2017   [place] NEGATIVE 01/21/2019   MRSAPCR NEGATIVE 06/18/2017     Bone No results found for: VD25OH, OJ500XF8HWE, XH3716RC7, EL3810FB5, 25OHVITD1, 25OHVITD2, 25OHVITD3, TESTOFREE, TESTOSTERONE   Endocrine Lab Results  Component Value Date   GLUCOSE 279 (H) 09/24/2019   GLUCOSEU Negative 12/01/2019   HGBA1C 7.3 (H) 06/23/2017     Neuropathy Lab Results  Component Value Date   VITAMINB12 691 06/19/2017   FOLATE 14.5 06/19/2017   HGBA1C 7.3 (H) 06/23/2017   HIV Non Reactive 06/15/2017     CNS No results found for: COLORCSF, APPEARCSF, RBCCOUNTCSF, WBCCSF, POLYSCSF, LYMPHSCSF, EOSCSF, PROTEINCSF, GLUCCSF, JCVIRUS, CSFOLI, IGGCSF, LABACHR, ACETBL, LABACHR, ACETBL   Inflammation (CRP: Acute   ESR: Chronic) Lab Results  Component Value Date   ESRSEDRATE 6 07/27/2017     Rheumatology No results found for: RF, ANA, LABURIC, URICUR, LYMEIGGIGMAB, LYMEABIGMQN, HLAB27   Coagulation Lab Results  Component Value Date   INR 0.81 06/15/2017   LABPROT 11.1 (L) 06/15/2017   APTT 31 06/15/2017   PLT 470 (H) 09/24/2019     Cardiovascular Lab Results  Component Value Date   CKTOTAL 98 07/09/2013   CKMB 2.1 07/09/2013   TROPONINI < 0.02 01/16/2014   HGB 12.9 09/24/2019   HCT 39.5 09/24/2019     Screening Lab Results  Component Value Date   SARSCOV2NAA NEGATIVE 01/21/2019   MRSAPCR NEGATIVE 06/18/2017   HIV Non Reactive 06/15/2017     Cancer No results found for: CEA, CA125, LABCA2   Allergens No results found for: ALMOND, APPLE, ASPARAGUS, AVOCADO, BANANA, BARLEY, BASIL, BAYLEAF, GREENBEAN, LIMABEAN, WHITEBEAN, BEEFIGE, REDBEET, BLUEBERRY, BROCCOLI, CABBAGE, MELON, CARROT, CASEIN, CASHEWNUT, CAULIFLOWER, CELERY     Note: Lab results reviewed.  [place]   Drug: Caroline Hughes  reports no history of drug use. Alcohol:  reports current alcohol use of about 3.0 standard drinks per week. Tobacco:  reports that she has quit smoking. She has never used smokeless tobacco. Medical:  has a past medical history of Anemia, Anxiety, Arthritis, Asthma, Bronchitis (1025), Complication of anesthesia, Crohn's disease ([place]), Depression, Diabetes mellitus without complication ([place]), Endometriosis, Fibromyalgia, Gastric ulcer, GERD (gastroesophageal reflux disease), Herniated disc, cervical, Hyperlipemia, Intestinal  obstruction ([place]), Migraine, Nausea, Neuropathy, Pain, Peristomal hernia, PONV (postoperative nausea and vomiting), and Ulcerative colitis ([place]). Family: family history includes Heart attack in her father; Hyperlipidemia in her father; Hypertension in her father.  Past Surgical History:  Procedure Laterality Date   ABDOMINAL HYSTERECTOMY     CATARACT EXTRACTION W/PHACO Right 11/25/2016   Procedure: CATARACT EXTRACTION PHACO AND INTRAOCULAR LENS PLACEMENT (IOC);  Surgeon: Birder Robson, MD;  Location: [facility] ORS;  Service: Ophthalmology;  Laterality: Right;  [place] 00:40 AP% 17.4 CDE 6.94 Fluid pack lot # [id number]   CATARACT EXTRACTION W/PHACO Left 12/02/2016   Procedure: CATARACT EXTRACTION PHACO AND INTRAOCULAR LENS PLACEMENT (IOC);  Surgeon: Birder Robson, MD;  Location: [facility] ORS;  Service: Ophthalmology;  Laterality: Left;  [place] 00:34 AP% 14.2 CDE 4.94 Flyuid pack lot 3 [id number]   CHOLECYSTECTOMY     COLON RESECTION     ESOPHAGOGASTRODUODENOSCOPY (EGD) WITH PROPOFOL N/A 06/17/2017   Procedure: ESOPHAGOGASTRODUODENOSCOPY (EGD) WITH PROPOFOL;  Surgeon: [name], MD;  Location: [facility] ENDOSCOPY;  Service: Gastroenterology;  Laterality: N/A;   ESOPHAGOGASTRODUODENOSCOPY (EGD) WITH PROPOFOL N/A 06/19/2017   Procedure: ESOPHAGOGASTRODUODENOSCOPY (EGD) WITH PROPOFOL;  Surgeon: [name], MD;  Location: [facility] ENDOSCOPY;  Service: Gastroenterology;   Laterality: N/A;   ESOPHAGOGASTRODUODENOSCOPY (EGD) WITH PROPOFOL N/A 01/26/2019   Procedure: ESOPHAGOGASTRODUODENOSCOPY (EGD) WITH PROPOFOL;  Surgeon: Tol[place], Benay Pike, MD;  Location: [facility] ENDOSCOPY;  Service: Gastroenterology;  Laterality: N/A;   HERNIA REPAIR     MULTIPLE PERISTOMAL   ILEOSCOPY N/A 06/17/2017   Procedure: ILEOSCOPY THROUGH STOMA;  Surgeon: [name], MD;  Location: [facility] ENDOSCOPY;  Service: Gastroenterology;  Laterality: N/A;   ILEOSCOPY N/A 06/23/2017   Procedure: ILEOSCOPY THROUGH STOMA;  Surgeon: Lin Landsman, MD;  Location: [facility] ENDOSCOPY;  Service: Gastroenterology;  Laterality: N/A;   ILEOSTOMY Bilateral    MOVED X 3   rectal fistula packing     X 3   Active Ambulatory Problems    Diagnosis Date Noted   Iron deficiency anemia 02/18/2016   Anxiety and depression 09/24/2012   Cervical myofascial pain syndrome 01/21/2017   Controlled type 2 diabetes mellitus with diabetic nephropathy, with long-term current use of insulin ([place]) 07/09/2016   Fibromyalgia 05/03/2015   DDD (degenerative disc disease), cervical 02/18/2017   Spondylosis of cervical region without myelopathy or radiculopathy 02/18/2017   Chronic pain syndrome 02/18/2017   Crohn's disease ([place]) 12/22/2013   GERD (gastroesophageal reflux disease) 06/15/2017   Melena 06/15/2017   Bladder pain 01/19/2013   Advance directive on file 07/28/2013   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   Atopic dermatitis 11/19/2015   Chronic cystitis 05/03/2012   Chronic urticaria 11/19/2015   COPD (chronic obstructive pulmonary disease) ([place]) 09/24/2012   Diarrhea 08/17/2012   Dysphagia, unspecified 11/19/2015   Essential hypertension 07/24/2017   H/O osteopenia 05/03/2015   History of duodenal ulcer 03/03/2017   Hyperlipidemia, mixed 07/24/2017   Hyperlipidemia 08/10/2014   Incomplete emptying of bladder 01/19/2013   Intractable migraine without status migrainosus 06/28/2014   Irritable bowel syndrome with both  constipation and diarrhea 11/12/2016   Long-term insulin use ([place]) 04/17/2014   Mild persistent asthma 03/29/2014   Osteoarthritis 10/09/2016   Other specified symptom associated with female genital organs 05/03/2012   Palpitations 04/20/2017   Pancreatic insufficiency 03/03/2017   Pelvic floor dysfunction 01/19/2013   Peripheral polyneuropathy 04/17/2014   Piriformis syndrome, right 07/27/2014   Refusal of blood transfusions as patient is Jehovah's Witness 07/28/2013   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[place]) 06/28/2014   Shin splint, right,  initial encounter 07/27/2014   SI (sacroiliac) joint dysfunction 07/27/2014   Trigger middle finger of left hand 10/09/2016   Vitamin D deficiency, unspecified 07/25/2015   Uncontrolled type 2 diabetes mellitus 06/28/2014   Acute non intractable tension-type headache 12/29/2017   Arthritis of both hands 09/20/2018   Peristomal hernia 16/01/9603   Periumbilical abdominal pain 09/23/2017   Chronic shoulder pain (1ry area of Pain) (Bilateral) (R>L) 11/25/2018   Right hand pain 06/16/2018   Chronic low back pain (3ry area of Pain) (Bilateral) (L>R) w/o sciatica 05/25/2019   Chronic neck pain (5th area of Pain) (Bilateral) (R>L) 06/16/2018   Pharmacologic therapy 05/21/2021   Disorder of skeletal system 05/21/2021   Problems influencing health status 05/21/2021   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, C5-6) (Left: C3-4) 05/21/2021   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, C5-6, C6-7) 05/21/2021   Cervical Retrolisthesis (1.5 mm) of C4/C5 05/21/2021   Cervical facet arthropathy (C3-4, C4-5, C5-6) 05/21/2021   Abnormal MRI, shoulder (Bilateral) (07/25/2020) 05/21/2021   Abnormal EMG (electromyogram) (03/18/2021; 03/25/2021) 05/22/2021   Chronic vaginal pain (2ry area of Pain) 05/22/2021   Chronic anal or rectal pain (2ry area of Pain) 05/22/2021   Chronic abdominal pain (4th area of Pain) 05/22/2021   Chronic occipital headache (6th area of Pain) (Bilateral) (R>L)  05/22/2021   Cervical facet syndrome (Bilateral) 05/22/2021   Chronic upper extremity pain (7th area of Pain) (Bilateral) (R>L) 05/22/2021   Resolved Ambulatory Problems    Diagnosis Date Noted   No Resolved Ambulatory Problems   Past Medical History:  Diagnosis Date   Anemia    Anxiety    Arthritis    Asthma    Bronchitis 5409   Complication of anesthesia    Depression    Diabetes mellitus without complication (HCC)    Endometriosis    Gastric ulcer    Herniated disc, cervical    Hyperlipemia    Intestinal obstruction (HCC)    Migraine    Nausea    Neuropathy    Pain    PONV (postoperative nausea and vomiting)    Ulcerative colitis ([place])    Constitutional Exam  General appearance: Well nourished, well developed, and well hydrated. In no apparent acute distress Vitals:   05/22/21 1004  BP: (!) 149/86  Pulse: (!) 110  Resp: 18  Temp: (!) 97.4 F (36.3 C)  TempSrc: Temporal  SpO2: 99%  Weight: 143 lb (64.9 kg)  Height: 5' 1"  (1.549 m)   BMI Assessment: Estimated body mass index is 27.02 kg/m as calculated from the following:   Height as of this encounter: 5' 1"  (1.549 m).   Weight as of this encounter: 143 lb (64.9 kg).  BMI interpretation table: BMI level Category Range association with higher incidence of chronic pain  <18 kg/m2 Underweight   18.5-24.9 kg/m2 Ideal body weight   25-29.9 kg/m2 Overweight Increased incidence by 20%  30-34.9 kg/m2 Obese (Class I) Increased incidence by 68%  35-39.9 kg/m2 Severe obesity (Class II) Increased incidence by 136%  >40 kg/m2 Extreme obesity (Class III) Increased incidence by 254%   Patient's current BMI Ideal Body weight  Body mass index is 27.02 kg/m. Ideal body weight: 47.8 kg (105 lb 6.1 oz) Adjusted ideal body weight: 54.6 kg (120 lb 6.8 oz)   BMI Readings from Last 4 Encounters:  05/22/21 27.02 kg/m  11/02/19 25.35 kg/m  09/24/19 25.61 kg/m  01/26/19 26.34 kg/m   Wt Readings from Last 4 Encounters:   05/22/21 143 lb (64.9 kg)  11/02/19 138 lb 9.6 oz (62.9 kg)  09/24/19 140 lb (63.5 kg)  01/26/19 144 lb (65.3 kg)    Psych/Mental status: Alert, oriented x 3 (person, place, & time)       Eyes: PERLA Respiratory: No evidence of acute respiratory distress  Assessment  Primary Diagnosis & Pertinent Problem List: The primary encounter diagnosis was Chronic pain syndrome. Diagnoses of Chronic shoulder pain (1ry area of Pain) (Bilateral) (R>L), Chronic vaginal pain (2ry area of Pain), Chronic anal or rectal pain (2ry area of Pain), Chronic low back pain (3ry area of Pain) (Bilateral) (L>R) w/o sciatica, Chronic abdominal pain (4th area of Pain), Chronic neck pain (5th area of Pain) (Bilateral) (R>L), Cervical facet syndrome (Bilateral), Cervical facet arthropathy (C3-4, C4-5, C5-6), Cervical Retrolisthesis (1.5 mm) of C4/C5, Chronic occipital headache (6th area of Pain) (Bilateral) (R>L), Chronic upper extremity pain (7th area of Pain) (Bilateral) (R>L), 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, C5-6) (Left: C3-4), 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, C5-6, C6-7), Abnormal EMG (electromyogram) (03/18/2021; 03/25/2021), Abnormal MRI, shoulder (Bilateral) (07/25/2020), Pharmacologic therapy, Disorder of skeletal system, and Problems influencing health status were also pertinent to this visit.  Visit Diagnosis (New problems to examiner): 1. Chronic pain syndrome   2. Chronic shoulder pain (1ry area of Pain) (Bilateral) (R>L)   3. Chronic vaginal pain (2ry area of Pain)   4. Chronic anal or rectal pain (2ry area of Pain)   5. Chronic low back pain (3ry area of Pain) (Bilateral) (L>R) w/o sciatica   6. Chronic abdominal pain (4th area of Pain)   7. Chronic neck pain (5th area of Pain) (Bilateral) (R>L)   8. Cervical facet syndrome (Bilateral)   9. Cervical facet arthropathy (C3-4, C4-5, C5-6)   10. Cervical Retrolisthesis (1.5 mm) of C4/C5   11. Chronic occipital headache (6th area of Pain)  (Bilateral) (R>L)   12. Chronic upper extremity pain (7th area of Pain) (Bilateral) (R>L)   13. 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, C5-6) (Left: C3-4)   14. 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, C5-6, C6-7)   15. Abnormal EMG (electromyogram) (03/18/2021; 03/25/2021)   16. Abnormal MRI, shoulder (Bilateral) (07/25/2020)   17. Pharmacologic therapy   18. Disorder of skeletal system   19. Problems influencing health status    Plan of Care (Initial workup plan)  Note: Caroline Hughes was reminded that as per protocol, today's visit has been an evaluation only. We have not taken over the patient's controlled substance management.  Problem-specific plan: No problem-specific Assessment & Plan notes found for this encounter.  Lab Orders         Compliance Drug Analysis, Ur         Comp. Metabolic Panel (12)         Magnesium         Vitamin B12         Sedimentation rate         25-Hydroxy vitamin D Lcms D2+D3         C-reactive protein     Imaging Orders         DG Cervical Spine With Flex & Extend         DG Lumbar Spine Complete W/Bend     Referral Orders  No referral(s) requested today   Procedure Orders    No procedure(s) ordered today   Pharmacotherapy (current): Medications ordered:  No orders of the defined types were placed in this encounter.  Medications administered during this visit: Ginelle C. Rafter had no medications administered  during this visit.   Pharmacological management options:  Opioid Analgesics: The patient was informed that there is no guarantee that she would be a candidate for opioid analgesics. The decision will be made following CDC guidelines. This decision will be based on the results of diagnostic studies, as well as Caroline Hughes's risk profile.   Membrane stabilizer: To be determined at a later time  Muscle relaxant: To be determined at a later time  NSAID: To be determined at a later time  Other analgesic(s): To be determined at a later  time   Interventional management options: Caroline Hughes was informed that there is no guarantee that she would be a candidate for interventional therapies. The decision will be based on the results of diagnostic studies, as well as Caroline Hughes's risk profile.  Procedure(s) under consideration:  Pending results of ordered studies      Interventional Therapies  Risk   Complexity Considerations:   Estimated body mass index is 27.02 kg/m as calculated from the following:   Height as of this encounter: 5' 1"  (1.549 m).   Weight as of this encounter: 143 lb (64.9 kg). WNL   Planned   Pending:   Pending further evaluation.  At this point even though the patient does have pain in other areas other than the shoulder, she wants to address the shoulder issues first and then perhaps look at the other problems later on.   Under consideration:   Diagnostic bilateral suprascapular nerve block #1  Possible bilateral suprascapular nerve RFA  Diagnostic bilateral AC joint injection #1  Diagnostic bilateral glenohumeral joint injection #1  Diagnostic bilateral subacromial/subdeltoid bursa injection #1    Completed:   None at this time   Therapeutic   Palliative (PRN) options:   None established    Provider-requested follow-up: Return for (62mn), Eval-day (M,W), (F2F), 2nd Visit, for review of ordered tests.  No future appointments.   Note by: FGaspar Cola MD Date: 05/22/2021; Time: 7:29 PM

## 2021-05-22 ENCOUNTER — Encounter: Payer: Self-pay | Admitting: Pain Medicine

## 2021-05-22 ENCOUNTER — Ambulatory Visit: Payer: Medicare Other | Attending: Pain Medicine | Admitting: Pain Medicine

## 2021-05-22 ENCOUNTER — Other Ambulatory Visit: Payer: Self-pay

## 2021-05-22 VITALS — BP 149/86 | HR 110 | Temp 97.4°F | Resp 18 | Ht 61.0 in | Wt 143.0 lb

## 2021-05-22 DIAGNOSIS — R102 Pelvic and perineal pain: Secondary | ICD-10-CM

## 2021-05-22 DIAGNOSIS — M4310 Spondylolisthesis, site unspecified: Secondary | ICD-10-CM | POA: Diagnosis present

## 2021-05-22 DIAGNOSIS — R519 Headache, unspecified: Secondary | ICD-10-CM

## 2021-05-22 DIAGNOSIS — M25512 Pain in left shoulder: Secondary | ICD-10-CM

## 2021-05-22 DIAGNOSIS — R109 Unspecified abdominal pain: Secondary | ICD-10-CM | POA: Diagnosis present

## 2021-05-22 DIAGNOSIS — K6289 Other specified diseases of anus and rectum: Secondary | ICD-10-CM | POA: Diagnosis present

## 2021-05-22 DIAGNOSIS — G8929 Other chronic pain: Secondary | ICD-10-CM

## 2021-05-22 DIAGNOSIS — M4802 Spinal stenosis, cervical region: Secondary | ICD-10-CM | POA: Diagnosis present

## 2021-05-22 DIAGNOSIS — M47812 Spondylosis without myelopathy or radiculopathy, cervical region: Secondary | ICD-10-CM

## 2021-05-22 DIAGNOSIS — M899 Disorder of bone, unspecified: Secondary | ICD-10-CM

## 2021-05-22 DIAGNOSIS — M542 Cervicalgia: Secondary | ICD-10-CM | POA: Diagnosis present

## 2021-05-22 DIAGNOSIS — G894 Chronic pain syndrome: Secondary | ICD-10-CM

## 2021-05-22 DIAGNOSIS — M25511 Pain in right shoulder: Secondary | ICD-10-CM

## 2021-05-22 DIAGNOSIS — M545 Low back pain, unspecified: Secondary | ICD-10-CM | POA: Diagnosis present

## 2021-05-22 DIAGNOSIS — Z789 Other specified health status: Secondary | ICD-10-CM

## 2021-05-22 DIAGNOSIS — R94131 Abnormal electromyogram [EMG]: Secondary | ICD-10-CM | POA: Diagnosis present

## 2021-05-22 DIAGNOSIS — M79601 Pain in right arm: Secondary | ICD-10-CM | POA: Diagnosis present

## 2021-05-22 DIAGNOSIS — M79602 Pain in left arm: Secondary | ICD-10-CM | POA: Diagnosis present

## 2021-05-22 DIAGNOSIS — R936 Abnormal findings on diagnostic imaging of limbs: Secondary | ICD-10-CM | POA: Diagnosis present

## 2021-05-22 DIAGNOSIS — Z79899 Other long term (current) drug therapy: Secondary | ICD-10-CM | POA: Diagnosis present

## 2021-05-23 ENCOUNTER — Ambulatory Visit
Admission: RE | Admit: 2021-05-23 | Discharge: 2021-05-23 | Disposition: A | Discharge status: Home / Self Care | Payer: Medicare Other | Source: Ambulatory Visit | Attending: Pain Medicine | Admitting: Pain Medicine

## 2021-05-23 DIAGNOSIS — M25512 Pain in left shoulder: Secondary | ICD-10-CM | POA: Insufficient documentation

## 2021-05-23 DIAGNOSIS — M545 Low back pain, unspecified: Secondary | ICD-10-CM

## 2021-05-23 DIAGNOSIS — M79602 Pain in left arm: Secondary | ICD-10-CM | POA: Diagnosis present

## 2021-05-23 DIAGNOSIS — M542 Cervicalgia: Secondary | ICD-10-CM | POA: Diagnosis present

## 2021-05-23 DIAGNOSIS — M47812 Spondylosis without myelopathy or radiculopathy, cervical region: Secondary | ICD-10-CM

## 2021-05-23 DIAGNOSIS — G8929 Other chronic pain: Secondary | ICD-10-CM | POA: Diagnosis present

## 2021-05-23 DIAGNOSIS — M79601 Pain in right arm: Secondary | ICD-10-CM | POA: Diagnosis present

## 2021-05-23 DIAGNOSIS — M4310 Spondylolisthesis, site unspecified: Secondary | ICD-10-CM | POA: Insufficient documentation

## 2021-05-23 DIAGNOSIS — M4802 Spinal stenosis, cervical region: Secondary | ICD-10-CM

## 2021-05-23 DIAGNOSIS — R519 Headache, unspecified: Secondary | ICD-10-CM | POA: Insufficient documentation

## 2021-05-23 DIAGNOSIS — M25511 Pain in right shoulder: Secondary | ICD-10-CM | POA: Diagnosis present

## 2021-05-30 LAB — COMP. METABOLIC PANEL (12)
AST: 27 IU/L (ref 0–40)
Albumin/Globulin Ratio: 1.5 (ref 1.2–2.2)
Albumin: 4.6 g/dL (ref 3.8–4.8)
Alkaline Phosphatase: 121 IU/L (ref 44–121)
BUN/Creatinine Ratio: 12 (ref 12–28)
BUN: 12 mg/dL (ref 8–27)
Bilirubin Total: 0.4 mg/dL (ref 0.0–1.2)
Calcium: 10.3 mg/dL (ref 8.7–10.3)
Chloride: 95 mmol/L — ABNORMAL LOW (ref 96–106)
Creatinine, Ser: 1.04 mg/dL — ABNORMAL HIGH (ref 0.57–1.00)
Globulin, Total: 3.1 g/dL (ref 1.5–4.5)
Glucose: 61 mg/dL — ABNORMAL LOW (ref 70–99)
Potassium: 4.3 mmol/L (ref 3.5–5.2)
Sodium: 136 mmol/L (ref 134–144)
Total Protein: 7.7 g/dL (ref 6.0–8.5)
eGFR: 61 mL/min/{1.73_m2} (ref >59)

## 2021-05-30 LAB — MAGNESIUM: Magnesium: 1.7 mg/dL (ref 1.6–2.3)

## 2021-05-30 LAB — C-REACTIVE PROTEIN: CRP: 7 mg/L (ref 0–10)

## 2021-05-30 LAB — 25-HYDROXY VITAMIN D LCMS D2+D3
25-Hydroxy, Vitamin D-2: 2.1 ng/mL
25-Hydroxy, Vitamin D-3: 55 ng/mL
25-Hydroxy, Vitamin D: 57 ng/mL

## 2021-05-30 LAB — SEDIMENTATION RATE: Sed Rate: 31 mm/hr (ref 0–40)

## 2021-05-30 LAB — VITAMIN B12: Vitamin B-12: 703 pg/mL (ref 232–1245)

## 2021-06-01 LAB — COMPLIANCE DRUG ANALYSIS, UR

## 2021-06-22 DIAGNOSIS — R892 Abnormal level of other drugs, medicaments and biological substances in specimens from other organs, systems and tissues: Secondary | ICD-10-CM | POA: Insufficient documentation

## 2021-06-22 NOTE — Progress Notes (Signed)
PROVIDER NOTE: Information contained herein reflects review and annotations entered in association with encounter. Interpretation of such information and data should be left to medically-trained personnel. Information provided to patient can be located elsewhere in the medical record under "Patient Instructions". Document created using STT-dictation technology, any transcriptional errors that may result from process are unintentional.    Patient: Caroline Hughes  Service Category: E/M  Provider: Gaspar Cola, MD  DOB: 03/11/1961  DOS: 06/24/2021  Specialty: Interventional Pain Management  MRN: [id number]  Setting: Ambulatory outpatient  PCP: Sharyne Peach, MD  Type: Established Patient    Referring Provider: Sharyne Peach, MD  Location: Office  Delivery: Face-to-face     Primary Reason(s) for Visit: Encounter for evaluation before starting new chronic pain management plan of care (Level of risk: moderate) CC: Shoulder Pain (bilateral)  HPI  Caroline Hughes is a 61 y.o. year old, female patient, who comes today for a follow-up evaluation to review the test results and decide on a treatment plan. She has Iron deficiency anemia; Anxiety and depression; Cervical myofascial pain syndrome; Controlled type 2 diabetes mellitus with diabetic nephropathy, with long-term current use of insulin ([place]); Fibromyalgia; DDD (degenerative disc disease), cervical; Spondylosis of cervical region without myelopathy or radiculopathy; Chronic pain syndrome; Crohn's disease ([place]); GERD (gastroesophageal reflux disease); Melena; Bladder pain; Advance directive on file; Atony of bladder; Atopic dermatitis; Chronic cystitis; Chronic urticaria; COPD (chronic obstructive pulmonary disease) ([place]); Diarrhea; Dysphagia, unspecified; Essential hypertension; H/O osteopenia; History of duodenal ulcer; Hyperlipidemia, mixed; Hyperlipidemia; Incomplete emptying of bladder; Intractable migraine without status migrainosus; Irritable  bowel syndrome with both constipation and diarrhea; Long-term insulin use ([place]); Mild persistent asthma; Osteoarthritis; Other specified symptom associated with female genital organs; Palpitations; Pancreatic insufficiency; Pelvic floor dysfunction; Peripheral polyneuropathy; Piriformis syndrome, right; Refusal of blood transfusions as patient is Jehovah's Witness; 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[place]); Shin splint, right, initial encounter; SI (sacroiliac) joint dysfunction; Trigger middle finger of left hand; Vitamin D deficiency, unspecified; Uncontrolled type 2 diabetes mellitus; Acute non intractable tension-type headache; Arthritis of both hands; Peristomal hernia; Periumbilical abdominal pain; Chronic shoulder pain (1ry area of Pain) (Bilateral) (R>L); Right hand pain; Chronic low back pain (3ry area of Pain) (Bilateral) (L>R) w/o sciatica; Chronic neck pain (5th area of Pain) (Bilateral) (R>L); Pharmacologic therapy; Disorder of skeletal system; Problems influencing health status; 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, C5-6) (Left: C3-4); 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, C5-6, C6-7); Cervical Retrolisthesis (1.5 mm) of C4/C5; Cervical facet arthropathy (C3-4, C4-5, C5-6); Abnormal MRI, shoulder (Bilateral) (07/25/2020); Abnormal EMG (electromyogram) (03/18/2021; 03/25/2021); Chronic vaginal pain (2ry area of Pain); Chronic anal or rectal pain (2ry area of Pain); Chronic abdominal pain (4th area of Pain); Chronic occipital headache (6th area of Pain) (Bilateral) (R>L); Cervical facet syndrome (Bilateral); Chronic upper extremity pain (7th area of Pain) (Bilateral) (R>L); Abnormal drug screen (05/22/2021); Grade 1 (18m) Anterolisthesis of cervical spine (C3/C4); Tendinopathy of rotator cuff (Right); Supraspinatus syndrome (Right); Subacromial bursitis of shoulder joint (Right); Subdeltoid bursitis (Right); Osteoarthritis of AC (acromioclavicular) joint (Right); Tendinopathy of rotator cuff (Left); Supraspinatus  syndrome (Left); Osteoarthritis of AC (acromioclavicular) joint (Left); Subacromial bursitis of shoulder joint (Left); Subdeltoid bursitis (Left); Lumbar facet arthropathy; Lumbar facet syndrome; Pseudoarthrosis of lumbar spine (Right: L5-S1); and Bertolotti's syndrome (Right: L5-S1) on their problem list. Her primarily concern today is the Shoulder Pain (bilateral)  Pain Assessment: Location:   Shoulder Radiating: down arms bilateral to elbows,and up neck Onset: More than a month ago Duration: Chronic pain Quality: Burning, Aching, Constant  Severity: 8 /10 (subjective, self-reported pain score)  Effect on ADL: ADL's Timing:   Modifying factors: heat, ice BP: (!) 165/75   HR: 97  Caroline Hughes comes in today for a follow-up visit after her initial evaluation on 05/22/2021. Today we went over the results of her tests. These were explained in "Layman's terms". During today's appointment we went over my diagnostic impression, as well as the proposed treatment plan.  Review of initial evaluation (05/22/2021): "According to the patient the primary area of pain is that of the shoulders (Bilateral) (R>L).  The patient denies any prior surgeries but does indicate having had physical therapy for the shoulders years ago.  She also indicates having had a recent MRI of the shoulder and has had total of 4 different joint injections that apparently were done by Lattie Corns, PA-C.  She refers that the only injection that helped was the second 1 that was done which provided her with almost 3 months of pain relief.  All of the other injections did not help.  The pain pattern for that shoulder pain is equal bilaterally with also the pain being in the suprascapular, deltoid, and outer upper arm regions with pain on the lateral anterior aspect of the upper arm suggesting biceps tendinopathy vs suprascapular muscle referred pain.  (07/25/2020) bilateral shoulder MRIs revealed: (Right) IMPRESSION: 1. Significant  rotator cuff tendinopathy/tendinosis with interstitial tears and shallow bursal surface tears involving the supraspinatus tendon anteriorly. No full-thickness retracted rotator cuff tear. 2. Intact long head biceps tendon and glenoid labrum. 3. Mild/moderate AC joint degenerative changes and type 2-3 acromion may contribute to bony impingement. 4. Mild subacromial/subdeltoid bursitis.   (Left) IMPRESSION: 1. Significant rotator cuff tendinopathy/tendinosis with interstitial tears and areas of bursal and articular surface fraying and fibrillation. There is a small deep bursal surface tear involving the attachment fibers of the supraspinatus tendon anteriorly. 2. Intact long head biceps tendon and glenoid labrum. 3. Mild to moderate AC joint degenerative changes and mild subacromial spurring may contribute to bony impingement. 4. Mild/moderate subacromial/subdeltoid bursitis.  The patient indicates having had nerve conduction test of the upper and lower extremities.  The lower extremity EMG/PNCV done on 03/18/2021 was read as normal.  In the case of the upper extremity EMG/PNCV done on 03/25/2021 it was read as abnormal with electrodiagnostic evidence of a right mild (grade 2) carpal tunnel syndrome (median nerve entrapment at wrist).  In addition it also showed electrodiagnostic evidence of a right ulnar neuropathy (site undetermined).  The patient's secondary area pain is that of the anal and vaginal region.  The patient indicates having had multiple surgeries due to ulcerative colitis with the last 1 having been around 1982.  She denies any recent x-rays but she does admit to having had physical therapy twice for pelvic floor exercises.  She denies any nerve blocks or interventional pain management treatments of that area.  The patient's third area pain is out of the lower back (Bilateral) (L>R).  The patient denies any prior surgeries, recent x-rays, physical therapy, or any type of nerve blocks or  joint injections.  The patient's fourth area pain is that of the abdomen where she refers having chronic abdominal pain secondary to 5 different hernias that she has and significant amount of scar tissue from her prior surgeries.  This pain is described to be in the mid abdominal region bilaterally going on across her anterior abdomen.  She describes having had 9 prior surgeries.  She denies any nerve  blocks in that area, physical therapy, or any recent imaging.  Physical exam: Range of motion of the shoulder joints seems to be somewhat limited due to pain.   In considering the treatment plan options, Caroline Hughes was reminded that I no longer take patients for medication management only. I asked her to let me know if she had no intention of taking advantage of the interventional therapies, so that we could make arrangements to provide this space to someone interested. I also made it clear that undergoing interventional therapies for the purpose of getting pain medications is very inappropriate on the part of a patient, and it will not be tolerated in this practice. This type of behavior would suggest true addiction and therefore it requires referral to an addiction specialist.   Further details on both, my assessment(s), as well as the proposed treatment plan, please see below.  Controlled Substance Pharmacotherapy Assessment REMS (Risk Evaluation and Mitigation Strategy)  Opioid Analgesic: None MME/day: 0 mg/day  Pill Count: None expected due to no prior prescriptions written by our practice. Ignatius Specking, RN  06/24/2021 11:20 AM  Sign when Signing Visit Safety precautions to be maintained throughout the outpatient stay will include: orient to surroundings, keep bed in low position, maintain call bell within reach at all times, provide assistance with transfer out of bed and ambulation.    Pharmacokinetics: Liberation and absorption (onset of action): WNL Distribution (time to peak effect):  WNL Metabolism and excretion (duration of action): WNL         Pharmacodynamics: Desired effects: Analgesia: Caroline Hughes reports >50% benefit. Functional ability: Patient reports that medication allows her to accomplish basic ADLs Clinically meaningful improvement in function (CMIF): Sustained CMIF goals met Perceived effectiveness: Described as relatively effective, allowing for increase in activities of daily living (ADL) Undesirable effects: Side-effects or Adverse reactions: None reported Monitoring: [place] PMP: PDMP reviewed during this encounter. Online review of the past 13-monthperiod previously conducted. Not applicable at this point since we have not taken over the patient's medication management yet. List of other Serum/Urine Drug Screening Test(s):  No results found for: AMPHSCRSER, BARBSCRSER, BENZOSCRSER, COCAINSCRSER, COCAINSCRNUR, PCPSCRSER, THCSCRSER, THCU, CANNABQUANT, [place] EOxlyList of all UDS test(s) done:  Lab Results  Component Value Date   SUMMARY Note 05/22/2021   SUMMARY FINAL 02/18/2017   Last UDS on record: Summary  Date Value Ref Range Status  05/22/2021 Note  Final    Comment:    ==================================================================== Compliance Drug Analysis, Ur ==================================================================== Test                             Result       Flag       Units  Drug Present and Declared for Prescription Verification   Tramadol                       >5882        EXPECTED   ng/mg creat   O-Desmethyltramadol            >5882        EXPECTED   ng/mg creat   N-Desmethyltramadol            >5882        EXPECTED   ng/mg creat    Source of tramadol is a prescription medication. O-desmethyltramadol    and N-desmethyltramadol are expected metabolites of tramadol.    Tizanidine  PRESENT      EXPECTED   Citalopram                     PRESENT      EXPECTED    Desmethylcitalopram            PRESENT      EXPECTED    Desmethylcitalopram is an expected metabolite of citalopram or the    enantiomeric form, escitalopram.    Desipramine                    PRESENT      EXPECTED    Desipramine may be administered as a prescription drug; it is also    an expected metabolite of imipramine.    Trazodone                      PRESENT      EXPECTED   1,3 chlorophenyl piperazine    PRESENT      EXPECTED    1,3-chlorophenyl piperazine is an expected metabolite of trazodone.    Hydroxyzine                    PRESENT      EXPECTED  Drug Present not Declared for Prescription Verification   Alcohol, Ethyl                 0.036        UNEXPECTED g/dL    Sources of ethyl alcohol include alcoholic beverages or as a    fermentation product of glucose; glucose was not detected in this    specimen. Ethyl alcohol result should be interpreted in the context    of all available clinical and behavioral information.    Carboxy-THC                    13           UNEXPECTED ng/mg creat    Carboxy-THC is a metabolite of tetrahydrocannabinol (THC). Source of    THC is most commonly herbal marijuana or marijuana-based products,    but THC is also present in a scheduled prescription medication.    Trace amounts of THC can be present in hemp and cannabidiol (CBD)    products. This test is not intended to distinguish between delta-9-    tetrahydrocannabinol, the predominant form of THC in most herbal or    marijuana-based products, and delta-8-tetrahydrocannabinol.    Acetaminophen                  PRESENT      UNEXPECTED   Diphenhydramine                PRESENT      UNEXPECTED  Drug Absent but Declared for Prescription Verification   Alprazolam                     Not Detected UNEXPECTED ng/mg creat   Lorazepam                      Not Detected UNEXPECTED ng/mg creat   Gabapentin                     Not Detected UNEXPECTED   Baclofen                       Not Detected  UNEXPECTED  Nortriptyline                  Not Detected UNEXPECTED   Milnacipran                    Not Detected UNEXPECTED ==================================================================== Test                      Result    Flag   Units      Ref Range   Creatinine              85               mg/dL      >=20 ==================================================================== Declared Medications:  The flagging and interpretation on this report are based on the  following declared medications.  Unexpected results may arise from  inaccuracies in the declared medications.   **Note: The testing scope of this panel includes these medications:   Alprazolam (Xanax)  Baclofen  Desipramine  Escitalopram (Lexapro)  Gabapentin (Neurontin)  Hydroxyzine (Atarax)  Lorazepam (Ativan)  Milnacipran (Savella)  Nortriptyline (Pamelor)  Tramadol (Ultram)  Trazodone (Desyrel)   **Note: The testing scope of this panel does not include small to  moderate amounts of these reported medications:   Tizanidine (Zanaflex)   **Note: The testing scope of this panel does not include the  following reported medications:   Albuterol  Certolizumab (Cimzia)  Cholecalciferol  Clindamycin (Cleocin)  Dicyclomine (Bentyl)  Famotidine (Pepcid)  Fexofenadine (Allegra)  Fluticasone (Advair)  Folic Acid  Fremanezumab (Ajovy)  Insulin (Levemir)  Levofloxacin (Levaquin)  Liraglutide (Victoza)  Methotrexate  Methylsulfonylmethane  Metoclopramide (Reglan)  Montelukast (Singulair)  Nystatin  Ondansetron  Pancrelipase  Pantoprazole (Protonix)  Prednisone  Ranitidine (Zantac)  Salmeterol (Advair)  Semaglutide  Sucralfate (Carafate)  Sumatriptan (Imitrex)  Vitamin B12  Vitamin D2 (Drisdol) ==================================================================== For clinical consultation, please call [phone]. ====================================================================    UDS  interpretation: Unexpected findings: Undeclared illicit substance detected Medication Assessment Form: Not applicable. No opioids. Treatment compliance: Not applicable Risk Assessment Profile: Aberrant behavior: See initial evaluations. None observed or detected today Comorbid factors increasing risk of overdose: See initial evaluation. No additional risks detected today Opioid risk tool (ORT):  Opioid Risk  06/24/2021  Alcohol -  Illegal Drugs -  Rx Drugs -  Alcohol 0  Illegal Drugs 0  Rx Drugs 0  Age between 16-45 years  -  History of Preadolescent Sexual Abuse -  Psychological Disease 2  ADD Negative  OCD Negative  Bipolar Negative  Depression -  Opioid Risk Tool Scoring 2  Opioid Risk Interpretation Low Risk    ORT Scoring interpretation table:  Score <3 = Low Risk for SUD  Score between 4-7 = Moderate Risk for SUD  Score >8 = High Risk for Opioid Abuse   Risk of substance use disorder (SUD): Moderate  Risk Mitigation Strategies:  Patient opioid safety counseling: No controlled substances prescribed. Patient-Prescriber Agreement (PPA): No agreement signed.  Controlled substance notification to other providers: None required. No opioid therapy.  Pharmacologic Plan: Non-opioid analgesic therapy offered. Interventional alternatives discussed.             Laboratory Chemistry Profile   Renal Lab Results  Component Value Date   BUN 12 05/22/2021   CREATININE 1.04 (H) 05/22/2021   BCR 12 05/22/2021   GFRAA >60 09/24/2019   GFRNONAA >60 09/24/2019   SPECGRAV 1.015 12/01/2019   PHUR 5.0 12/01/2019   PROTEINUR Negative 12/01/2019  Electrolytes Lab Results  Component Value Date   NA 136 05/22/2021   K 4.3 05/22/2021   CL 95 (L) 05/22/2021   CALCIUM 10.3 05/22/2021   MG 1.7 05/22/2021     Hepatic Lab Results  Component Value Date   AST 27 05/22/2021   ALT 16 06/15/2017   ALBUMIN 4.6 05/22/2021   ALKPHOS 121 05/22/2021   LIPASE 33 06/15/2017      ID Lab Results  Component Value Date   HIV Non Reactive 06/15/2017   SARSCOV2NAA NEGATIVE 01/21/2019   MRSAPCR NEGATIVE 06/18/2017     Bone Lab Results  Component Value Date   25OHVITD1 57 05/22/2021   25OHVITD2 2.1 05/22/2021   25OHVITD3 55 05/22/2021     Endocrine Lab Results  Component Value Date   GLUCOSE 61 (L) 05/22/2021   GLUCOSEU Negative 12/01/2019   HGBA1C 7.3 (H) 06/23/2017     Neuropathy Lab Results  Component Value Date   VITAMINB12 703 05/22/2021   FOLATE 14.5 06/19/2017   HGBA1C 7.3 (H) 06/23/2017   HIV Non Reactive 06/15/2017     CNS No results found for: COLORCSF, APPEARCSF, RBCCOUNTCSF, WBCCSF, POLYSCSF, LYMPHSCSF, EOSCSF, PROTEINCSF, GLUCCSF, JCVIRUS, CSFOLI, IGGCSF, LABACHR, ACETBL, LABACHR, ACETBL   Inflammation (CRP: Acute   ESR: Chronic) Lab Results  Component Value Date   CRP 7 05/22/2021   ESRSEDRATE 31 05/22/2021     Rheumatology No results found for: RF, ANA, LABURIC, URICUR, LYMEIGGIGMAB, LYMEABIGMQN, HLAB27   Coagulation Lab Results  Component Value Date   INR 0.81 06/15/2017   LABPROT 11.1 (L) 06/15/2017   APTT 31 06/15/2017   PLT 470 (H) 09/24/2019     Cardiovascular Lab Results  Component Value Date   CKTOTAL 98 07/09/2013   CKMB 2.1 07/09/2013   TROPONINI < 0.02 01/16/2014   HGB 12.9 09/24/2019   HCT 39.5 09/24/2019     Screening Lab Results  Component Value Date   SARSCOV2NAA NEGATIVE 01/21/2019   MRSAPCR NEGATIVE 06/18/2017   HIV Non Reactive 06/15/2017     Cancer No results found for: CEA, CA125, LABCA2   Allergens No results found for: ALMOND, APPLE, ASPARAGUS, AVOCADO, BANANA, BARLEY, BASIL, BAYLEAF, GREENBEAN, LIMABEAN, WHITEBEAN, BEEFIGE, REDBEET, BLUEBERRY, BROCCOLI, CABBAGE, MELON, CARROT, CASEIN, CASHEWNUT, CAULIFLOWER, CELERY     Note: Lab results reviewed.  Recent Diagnostic Imaging Review  Cervical Imaging: Cervical MR w/wo contrast: Results for orders placed during the hospital encounter of  03/15/16 MR CERVICAL SPINE W WO CONTRAST  Narrative CLINICAL DATA:  Chronic neck pain for 20 years.  EXAM: MRI CERVICAL SPINE WITHOUT AND WITH CONTRAST  TECHNIQUE: Multiplanar and multiecho pulse sequences of the cervical spine, to include the craniocervical junction and cervicothoracic junction, were obtained without and with intravenous contrast.  CONTRAST:  64m MULTIHANCE GADOBENATE DIMEGLUMINE 529 MG/ML IV SOLN  COMPARISON:  11/19/2013  FINDINGS: Alignment: 1.5 mm degenerative retrolisthesis at C4-5.  Vertebrae: Degenerative endplate findings most notable at C4-5, C5-6, and C6-7 with associated loss of disc height. Disc desiccation throughout the cervical spine. Left T2 vertebral hemangioma.  Cord: No significant abnormal spinal cord signal is observed. No significant abnormal cord enhancement.  Posterior Fossa, vertebral arteries, paraspinal tissues: Unremarkable  Disc levels:  Additional findings at individual levels are as follows:  C2-3: Unremarkable.  C3-4: Mild to moderate left foraminal stenosis due to uncinate and facet arthropathy. Mild disc bulge.  C4-5: Moderate left and mild to moderate right foraminal stenosis with mild left eccentric central narrowing of the thecal sac due to disc  osteophyte complex, uncinate spurring, and mild facet arthropathy.  C5-6: Moderate bilateral foraminal stenosis and mild central narrowing of the thecal sac due to disc bulge, uncinate spurring, and facet arthropathy.  C6-7: Borderline central narrowing of the thecal sac due to disc bulge. Mild uncinate spurring.  C7-T1:  Unremarkable.  IMPRESSION: 1. Cervical spondylosis and degenerative disc disease causing moderate impingement at C4-5 and C5-6, and mild to moderate impingement at C3-4, as detailed above. The central narrowing of the thecal sac components are slightly more notable than on the prior exam, with the foraminal impingement similar to prior. 2. No  significant abnormal enhancement in the cord.   Electronically Signed By: Van Clines M.D. On: 03/15/2016 10:56  Cervical DG Bending/F/E views: Results for orders placed during the hospital encounter of 05/23/21 DG Cervical Spine With Flex & Extend  Narrative CLINICAL DATA:  Chronic neck pain  EXAM: CERVICAL SPINE COMPLETE WITH FLEXION AND EXTENSION VIEWS  COMPARISON:  03/15/2016  FINDINGS: There is no evidence of cervical spine fracture or prevertebral soft tissue swelling. In flexion there is 3 mm grade 1 anterolisthesis of C3 on C4, which reduces upon extension. Advanced intervertebral disc height loss at C6-7. Mild disc space narrowing and endplate osteophytes at C4-5 and C5-6. Mild facet and uncovertebral arthropathy with slight bony foraminal crowding bilaterally at C5-6.  IMPRESSION: 1. Multilevel degenerative disc disease, most pronounced at C5-6 and C6-7. 2. Grade 1 anterolisthesis of C3 on C4 in flexion, which reduces upon extension.   Electronically Signed By: Davina Poke D.O. On: 05/24/2021 10:10  Shoulder Imaging: Shoulder-R MR wo contrast: Results for orders placed during the hospital encounter of 07/25/20 MR SHOULDER RIGHT WO CONTRAST  Narrative CLINICAL DATA:  Right shoulder pain for several years.  EXAM: MRI OF THE RIGHT SHOULDER WITHOUT CONTRAST  TECHNIQUE: Multiplanar, multisequence MR imaging of the shoulder was performed. No intravenous contrast was administered.  COMPARISON:  None.  FINDINGS: Examination is somewhat limited by motion artifact.  Rotator cuff: Significant rotator cuff tendinopathy/tendinosis. There is thickening and diffuse edema like signal changes with interstitial tears most notably involving the supraspinatus tendon. There are shallow bursal surface tears involving the supraspinatus tendon anteriorly. No full-thickness retracted rotator cuff tear is identified.  Muscles: Mild edema like signal changes  along the musculotendinous junction region of the supraspinatus tendon.  Biceps long head:  Intact  Acromioclavicular Joint: Mild/moderate degenerative changes. Type 2-3 acromion. Minimal lateral downsloping and subacromial spurring.  Glenohumeral Joint: Minimal/early degenerative changes. No joint effusion or synovitis.  Labrum:  No definite labral tears.  Bones:  No acute bony findings.  Other: Mild subacromial/subdeltoid bursitis.  IMPRESSION: 1. Significant rotator cuff tendinopathy/tendinosis with interstitial tears and shallow bursal surface tears involving the supraspinatus tendon anteriorly. No full-thickness retracted rotator cuff tear. 2. Intact long head biceps tendon and glenoid labrum. 3. Mild/moderate AC joint degenerative changes and type 2-3 acromion may contribute to bony impingement. 4. Mild subacromial/subdeltoid bursitis.   Electronically Signed By: Marijo Sanes M.D. On: 07/26/2020 11:48  Shoulder-L MR wo contrast: Results for orders placed during the hospital encounter of 07/25/20 MR SHOULDER LEFT WO CONTRAST  Narrative CLINICAL DATA:  Chronic right shoulder pain.  EXAM: MRI OF THE LEFT SHOULDER WITHOUT CONTRAST  TECHNIQUE: Multiplanar, multisequence MR imaging of the shoulder was performed. No intravenous contrast was administered.  COMPARISON:  None.  FINDINGS: Rotator cuff: Significant rotator cuff tendinopathy/tendinosis. This most significantly involves the supraspinatus tendon which is thickened and appears edematous. There are interstitial tears  and areas of bursal and articular surface fraying and fibrillation. There is a small deep bursal surface tear involving the attachment fibers of the supraspinatus tendon anteriorly.  Moderate infraspinatus and mild subscapularis tendinopathy the.  Muscles:  Unremarkable.  Biceps long head:  Intact  Acromioclavicular Joint: Mild to moderate degenerative changes. Type 2-3 acromion. No  lateral downsloping. Mild subacromial spurring.  Glenohumeral Joint: Mild/early degenerative changes. No joint effusion or synovitis.  Labrum:  No obvious labral tears.  Bones:  No acute bony findings.  Other: Mild/moderate subacromial/subdeltoid bursitis.  IMPRESSION: 1. Significant rotator cuff tendinopathy/tendinosis with interstitial tears and areas of bursal and articular surface fraying and fibrillation. There is a small deep bursal surface tear involving the attachment fibers of the supraspinatus tendon anteriorly. 2. Intact long head biceps tendon and glenoid labrum. 3. Mild to moderate AC joint degenerative changes and mild subacromial spurring may contribute to bony impingement. 4. Mild/moderate subacromial/subdeltoid bursitis.   Electronically Signed By: Marijo Sanes M.D. On: 07/26/2020 11:00  Lumbosacral Imaging: Lumbar DG Bending views: Results for orders placed during the hospital encounter of 05/23/21 DG Lumbar Spine Complete W/Bend  Narrative CLINICAL DATA:  Low back pain  EXAM: LUMBAR SPINE - COMPLETE WITH BENDING VIEWS  COMPARISON:  11/21/2013  FINDINGS: There is no evidence of lumbar spine fracture. Alignment is normal. No static or dynamic listhesis. Assimilation joint on the right at L5-S1. Mild lower lumbar facet arthropathy. Intervertebral disc spaces are relatively well preserved.  IMPRESSION: 1. Mild lower lumbar facet arthropathy. 2. Assimilation joint again noted on the right at L5-S1.   Electronically Signed By: Davina Poke D.O. On: 05/24/2021 10:14    Foot Imaging: Foot-R DG Complete: Results for orders placed in visit on 08/31/17 DG Foot Complete Right  Narrative Please see detailed radiograph report in office note.  Foot-L DG Complete: Results for orders placed in visit on 08/31/17 DG Foot Complete Left  Narrative Please see detailed radiograph report in office note.  Complexity Note: Imaging results reviewed.  Results shared with Caroline Hughes, using Layman's terms.                        Meds   Current Outpatient Medications:    ADVAIR HFA 230-21 MCG/ACT inhaler, Inhale 2 puffs into the lungs 2 (two) times daily., Disp: , Rfl:    AJOVY 225 MG/1.5ML SOSY, Inject into the skin., Disp: , Rfl:    albuterol (PROVENTIL HFA;VENTOLIN HFA) 108 (90 BASE) MCG/ACT inhaler, Inhale 2 puffs into the lungs every 6 (six) hours as needed for wheezing or shortness of breath., Disp: , Rfl:    albuterol (PROVENTIL) (2.5 MG/3ML) 0.083% nebulizer solution, Inhale 3 mLs into the lungs every 6 (six) hours as needed for shortness of breath., Disp: , Rfl:    ALPRAZolam (XANAX) 1 MG tablet, Take by mouth., Disp: , Rfl:    baclofen (LIORESAL) 10 MG tablet, Take 10 mg by mouth 2 (two) times daily. , Disp: , Rfl:    Certolizumab Pegol (CIMZIA PREFILLED) 2 X 200 MG/ML PSKT, INJECT 400MG (=2 INJECTIONS) SUBCUTANEOUSLY EVERY 28 DAYS AS DIRECTED., Disp: , Rfl:    CIMZIA PREFILLED 2 X 200 MG/ML KIT, , Disp: , Rfl:    clindamycin (CLEOCIN) 300 MG capsule, Take 300 mg by mouth every 6 (six) hours., Disp: , Rfl:    desipramine (NORPRAMIN) 25 MG tablet, , Disp: , Rfl:    dicyclomine (BENTYL) 10 MG capsule, Take 10 mg by mouth every morning. ,  Disp: , Rfl:    escitalopram (LEXAPRO) 20 MG tablet, Take by mouth., Disp: , Rfl:    famotidine (PEPCID) 20 MG tablet, Take 20 mg by mouth 2 (two) times daily., Disp: , Rfl:    fexofenadine (ALLEGRA) 180 MG tablet, Take by mouth., Disp: , Rfl:    fluticasone-salmeterol (ADVAIR HFA) 115-21 MCG/ACT inhaler, Inhale into the lungs., Disp: , Rfl:    folic acid (FOLVITE) 1 MG tablet, Take by mouth., Disp: , Rfl:    Fremanezumab-vfrm (AJOVY) 225 MG/1.5ML SOSY, Inject into the muscle., Disp: , Rfl:    glucose blood (PRECISION QID TEST) test strip, Use 3 (three) times daily e11.65, Disp: , Rfl:    hydrOXYzine (ATARAX) 25 MG tablet, Take 1 tablet by mouth as needed., Disp: , Rfl:    insulin detemir (LEVEMIR)  100 UNIT/ML injection, Inject 0.14 mLs (14 Units total) into the skin at bedtime., Disp: 10 mL, Rfl: 11   Insulin Pen Needle (ULTIGUARD SAFEPACK PEN NEEDLE) 32G X 4 MM MISC, Inject into the skin., Disp: , Rfl:    levofloxacin (LEVAQUIN) 500 MG tablet, Take 500 mg by mouth daily., Disp: , Rfl:    LORazepam (ATIVAN) 0.5 MG tablet, Take by mouth., Disp: , Rfl:    methotrexate (RHEUMATREX) 2.5 MG tablet, Take by mouth., Disp: , Rfl:    montelukast (SINGULAIR) 10 MG tablet, Take 10 mg by mouth at bedtime. , Disp: , Rfl:    nortriptyline (PAMELOR) 10 MG capsule, Take 10 mg by mouth 2 (two) times daily., Disp: , Rfl:    ondansetron (ZOFRAN-ODT) 8 MG disintegrating tablet, Take 8 mg by mouth 2 (two) times daily as needed for nausea/vomiting., Disp: , Rfl:    pantoprazole (PROTONIX) 40 MG tablet, Take 1 tablet (40 mg total) by mouth 2 (two) times daily before a meal., Disp: 60 tablet, Rfl: 0   Semaglutide,0.25 or 0.5MG/DOS, 2 MG/1.5ML SOPN, Inject into the skin., Disp: , Rfl:    sucralfate (CARAFATE) 1 GM/10ML suspension, Take 1 g by mouth 4 (four) times daily -  with meals and at bedtime., Disp: , Rfl:    SUMAtriptan (IMITREX) 100 MG tablet, Take 100 mg by mouth as directed., Disp: , Rfl:    tiZANidine (ZANAFLEX) 4 MG tablet, Take by mouth., Disp: , Rfl:    traMADol (ULTRAM) 50 MG tablet, Take 50 mg by mouth every 6 (six) hours as needed., Disp: , Rfl:    ULTICARE MICRO PEN NEEDLES 32G X 4 MM MISC, , Disp: , Rfl:    albuterol (PROVENTIL) (2.5 MG/3ML) 0.083% nebulizer solution, Inhale into the lungs. (Patient not taking: Reported on 06/24/2021), Disp: , Rfl:    certolizumab pegol (CIMZIA) 2 X 200 MG KIT, Inject 400 mg into the skin every 30 (thirty) days. Once a month on the 13th (Patient not taking: Reported on 06/24/2021), Disp: , Rfl:    Cholecalciferol 25 MCG (1000 UT) tablet, Take by mouth. (Patient not taking: Reported on 05/22/2021), Disp: , Rfl:    Cholecalciferol 25 MCG (1000 UT) tablet, Take by mouth.  (Patient not taking: Reported on 05/22/2021), Disp: , Rfl:    cyanocobalamin (,VITAMIN B-12,) 1000 MCG/ML injection, Inject 1,000 mcg into the muscle every 30 (thirty) days.  (Patient not taking: Reported on 05/22/2021), Disp: , Rfl:    desipramine (NORPRAMIN) 25 MG tablet, Take by mouth. (Patient not taking: Reported on 06/24/2021), Disp: , Rfl:    dicyclomine (BENTYL) 10 MG capsule, Take by mouth. (Patient not taking: Reported on 06/24/2021), Disp: ,  Rfl:    gabapentin (NEURONTIN) 600 MG tablet, Take by mouth 2 (two) times daily. (Patient not taking: Reported on 06/24/2021), Disp: , Rfl:    gabapentin (NEURONTIN) 800 MG tablet, Take 800 mg by mouth 3 (three) times daily as needed (pain).  (Patient not taking: Reported on 05/22/2021), Disp: , Rfl:    hydrOXYzine (ATARAX/VISTARIL) 25 MG tablet, Take 25 mg by mouth every 8 (eight) hours as needed for anxiety.  (Patient not taking: Reported on 06/24/2021), Disp: , Rfl:    Insulin Glargine (BASAGLAR KWIKPEN) 100 UNIT/ML, Inject into the skin. (Patient not taking: Reported on 06/24/2021), Disp: , Rfl:    Methylsulfonylmethane (MSM) 1000 MG CAPS, , Disp: , Rfl:    metoCLOPramide (REGLAN) 10 MG tablet, Take by mouth. (Patient not taking: Reported on 05/22/2021), Disp: , Rfl:    Milnacipran (SAVELLA) 50 MG TABS tablet, Take 50 mg by mouth 2 (two) times daily. (Patient not taking: Reported on 05/22/2021), Disp: , Rfl:    Milnacipran (SAVELLA) 50 MG TABS tablet, Take 1 tablet by mouth daily. (Patient not taking: Reported on 05/22/2021), Disp: , Rfl:    montelukast (SINGULAIR) 10 MG tablet, Take by mouth. (Patient not taking: Reported on 06/24/2021), Disp: , Rfl:    NYAMYC powder, , Disp: , Rfl:    ondansetron (ZOFRAN) 8 MG tablet, Take by mouth. (Patient not taking: Reported on 11/02/2019), Disp: , Rfl:    ondansetron (ZOFRAN-ODT) 8 MG disintegrating tablet, Take 1 tablet by mouth every 8 (eight) hours as needed. (Patient not taking: Reported on 06/24/2021), Disp: , Rfl:     Pancrelipase, Lip-Prot-Amyl, 25000 units CPEP, Take 1 capsule by mouth daily.  (Patient not taking: Reported on 05/22/2021), Disp: , Rfl:    pantoprazole (PROTONIX) 40 MG tablet, Take by mouth. (Patient not taking: Reported on 05/22/2021), Disp: , Rfl:    predniSONE (DELTASONE) 10 MG tablet, , Disp: , Rfl:    ranitidine (ZANTAC) 150 MG capsule, Take 150 mg by mouth daily as needed for heartburn. (Patient not taking: Reported on 05/22/2021), Disp: , Rfl:    sucralfate (CARAFATE) 1 g tablet, Take by mouth. (Patient not taking: Reported on 05/22/2021), Disp: , Rfl:    tiZANidine (ZANAFLEX) 4 MG tablet, Take by mouth. (Patient not taking: Reported on 06/24/2021), Disp: , Rfl:    traZODone (DESYREL) 100 MG tablet, Take 200 mg by mouth at bedtime.  (Patient not taking: Reported on 06/24/2021), Disp: , Rfl:    traZODone (DESYREL) 100 MG tablet, Take by mouth. (Patient not taking: Reported on 06/24/2021), Disp: , Rfl:    VICTOZA 18 MG/3ML SOPN, Inject into the skin. (Patient not taking: Reported on 05/22/2021), Disp: , Rfl:    Vitamin D, Ergocalciferol, (DRISDOL) 50000 units CAPS capsule, Take by mouth. (Patient not taking: Reported on 05/22/2021), Disp: , Rfl:   ROS  Constitutional: Denies any fever or chills Gastrointestinal: No reported hemesis, hematochezia, vomiting, or acute GI distress Musculoskeletal: Denies any acute onset joint swelling, redness, loss of ROM, or weakness Neurological: No reported episodes of acute onset apraxia, aphasia, dysarthria, agnosia, amnesia, paralysis, loss of coordination, or loss of consciousness  Allergies  Caroline Hughes is allergic to naproxen, penicillins, gluten meal, lactose intolerance (gi), latex, soy allergy, sulfonylureas, doxycycline, humira [adalimumab], other, simvastatin, statins, sulfa antibiotics, sulfasalazine, and vancomycin.  [place]  Drug: Caroline Hughes  reports no history of drug use. Alcohol:  reports current alcohol use of about 3.0 standard drinks per  week. Tobacco:  reports that she has quit smoking. She  has never used smokeless tobacco. Medical:  has a past medical history of Anemia, Anxiety, Arthritis, Asthma, Bronchitis (2706), Complication of anesthesia, Crohn's disease ([place]), Depression, Diabetes mellitus without complication ([place]), Endometriosis, Fibromyalgia, Gastric ulcer, GERD (gastroesophageal reflux disease), Herniated disc, cervical, Hyperlipemia, Intestinal obstruction ([place]), Migraine, Nausea, Neuropathy, Pain, Peristomal hernia, PONV (postoperative nausea and vomiting), and Ulcerative colitis ([place]). Surgical: Caroline Hughes  has a past surgical history that includes Abdominal hysterectomy; rectal fistula packing; Ileostomy (Bilateral); Hernia repair; Cholecystectomy; Cataract extraction w/PHACO (Right, 11/25/2016); Colon resection; Cataract extraction w/PHACO (Left, 12/02/2016); Esophagogastroduodenoscopy (egd) with propofol (N/A, 06/17/2017); Ileoscopy (N/A, 06/17/2017); Esophagogastroduodenoscopy (egd) with propofol (N/A, 06/19/2017); Ileoscopy (N/A, 06/23/2017); and Esophagogastroduodenoscopy (egd) with propofol (N/A, 01/26/2019). Family: family history includes Heart attack in her father; Hyperlipidemia in her father; Hypertension in her father.  Constitutional Exam  General appearance: Well nourished, well developed, and well hydrated. In no apparent acute distress Vitals:   06/24/21 1106  BP: (!) 165/75  Pulse: 97  Resp: 16  Temp: (!) 97.3 F (36.3 C)  SpO2: 100%  Weight: 143 lb (64.9 kg)  Height: 5' 1"  (1.549 m)   BMI Assessment: Estimated body mass index is 27.02 kg/m as calculated from the following:   Height as of this encounter: 5' 1"  (1.549 m).   Weight as of this encounter: 143 lb (64.9 kg).  BMI interpretation table: BMI level Category Range association with higher incidence of chronic pain  <18 kg/m2 Underweight   18.5-24.9 kg/m2 Ideal body weight   25-29.9 kg/m2 Overweight Increased incidence by 20%  30-34.9 kg/m2  Obese (Class I) Increased incidence by 68%  35-39.9 kg/m2 Severe obesity (Class II) Increased incidence by 136%  >40 kg/m2 Extreme obesity (Class III) Increased incidence by 254%   Patient's current BMI Ideal Body weight  Body mass index is 27.02 kg/m. Ideal body weight: 47.8 kg (105 lb 6.1 oz) Adjusted ideal body weight: 54.6 kg (120 lb 6.8 oz)   BMI Readings from Last 4 Encounters:  06/24/21 27.02 kg/m  05/22/21 27.02 kg/m  11/02/19 25.35 kg/m  09/24/19 25.61 kg/m   Wt Readings from Last 4 Encounters:  06/24/21 143 lb (64.9 kg)  05/22/21 143 lb (64.9 kg)  11/02/19 138 lb 9.6 oz (62.9 kg)  09/24/19 140 lb (63.5 kg)    Psych/Mental status: Alert, oriented x 3 (person, place, & time)       Eyes: PERLA Respiratory: No evidence of acute respiratory distress  Assessment & Plan  Primary Diagnosis & Pertinent Problem List: The primary encounter diagnosis was Chronic pain syndrome. 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) Anterolisthesis of cervical spine (C3/C4), Tendinopathy of rotator cuff (Right), Supraspinatus syndrome (Right), Subacromial bursitis of shoulder joint (Right), Subdeltoid bursitis (Right), Osteoarthritis of AC (acromioclavicular) joint (Right), Tendinopathy of rotator cuff (Left), Supraspinatus syndrome (Left), Osteoarthritis of AC (acromioclavicular) joint (Left), Subacromial bursitis of shoulder joint (Left), Subdeltoid bursitis (Left), Lumbar facet arthropathy, Lumbar facet syndrome, Pseudoarthrosis of lumbar spine (Right: L5-S1), Bertolotti's syndrome  (Right: L5-S1), Cervicalgia, 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, C5-6, C6-7), Cervical facet arthropathy (C3-4, C4-5, C5-6), Cervical facet syndrome (Bilateral), 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, C5-6) (  Left: C3-4), and Cervical Retrolisthesis (1.5 mm) of C4/C5 were also pertinent to this visit.  Visit Diagnosis: 1. Chronic pain syndrome   2. Chronic shoulder pain (1ry area of Pain) (Bilateral) (R>L)   3. Chronic vaginal pain (2ry area of Pain)   4. Chronic low back pain (3ry area of Pain) (Bilateral) (L>R) w/o sciatica   5. Chronic abdominal pain (4th area of Pain)   6. Chronic neck pain (5th area of Pain) (Bilateral) (R>L)   7. Chronic occipital headache (6th area of Pain) (Bilateral) (R>L)   8. Chronic upper extremity pain (7th area of Pain) (Bilateral) (R>L)   9. Abnormal drug screen (05/22/2021)   10. Grade 1 (67m) Anterolisthesis of cervical spine (C3/C4)   11. Tendinopathy of rotator cuff (Right)   12. Supraspinatus syndrome (Right)   13. Subacromial bursitis of shoulder joint (Right)   14. Subdeltoid bursitis (Right)   15. Osteoarthritis of AC (acromioclavicular) joint (Right)   16. Tendinopathy of rotator cuff (Left)   17. Supraspinatus syndrome (Left)   18. Osteoarthritis of AC (acromioclavicular) joint (Left)   19. Subacromial bursitis of shoulder joint (Left)   20. Subdeltoid bursitis (Left)   21. Lumbar facet arthropathy   22. Lumbar facet syndrome   23. Pseudoarthrosis of lumbar spine (Right: L5-S1)   24. Bertolotti's syndrome (Right: L5-S1)   25. Cervicalgia   26. 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, C5-6, C6-7)   27. Cervical facet arthropathy (C3-4, C4-5, C5-6)   28. Cervical facet syndrome (Bilateral)   29. 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, C5-6) (Left: C3-4)   30. Cervical Retrolisthesis (1.5 mm) of C4/C5    Problems updated and reviewed during this visit: Problem  Grade 1 (33m Anterolisthesis of cervical spine (C3/C4)   Tendinopathy of rotator cuff (Right)  Supraspinatus syndrome (Right)  Subacromial bursitis of shoulder joint (Right)  Subdeltoid bursitis (Right)  Osteoarthritis of AC (acromioclavicular) joint (Right)  Tendinopathy of rotator cuff (Left)  Supraspinatus syndrome (Left)  Osteoarthritis of AC (acromioclavicular) joint (Left)  Subacromial bursitis of shoulder joint (Left)  Subdeltoid bursitis (Left)  Lumbar Facet Arthropathy  Lumbar facet syndrome  Pseudoarthrosis of lumbar spine (Right: L5-S1)  Bertolotti's syndrome (Right: L5-S1)    Plan of Care  Pharmacotherapy (Medications Ordered): No orders of the defined types were placed in this encounter.  Procedure Orders         Cervical Epidural Injection     Lab Orders  No laboratory test(s) ordered today   Imaging Orders  No imaging studies ordered today   Referral Orders  No referral(s) requested today    Pharmacological management options:  Opioid Analgesics: I will not be prescribing any opioids at this time Membrane stabilizer: I will not be prescribing any at this time Muscle relaxant: I will not be prescribing any at this time NSAID: I will not be prescribing any at this time Other analgesic(s): I will not be prescribing any at this time      Interventional Therapies  Risk   Complexity Considerations:   Estimated body mass index is 27.02 kg/m as calculated from the following:   Height as of this encounter: 5' 1"  (1.549 m).   Weight as of this encounter: 143 lb (64.9 kg). WNL   Planned   Pending:   Diagnostic right cervical ESI #1    Under consideration:   Diagnostic bilateral suprascapular NB #1  Possible bilateral suprascapular nerve RFA  Diagnostic bilateral AC joint inj. #1  Diagnostic bilateral glenohumeral joint inj. #1  Diagnostic  bilateral subacromial/subdeltoid bursa inj. #1  Diagnostic bilateral cervical facet MBB #1    Completed:   None at this time   Therapeutic   Palliative (PRN) options:    None established    Provider-requested follow-up: Return for [facility]) procedure: (R) CESI #1. Recent Visits Date Type Provider Dept  05/22/21 Office Visit Milinda Pointer, MD [facility]  Showing recent visits within past 90 days and meeting all other requirements Today's Visits Date Type Provider Dept  06/24/21 Office Visit Milinda Pointer, MD [facility]  Showing today's visits and meeting all other requirements Future Appointments No visits were found meeting these conditions. Showing future appointments within next 90 days and meeting all other requirements  Primary Care Physician: Sharyne Peach, MD Note by: Gaspar Cola, MD Date: 06/24/2021; Time: 11:54 AM

## 2021-06-24 ENCOUNTER — Ambulatory Visit: Payer: Medicare Other | Attending: Pain Medicine | Admitting: Pain Medicine

## 2021-06-24 ENCOUNTER — Other Ambulatory Visit: Payer: Self-pay

## 2021-06-24 ENCOUNTER — Encounter: Payer: Self-pay | Admitting: Pain Medicine

## 2021-06-24 VITALS — BP 165/75 | HR 97 | Temp 97.3°F | Resp 16 | Ht 61.0 in | Wt 143.0 lb

## 2021-06-24 DIAGNOSIS — M4310 Spondylolisthesis, site unspecified: Secondary | ICD-10-CM

## 2021-06-24 DIAGNOSIS — Q7649 Other congenital malformations of spine, not associated with scoliosis: Secondary | ICD-10-CM | POA: Diagnosis present

## 2021-06-24 DIAGNOSIS — M47812 Spondylosis without myelopathy or radiculopathy, cervical region: Secondary | ICD-10-CM | POA: Diagnosis present

## 2021-06-24 DIAGNOSIS — R102 Pelvic and perineal pain unspecified side: Secondary | ICD-10-CM

## 2021-06-24 DIAGNOSIS — M25511 Pain in right shoulder: Secondary | ICD-10-CM | POA: Insufficient documentation

## 2021-06-24 DIAGNOSIS — R892 Abnormal level of other drugs, medicaments and biological substances in specimens from other organs, systems and tissues: Secondary | ICD-10-CM

## 2021-06-24 DIAGNOSIS — R109 Unspecified abdominal pain: Secondary | ICD-10-CM | POA: Diagnosis present

## 2021-06-24 DIAGNOSIS — M67912 Unspecified disorder of synovium and tendon, left shoulder: Secondary | ICD-10-CM

## 2021-06-24 DIAGNOSIS — M545 Low back pain, unspecified: Secondary | ICD-10-CM | POA: Insufficient documentation

## 2021-06-24 DIAGNOSIS — M19011 Primary osteoarthritis, right shoulder: Secondary | ICD-10-CM | POA: Diagnosis present

## 2021-06-24 DIAGNOSIS — G8929 Other chronic pain: Secondary | ICD-10-CM | POA: Diagnosis present

## 2021-06-24 DIAGNOSIS — M542 Cervicalgia: Secondary | ICD-10-CM

## 2021-06-24 DIAGNOSIS — M25512 Pain in left shoulder: Secondary | ICD-10-CM | POA: Diagnosis present

## 2021-06-24 DIAGNOSIS — M19012 Primary osteoarthritis, left shoulder: Secondary | ICD-10-CM

## 2021-06-24 DIAGNOSIS — M79601 Pain in right arm: Secondary | ICD-10-CM | POA: Diagnosis present

## 2021-06-24 DIAGNOSIS — S32009K Unspecified fracture of unspecified lumbar vertebra, subsequent encounter for fracture with nonunion: Secondary | ICD-10-CM | POA: Diagnosis present

## 2021-06-24 DIAGNOSIS — M7552 Bursitis of left shoulder: Secondary | ICD-10-CM

## 2021-06-24 DIAGNOSIS — M7551 Bursitis of right shoulder: Secondary | ICD-10-CM

## 2021-06-24 DIAGNOSIS — M67911 Unspecified disorder of synovium and tendon, right shoulder: Secondary | ICD-10-CM | POA: Diagnosis present

## 2021-06-24 DIAGNOSIS — R519 Headache, unspecified: Secondary | ICD-10-CM

## 2021-06-24 DIAGNOSIS — M75101 Unspecified rotator cuff tear or rupture of right shoulder, not specified as traumatic: Secondary | ICD-10-CM | POA: Diagnosis present

## 2021-06-24 DIAGNOSIS — G894 Chronic pain syndrome: Secondary | ICD-10-CM | POA: Diagnosis not present

## 2021-06-24 DIAGNOSIS — M75102 Unspecified rotator cuff tear or rupture of left shoulder, not specified as traumatic: Secondary | ICD-10-CM | POA: Diagnosis present

## 2021-06-24 DIAGNOSIS — M47816 Spondylosis without myelopathy or radiculopathy, lumbar region: Secondary | ICD-10-CM | POA: Diagnosis present

## 2021-06-24 DIAGNOSIS — M4312 Spondylolisthesis, cervical region: Secondary | ICD-10-CM

## 2021-06-24 DIAGNOSIS — M79602 Pain in left arm: Secondary | ICD-10-CM | POA: Insufficient documentation

## 2021-06-24 DIAGNOSIS — M4802 Spinal stenosis, cervical region: Secondary | ICD-10-CM | POA: Diagnosis present

## 2021-06-24 NOTE — Progress Notes (Signed)
Safety precautions to be maintained throughout the outpatient stay will include: orient to surroundings, keep bed in low position, maintain call bell within reach at all times, provide assistance with transfer out of bed and ambulation.  

## 2021-06-24 NOTE — Patient Instructions (Addendum)
______________________________________________________________________  Preparing for Procedure with Sedation  NOTICE: Due to recent regulatory changes, starting on November 19, 2020, procedures requiring intravenous (IV) sedation will no longer be performed at the [facility].  These types of procedures are required to be performed at [facility] ambulatory surgery facility.  We are very sorry for the inconvenience.  Procedure appointments are limited to planned procedures: No Prescription Refills. No disability issues will be discussed. No medication changes will be discussed.  Instructions: Oral Intake: Do not eat or drink anything for at least 8 hours prior to your procedure. (Exception: Blood Pressure Medication. See below.) Transportation: A driver is required. You may not drive yourself after the procedure. Blood Pressure Medicine: Do not forget to take your blood pressure medicine with a sip of water the morning of the procedure. If your Diastolic (lower reading) is above 100 mmHg, elective cases will be cancelled/rescheduled. Blood thinners: These will need to be stopped for procedures. Notify our staff if you are taking any blood thinners. Depending on which one you take, there will be specific instructions on how and when to stop it. Diabetics on insulin: Notify the staff so that you can be scheduled 1st case in the morning. If your diabetes requires high dose insulin, take only  of your normal insulin dose the morning of the procedure and notify the staff that you have done so. Preventing infections: Shower with an antibacterial soap the morning of your procedure. Build-up your immune system: Take 1000 mg of Vitamin C with every meal (3 times a day) the day prior to your procedure. Antibiotics: Inform the staff if you have a condition or reason that requires you to take antibiotics before dental procedures. Pregnancy: If you are pregnant, call and cancel the procedure. Sickness: If  you have a cold, fever, or any active infections, call and cancel the procedure. Arrival: You must be in the facility at least 30 minutes prior to your scheduled procedure. Children: Do not bring children with you. Dress appropriately: Bring dark clothing that you would not mind if they get stained. Valuables: Do not bring any jewelry or valuables.  Reasons to call and reschedule or cancel your procedure: (Following these recommendations will minimize the risk of a serious complication.) Surgeries: Avoid having procedures within 2 weeks of any surgery. (Avoid for 2 weeks before or after any surgery). Flu Shots: Avoid having procedures within 2 weeks of a flu shots. (Avoid for 2 weeks before or after immunizations). Barium: Avoid having a procedure within 7-10 days after having had a radiological study involving the use of radiological contrast. (Myelograms, Barium swallow or enema study). Heart attacks: Avoid any elective procedures or surgeries for the initial 6 months after a "Myocardial Infarction" (Heart Attack). Blood thinners: It is imperative that you stop these medications before procedures. Let us know if you if you take any blood thinner.  Infection: Avoid procedures during or within two weeks of an infection (including chest colds or gastrointestinal problems). Symptoms associated with infections include: Localized redness, fever, chills, night sweats or profuse sweating, burning sensation when voiding, cough, congestion, stuffiness, runny nose, sore throat, diarrhea, nausea, vomiting, cold or Flu symptoms, recent or current infections. It is specially important if the infection is over the area that we intend to treat. Heart and lung problems: Symptoms that may suggest an active cardiopulmonary problem include: cough, chest pain, breathing difficulties or shortness of breath, dizziness, ankle swelling, uncontrolled high or unusually low blood pressure, and/or palpitations. If you are  experiencing any of these symptoms, cancel your procedure and contact your primary care physician for an evaluation.  Remember:  Regular Business hours are:  Monday to Thursday 8:00 AM to 4:00 PM  Provider's Schedule: Milinda Pointer, MD:  Procedure days: Tuesday and Thursday 7:30 AM to 4:00 PM  Gillis Santa, MD:  Procedure days: Monday and Wednesday 7:30 AM to 4:00 PM ______________________________________________________________________  ____________________________________________________________________________________________  General Risks and Possible Complications  Patient Responsibilities: It is important that you read this as it is part of your informed consent. It is our duty to inform you of the risks and possible complications associated with treatments offered to you. It is your responsibility as a patient to read this and to ask questions about anything that is not clear or that you believe was not covered in this document.  Patients Rights: You have the right to refuse treatment. You also have the right to change your mind, even after initially having agreed to have the treatment done. However, under this last option, if you wait until the last second to change your mind, you may be charged for the materials used up to that point.  Introduction: Medicine is not an Chief Strategy Officer. Everything in Medicine, including the lack of treatment(s), carries the potential for danger, harm, or loss (which is by definition: Risk). In Medicine, a complication is a secondary problem, condition, or disease that can aggravate an already existing one. All treatments carry the risk of possible complications. The fact that a side effects or complications occurs, does not imply that the treatment was conducted incorrectly. It must be clearly understood that these can happen even when everything is done following the highest safety standards.  No treatment: You can choose not to proceed with the  proposed treatment alternative. The PRO(s) would include: avoiding the risk of complications associated with the therapy. The CON(s) would include: not getting any of the treatment benefits. These benefits fall under one of three categories: diagnostic; therapeutic; and/or palliative. Diagnostic benefits include: getting information which can ultimately lead to improvement of the disease or symptom(s). Therapeutic benefits are those associated with the successful treatment of the disease. Finally, palliative benefits are those related to the decrease of the primary symptoms, without necessarily curing the condition (example: decreasing the pain from a flare-up of a chronic condition, such as incurable terminal cancer).  General Risks and Complications: These are associated to most interventional treatments. They can occur alone, or in combination. They fall under one of the following six (6) categories: no benefit or worsening of symptoms; bleeding; infection; nerve damage; allergic reactions; and/or death. No benefits or worsening of symptoms: In Medicine there are no guarantees, only probabilities. No healthcare provider can ever guarantee that a medical treatment will work, they can only state the probability that it may. Furthermore, there is always the possibility that the condition may worsen, either directly, or indirectly, as a consequence of the treatment. Bleeding: This is more common if the patient is taking a blood thinner, either prescription or over the counter (example: Goody Powders, Fish oil, Aspirin, Garlic, etc.), or if suffering a condition associated with impaired coagulation (example: Hemophilia, cirrhosis of the liver, low platelet counts, etc.). However, even if you do not have one on these, it can still happen. If you have any of these conditions, or take one of these drugs, make sure to notify your treating physician. Infection: This is more common in patients with a compromised  immune system, either due to disease (example:  diabetes, cancer, human immunodeficiency virus [HIV], etc.), or due to medications or treatments (example: therapies used to treat cancer and rheumatological diseases). However, even if you do not have one on these, it can still happen. If you have any of these conditions, or take one of these drugs, make sure to notify your treating physician. Nerve Damage: This is more common when the treatment is an invasive one, but it can also happen with the use of medications, such as those used in the treatment of cancer. The damage can occur to small secondary nerves, or to large primary ones, such as those in the spinal cord and brain. This damage may be temporary or permanent and it may lead to impairments that can range from temporary numbness to permanent paralysis and/or brain death. Allergic Reactions: Any time a substance or material comes in contact with our body, there is the possibility of an allergic reaction. These can range from a mild skin rash (contact dermatitis) to a severe systemic reaction (anaphylactic reaction), which can result in death. Death: In general, any medical intervention can result in death, most of the time due to an unforeseen complication. ____________________________________________________________________________________________ ______________________________________________________________________  Preparing for your procedure (without sedation)  Procedure appointments are limited to planned procedures: No Prescription Refills. No disability issues will be discussed. No medication changes will be discussed.  Instructions: Oral Intake: Do not eat or drink anything for at least 6 hours prior to your procedure. (Exception: Blood Pressure Medication. See below.) Transportation: Unless otherwise stated by your physician, you may drive yourself after the procedure. Blood Pressure Medicine: Do not forget to take your blood pressure  medicine with a sip of water the morning of the procedure. If your Diastolic (lower reading)is above 100 mmHg, elective cases will be cancelled/rescheduled. Blood thinners: These will need to be stopped for procedures. Notify our staff if you are taking any blood thinners. Depending on which one you take, there will be specific instructions on how and when to stop it. Diabetics on insulin: Notify the staff so that you can be scheduled 1st case in the morning. If your diabetes requires high dose insulin, take only  of your normal insulin dose the morning of the procedure and notify the staff that you have done so. Preventing infections: Shower with an antibacterial soap the morning of your procedure.  Build-up your immune system: Take 1000 mg of Vitamin C with every meal (3 times a day) the day prior to your procedure. Antibiotics: Inform the staff if you have a condition or reason that requires you to take antibiotics before dental procedures. Pregnancy: If you are pregnant, call and cancel the procedure. Sickness: If you have a cold, fever, or any active infections, call and cancel the procedure. Arrival: You must be in the facility at least 30 minutes prior to your scheduled procedure. Children: Do not bring any children with you. Dress appropriately: Bring dark clothing that you would not mind if they get stained. Valuables: Do not bring any jewelry or valuables.  Reasons to call and reschedule or cancel your procedure: (Following these recommendations will minimize the risk of a serious complication.) Surgeries: Avoid having procedures within 2 weeks of any surgery. (Avoid for 2 weeks before or after any surgery). Flu Shots: Avoid having procedures within 2 weeks of a flu shots or . (Avoid for 2 weeks before or after immunizations). Barium: Avoid having a procedure within 7-10 days after having had a radiological study involving the use of radiological contrast. (Myelograms,  Barium swallow or  enema study). Heart attacks: Avoid any elective procedures or surgeries for the initial 6 months after a "Myocardial Infarction" (Heart Attack). Blood thinners: It is imperative that you stop these medications before procedures. Let us know if you if you take any blood thinner.  Infection: Avoid procedures during or within two weeks of an infection (including chest colds or gastrointestinal problems). Symptoms associated with infections include: Localized redness, fever, chills, night sweats or profuse sweating, burning sensation when voiding, cough, congestion, stuffiness, runny nose, sore throat, diarrhea, nausea, vomiting, cold or Flu symptoms, recent or current infections. It is specially important if the infection is over the area that we intend to treat. Heart and lung problems: Symptoms that may suggest an active cardiopulmonary problem include: cough, chest pain, breathing difficulties or shortness of breath, dizziness, ankle swelling, uncontrolled high or unusually low blood pressure, and/or palpitations. If you are experiencing any of these symptoms, cancel your procedure and contact your primary care physician for an evaluation.  Remember:  Regular Business hours are:  Monday to Thursday 8:00 AM to 4:00 PM  Provider's Schedule: Milinda Pointer, MD:  Procedure days: Tuesday and Thursday 7:30 AM to 4:00 PM  Gillis Santa, MD:  Procedure days: Monday and Wednesday 7:30 AM to 4:00 PM ______________________________________________________________________

## 2021-06-26 DIAGNOSIS — M4722 Other spondylosis with radiculopathy, cervical region: Secondary | ICD-10-CM | POA: Insufficient documentation

## 2021-06-26 NOTE — Progress Notes (Signed)
PROVIDER NOTE: Interpretation of information contained herein should be left to medically-trained personnel. Specific patient instructions are provided elsewhere under "Patient Instructions" section of medical record. This document was created in part using STT-dictation technology, any transcriptional errors that may result from this process are unintentional.  Patient: Caroline Hughes Type: Established DOB: 31-Dec-1960 MRN: [id number] PCP: Sharyne Peach, MD  Service: Procedure DOS: 06/27/2021 Setting: Ambulatory Location: Ambulatory outpatient facility Delivery: Face-to-face Provider: Gaspar Cola, MD Specialty: Interventional Pain Management Specialty designation: 09 Location: Outpatient facility Ref. Prov.: Sharyne Peach, MD   Procedure [facility] Interventional Pain Management )   Type: Cervical Epidural Steroid injection (ESI) (Interlaminar) #1  Laterality: Right  Level: C7-T1 Imaging: Fluoroscopy-assisted DOS: 06/27/2021  Performed by: Milinda Pointer, MD Anesthesia: Local anesthesia (1-2% Lidocaine) Anxiolysis: None                 Sedation: None.   Purpose: Diagnostic/Therapeutic Indications: Cervicalgia, cervical radicular pain, degenerative disc disease, severe enough to impact quality of life or function. 1. Cervical spondylosis with radiculopathy   2. Chronic upper extremity pain (7th area of Pain) (Bilateral) (R>L)   3. Chronic neck pain (5th area of Pain) (Bilateral) (R>L)   4. 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, C5-6) (Left: C3-4)   6. Cervical facet arthropathy (C3-4, C4-5, C5-6)   7. Cervical Retrolisthesis (1.5 mm) of C4/C5   8. Grade 1 (51m) Anterolisthesis of cervical spine (C3/C4)   9. DDD (degenerative disc disease), cervical   10. Cervical myofascial pain syndrome    Latex precautions, history of latex allergy    At high risk for allergic reaction to latex    History of allergy to  latex    NAS-11 score:   Pre-procedure: 7 /10   Post-procedure: 0-No pain/10     Pre-Procedure Preparation  Monitoring: As per clinic protocol. Respiration, ETCO2, SpO2, BP, heart rate and rhythm monitor placed and checked for adequate function  Risk Assessment: Vitals:  BDZH:GDJMEQASTbody mass index is 27.02 kg/m as calculated from the following:   Height as of this encounter: 5' 1"  (1.549 m).   Weight as of this encounter: 143 lb (64.9 kg)., Rate:(!) 114ECG Heart Rate: (!) 114, BP:(!) 158/94, Resp:18, Temp:(!) 97.5 F (36.4 C), SpO2:96 %  Allergies: She is allergic to naproxen, penicillins, gluten meal, lactose intolerance (gi), latex, soy allergy, sulfonylureas, doxycycline, humira [adalimumab], other, simvastatin, statins, sulfa antibiotics, sulfasalazine, and vancomycin.  Precautions: None required  Blood-thinner(s): None at this time  Coagulopathies: Reviewed. None identified.   Active Infection(s): Reviewed. None identified. Caroline Hughes afebrile   Location setting: Procedure suite Position: Prone, on modified reverse trendelenburg to facilitate breathing, with head in head-cradle. Pillows positioned under chest (below chin-level) with cervical spine flexed. Safety Precautions: Patient was assessed for positional comfort and pressure points before starting the procedure. Prepping solution: DuraPrep (Iodine Povacrylex [0.7% available iodine] and Isopropyl Alcohol, 74% w/w) Prep Area: Entire  cervicothoracic region Approach: percutaneous, paramedial Intended target: Posterior cervical epidural space Materials: Tray: Epidural Needle(s): Epidural (Tuohy) Qty: 1 Length: (955m 3.5-inch Gauge: 17G   Meds ordered this encounter  Medications   iohexol (OMNIPAQUE) 180 MG/ML injection 10 mL    Must be Myelogram-compatible. If not available, you may substitute with a water-soluble, non-ionic, hypoallergenic, myelogram-compatible radiological contrast medium.   lidocaine  (XYLOCAINE) 2 % (with pres) injection 400 mg   pentafluoroprop-tetrafluoroeth (GEBAUERS) aerosol   DISCONTD: lactated ringers infusion 1,000 mL  DISCONTD: midazolam (VERSED) 5 MG/5ML injection 0.5-2 mg    Make sure Flumazenil is available in the pyxis when using this medication. If oversedation occurs, administer 0.2 mg IV over 15 sec. If after 45 sec no response, administer 0.2 mg again over 1 min; may repeat at 1 min intervals; not to exceed 4 doses (1 mg)   sodium chloride flush (NS) 0.9 % injection 1 mL   ropivacaine (PF) 2 mg/mL (0.2%) (NAROPIN) injection 1 mL   dexamethasone (DECADRON) injection 10 mg    Orders Placed This Encounter  Procedures   Cervical Epidural Injection    Indication(s): Radiculitis and cervicalgia associated with cervical degenerative disc disease. Position: Prone Imaging guidance: Fluoroscopy required. Contrast required unless contraindicated by allergy or severe CKD. Equipment & Materials: Epidural tray & needle.    Scheduling Instructions:     Procedure: Cervical Epidural Steroid Injection/Block     Planned Level(s): C7-T1     Laterality: Right-sided     Anxiolysis: Patient's choice.     Timeframe: Today    Order Specific Question:   Where will this procedure be performed?    Answer:   [facility]    Comments:   by Dr. Fortunato Curling [facility] C-ARM 1-60 MIN NO REPORT    Intraoperative interpretation by procedural physician at [place].    Standing Status:   Standing    Number of Occurrences:   1    Order Specific Question:   Reason for exam:    Answer:   Assistance in needle guidance and placement for procedures requiring needle placement in or near specific anatomical locations not easily accessible without such assistance.   Informed Consent Details: Physician/Practitioner Attestation; Transcribe to consent form and obtain patient signature    Nursing instructions: Transcribe to consent form and obtain patient signature. Always  confirm laterality of pain with Ms. Stephani, before procedure.    Order Specific Question:   Physician/Practitioner attestation of informed consent for procedure/surgical case    Answer:   I, the physician/practitioner, attest that I have discussed with the patient the benefits, risks, side effects, alternatives, likelihood of achieving goals and potential problems during recovery for the procedure that I have provided informed consent.    Order Specific Question:   Procedure    Answer:   Cervical Epidural Steroid Injection (CESI) under fluoroscopic guidance    Order Specific Question:   Physician/Practitioner performing the procedure    Answer:   Kinlee Garrison A. Dossie Arbour MD    Order Specific Question:   Indication/Reason    Answer:   Indications: Cervicalgia (neck pain), cervical radicular pain, radiculitis (arm/shoulder pain, numbness, and/or weakness), degenerative disc disease, severe enough to greatly impact quality of life or function.   Provide equipment / supplies at bedside    "Epidural Tray" (Disposable   single use) Catheter: NOT required    Standing Status:   Standing    Number of Occurrences:   1    Order Specific Question:   Specify    Answer:   Epidural Tray   Latex precautions    Activate Latex-Free Protocol.    Standing Status:   Standing    Number of Occurrences:   1     Time-out: 0848 I initiated and conducted the "Time-out" before starting the procedure, as per protocol. The patient was asked to participate by confirming the accuracy of the "Time Out" information. Verification of the correct person, site, and procedure were performed and confirmed by me,  the nursing staff, and the patient. "Time-out" conducted as per Joint Commission's Universal Protocol (UP.01.01.01). Procedure checklist: Completed   H&P (Pre-op  Assessment)  Caroline Hughes is a 61 y.o. (year old), female patient, seen today for interventional treatment. She  has a past surgical history that includes  Abdominal hysterectomy; rectal fistula packing; Ileostomy (Bilateral); Hernia repair; Cholecystectomy; Cataract extraction w/PHACO (Right, 11/25/2016); Colon resection; Cataract extraction w/PHACO (Left, 12/02/2016); Esophagogastroduodenoscopy (egd) with propofol (N/A, 06/17/2017); Ileoscopy (N/A, 06/17/2017); Esophagogastroduodenoscopy (egd) with propofol (N/A, 06/19/2017); Ileoscopy (N/A, 06/23/2017); and Esophagogastroduodenoscopy (egd) with propofol (N/A, 01/26/2019). Caroline Hughes has a current medication list which includes the following prescription(s): advair hfa, ajovy, albuterol, albuterol, alprazolam, baclofen, cimzia prefilled, cimzia prefilled, clindamycin, desipramine, dicyclomine, escitalopram, famotidine, fexofenadine, advair hfa, folic acid, ajovy, precision qid test, hydroxyzine, insulin detemir, ultiguard safepack pen needle, levofloxacin, lorazepam, methotrexate, montelukast, nortriptyline, ondansetron, pantoprazole, semaglutide(0.25 or 0.83m/dos), sucralfate, sumatriptan, tizanidine, tramadol, and ulticare micro pen needles, and the following Facility-Administered Medications: pentafluoroprop-tetrafluoroeth. Her primarily concern today is the Neck Pain  She is allergic to naproxen, penicillins, gluten meal, lactose intolerance (gi), latex, soy allergy, sulfonylureas, doxycycline, humira [adalimumab], other, simvastatin, statins, sulfa antibiotics, sulfasalazine, and vancomycin.   Last encounter: My last encounter with her was on 06/24/2021. Pertinent problems: Caroline Hughes Cervical myofascial pain syndrome; Controlled type 2 diabetes mellitus with diabetic nephropathy, with long-term current use of insulin (HZuehl; Fibromyalgia; DDD (degenerative disc disease), cervical; Spondylosis of cervical region without myelopathy or radiculopathy; Chronic pain syndrome; Osteoarthritis; Peripheral polyneuropathy; Piriformis syndrome, right; SI (sacroiliac) joint dysfunction; Trigger middle finger of left hand;  Acute non intractable tension-type headache; Arthritis of both hands; Chronic shoulder pain (1ry area of Pain) (Bilateral) (R>L); Right hand pain; Chronic low back pain (3ry area of Pain) (Bilateral) (L>R) w/o sciatica; Chronic neck pain (5th area of Pain) (Bilateral) (R>L); 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, C5-6) (Left: C3-4); 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, C5-6, C6-7); Cervical Retrolisthesis (1.5 mm) of C4/C5; Cervical facet arthropathy (C3-4, C4-5, C5-6); Abnormal MRI, shoulder (Bilateral) (07/25/2020); Abnormal EMG (electromyogram) (03/18/2021; 03/25/2021); Chronic vaginal pain (2ry area of Pain); Chronic anal or rectal pain (2ry area of Pain); Chronic abdominal pain (4th area of Pain); Chronic occipital headache (6th area of Pain) (Bilateral) (R>L); Cervical facet syndrome (Bilateral); Chronic upper extremity pain (7th area of Pain) (Bilateral) (R>L); Grade 1 (373m Anterolisthesis of cervical spine (C3/C4); Tendinopathy of rotator cuff (Right); Supraspinatus syndrome (Right); Subacromial bursitis of shoulder joint (Right); Subdeltoid bursitis (Right); Osteoarthritis of AC (acromioclavicular) joint (Right); Tendinopathy of rotator cuff (Left); Supraspinatus syndrome (Left); Osteoarthritis of AC (acromioclavicular) joint (Left); Subacromial bursitis of shoulder joint (Left); Subdeltoid bursitis (Left); Lumbar facet arthropathy; Lumbar facet syndrome; Pseudoarthrosis of lumbar spine (Right: L5-S1); Bertolotti's syndrome (Right: L5-S1); and Cervical spondylosis with radiculopathy on their pertinent problem list. Pain Assessment: Severity of Chronic pain is reported as a 7 /10. Location: Neck  /shoulders and arms to elbow. Onset: More than a month ago. Quality: ShHervey ArdShooting. Timing: Constant. Modifying factor(s): heat, ice. Vitals:  height is 5' 1"  (1.549 m) and weight is 143 lb (64.9 kg). Her temporal temperature is 97.5 F (36.4 C) (abnormal). Her blood pressure is 187/100 (abnormal) and  her pulse is 114 (abnormal). Her respiration is 16 and oxygen saturation is 97%.   Reason for encounter: Interventional pain management therapy due pain of at least four (4) weeks in duration, with to failure to respond to and/or inability to tolerate more conservative care.   Site Confirmation: Caroline Hughes asked to confirm the  procedure and laterality before marking the site.  Consent: Before the procedure and under the influence of no sedative(s), amnesic(s), or anxiolytics, the patient was informed of the treatment options, risks and possible complications. To fulfill our ethical and legal obligations, as recommended by the American Medical Association's Code of Ethics, I have informed the patient of my clinical impression; the nature and purpose of the treatment or procedure; the risks, benefits, and possible complications of the intervention; the alternatives, including doing nothing; the risk(s) and benefit(s) of the alternative treatment(s) or procedure(s); and the risk(s) and benefit(s) of doing nothing. The patient was provided information about the general risks and possible complications associated with the procedure. These may include, but are not limited to: failure to achieve desired goals, infection, bleeding, organ or nerve damage, allergic reactions, paralysis, and death. In addition, the patient was informed of those risks and complications associated to Spine-related procedures, such as failure to decrease pain; infection (i.e.: Meningitis, epidural or intraspinal abscess); bleeding (i.e.: epidural hematoma, subarachnoid hemorrhage, or any other type of intraspinal or peri-dural bleeding); organ or nerve damage (i.e.: Any type of peripheral nerve, nerve root, or spinal cord injury) with subsequent damage to sensory, motor, and/or autonomic systems, resulting in permanent pain, numbness, and/or weakness of one or several areas of the body; allergic reactions; (i.e.: anaphylactic  reaction); and/or death. Furthermore, the patient was informed of those risks and complications associated with the medications. These include, but are not limited to: allergic reactions (i.e.: anaphylactic or anaphylactoid reaction(s)); adrenal axis suppression; blood sugar elevation that in diabetics may result in ketoacidosis or comma; water retention that in patients with history of congestive heart failure may result in shortness of breath, pulmonary edema, and decompensation with resultant heart failure; weight gain; swelling or edema; medication-induced neural toxicity; particulate matter embolism and blood vessel occlusion with resultant organ, and/or nervous system infarction; and/or aseptic necrosis of one or more joints. Finally, the patient was informed that Medicine is not an exact science; therefore, there is also the possibility of unforeseen or unpredictable risks and/or possible complications that may result in a catastrophic outcome. The patient indicated having understood very clearly. We have given the patient no guarantees and we have made no promises. Enough time was given to the patient to ask questions, all of which were answered to the patient's satisfaction. Caroline Hughes has indicated that she wanted to continue with the procedure. Attestation: I, the ordering provider, attest that I have discussed with the patient the benefits, risks, side-effects, alternatives, likelihood of achieving goals, and potential problems during recovery for the procedure that I have provided informed consent.  Date   Time: 06/27/2021  8:22 AM   Prophylactic antibiotics  Anti-infectives (From admission, onward)    None      Indication(s): None identified   Description of procedure   Start Time: 0848 hrs  Local Anesthesia: Once the patient was positioned, prepped, and time-out was completed. The target area was identified located. The skin was marked with an approved surgical skin marker. Once  marked, the skin (epidermis, dermis, and hypodermis), and deeper tissues (fat, connective tissue and muscle) were infiltrated with a small amount of a short-acting local anesthetic, loaded on a 10cc syringe with a 25G, 1.5-in  Needle. An appropriate amount of time was allowed for local anesthetics to take effect before proceeding to the next step. Local Anesthetic: Lidocaine 1-2% The unused portion of the local anesthetic was discarded in the proper designated containers. Safety Precautions: Aspiration looking  for blood return was conducted prior to all injections. At no point did I inject any substances, as a needle was being advanced. Before injecting, the patient was told to immediately notify me if she was experiencing any new onset of "ringing in the ears, or metallic taste in the mouth". No attempts were made at seeking any paresthesias. Safe injection practices and needle disposal techniques used. Medications properly checked for expiration dates. SDV (single dose vial) medications used. After the completion of the procedure, all disposable equipment used was discarded in the proper designated medical waste containers.  Technical description: Protocol guidelines were followed. Using fluoroscopic guidance, the epidural needle was introduced through the skin, ipsilateral to the reported pain, and advanced to the target area. Posterior laminar os was contacted and the needle walked caudad, until the lamina was cleared. The ligamentum flavum was engaged and the epidural space identified using loss-of-resistance technique with 2-3 ml of PF-NaCl (0.9% NSS), in a 5cc dedicated LOR syringe. See "Imaging guidance" below for use of contrast details.  Injection: Once satisfactory needle placement was confirmed, I proceeded to inject the desired solution in slow, incremental fashion, intermittently assessing for discomfort or any signs of abnormal or undesired spread of substance. Once completed, the needle was  removed and disposed of, as per hospital protocols.   Vitals:   06/27/21 0845 06/27/21 0849 06/27/21 0852 06/27/21 0903  BP: (!) 180/104 (!) 179/108 (!) 188/104 (!) 187/100  Pulse:      Resp: 13 13 17 16   Temp:      TempSrc:      SpO2: 96% 96% 97% 97%  Weight:      Height:        End Time: 0855 hrs  Once the entire procedure was completed, the treated area was cleaned, making sure to leave some of the prepping solution back to take advantage of its long term bactericidal properties.   Imaging guidance  Type of Imaging Technique: Fluoroscopy Guidance (Spinal) Indication(s): Assistance in needle guidance and placement for procedures requiring needle placement in or near specific anatomical locations not easily accessible without such assistance. Exposure Time: Please see nurses notes for exact fluoroscopy time. Contrast: Before injecting any contrast, we confirmed that the patient did not have an allergy to iodine, shellfish, or radiological contrast. Once satisfactory needle placement was completed, radiological contrast was injected under continuous fluoroscopic guidance. Injection of contrast accomplished without complications. See chart for type and volume of contrast used. Fluoroscopic Guidance: I was personally present in the fluoroscopy suite, where the patient was placed in position for the procedure, over the fluoroscopy-compatible table. Fluoroscopy was manipulated, using "Tunnel Vision Technique", to obtain the best possible view of the target area, on the affected side. Parallax error was corrected before commencing the procedure. A "direction-depth-direction" technique was used to introduce the needle under continuous pulsed fluoroscopic guidance. Once the target was reached, antero-posterior, oblique, and lateral fluoroscopic projection views were taken to confirm needle placement in all planes. Electronic images uploaded into EMR.  Interpretation: Successful epidural injection.  Intraoperative imaging interpretation by performing Physician.    Post-op assessment  Post-procedure Vital Signs:  Pulse/HCG Rate: (!) 114(!) 107 Temp: (!) 97.5 F (36.4 C) Resp: 16 BP: (!) 187/100 (encouraged to f/up with PCP) SpO2: 97 %  EBL: None  Complications: No immediate post-treatment complications observed by team, or reported by patient.  Note: The patient tolerated the entire procedure well. A repeat set of vitals were taken after the procedure and the patient  was kept under observation following institutional policy, for this type of procedure. Post-procedural neurological assessment was performed, showing return to baseline, prior to discharge. The patient was provided with post-procedure discharge instructions, including a section on how to identify potential problems. Should any problems arise concerning this procedure, the patient was given instructions to immediately contact us, at any time, without hesitation. In any case, we plan to contact the patient by telephone for a follow-up status report regarding this interventional procedure.  Comments:  No additional relevant information.   Plan of care  Chronic Opioid Analgesic:  None MME/day: 0 mg/day   Medications administered: We administered iohexol, lidocaine, sodium chloride flush, ropivacaine (PF) 2 mg/mL (0.2%), and dexamethasone.  Follow-up plan:   Return in about 2 weeks (around 07/11/2021) for Proc-day (T,Th), (F2F), (MM).      Interventional Therapies  Risk   Complexity Considerations:   Estimated body mass index is 27.02 kg/m as calculated from the following:   Height as of this encounter: 5' 1"  (1.549 m).   Weight as of this encounter: 143 lb (64.9 kg). WNL   Planned   Pending:   Diagnostic right cervical ESI #1    Under consideration:   Diagnostic bilateral suprascapular NB #1  Possible bilateral suprascapular nerve RFA  Diagnostic bilateral AC joint inj. #1  Diagnostic bilateral glenohumeral  joint inj. #1  Diagnostic bilateral subacromial/subdeltoid bursa inj. #1  Diagnostic bilateral cervical facet MBB #1    Completed:   None at this time   Therapeutic   Palliative (PRN) options:   None established     Recent Visits Date Type Provider Dept  06/24/21 Office Visit Milinda Pointer, MD [facility]  05/22/21 Office Visit Milinda Pointer, MD [facility]  Showing recent visits within past 90 days and meeting all other requirements Today's Visits Date Type Provider Dept  06/27/21 Procedure visit Milinda Pointer, MD [facility]  Showing today's visits and meeting all other requirements Future Appointments Date Type Provider Dept  07/24/21 Appointment Milinda Pointer, MD [facility]  Showing future appointments within next 90 days and meeting all other requirements   Disposition: Discharge home  Discharge (Date   Time): 06/27/2021; 0906 hrs.   Primary Care Physician: Sharyne Peach, MD Location: [facility] Note by: Gaspar Cola, MD Date: 06/27/2021; Time: 10:44 AM  DISCLAIMER: Medicine is not an Chief Strategy Officer. It has no guarantees or warranties. The decision to proceed with this intervention was based on the information collected from the patient. Conclusions were drawn from the patient's questionnaire, interview, and examination. Because information was provided in large part by the patient, it cannot be guaranteed that it has not been purposely or unconsciously manipulated or altered. Every effort has been made to obtain as much accurate, relevant, available data as possible. Always take into account that the treatment will also be dependent on availability of resources and existing treatment guidelines, considered by other Pain Management Specialists as being common knowledge and practice, at the time of the intervention. It is also important to point out that variation in procedural  techniques and pharmacological choices are the acceptable norm. For Medico-Legal review purposes, the indications, contraindications, technique, and results of the these procedures should only be evaluated, judged and interpreted by a Board-Certified Interventional Pain Specialist with extensive familiarity and expertise in the same exact procedure and technique.

## 2021-06-27 ENCOUNTER — Ambulatory Visit (HOSPITAL_BASED_OUTPATIENT_CLINIC_OR_DEPARTMENT_OTHER): Payer: Medicare Other | Admitting: Pain Medicine

## 2021-06-27 ENCOUNTER — Other Ambulatory Visit: Payer: Self-pay

## 2021-06-27 ENCOUNTER — Ambulatory Visit
Admission: RE | Admit: 2021-06-27 | Discharge: 2021-06-27 | Disposition: A | Discharge status: Home / Self Care | Payer: Medicare Other | Source: Ambulatory Visit | Attending: Pain Medicine | Admitting: Pain Medicine

## 2021-06-27 ENCOUNTER — Encounter: Payer: Self-pay | Admitting: Pain Medicine

## 2021-06-27 VITALS — BP 187/100 | HR 114 | Temp 97.5°F | Resp 16 | Ht 61.0 in | Wt 143.0 lb

## 2021-06-27 DIAGNOSIS — Z9189 Other specified personal risk factors, not elsewhere classified: Secondary | ICD-10-CM | POA: Insufficient documentation

## 2021-06-27 DIAGNOSIS — Z91040 Latex allergy status: Secondary | ICD-10-CM | POA: Insufficient documentation

## 2021-06-27 DIAGNOSIS — M4312 Spondylolisthesis, cervical region: Secondary | ICD-10-CM | POA: Insufficient documentation

## 2021-06-27 DIAGNOSIS — M5030 Other cervical disc degeneration, unspecified cervical region: Secondary | ICD-10-CM | POA: Insufficient documentation

## 2021-06-27 DIAGNOSIS — G8929 Other chronic pain: Secondary | ICD-10-CM

## 2021-06-27 DIAGNOSIS — M7918 Myalgia, other site: Secondary | ICD-10-CM

## 2021-06-27 DIAGNOSIS — M79602 Pain in left arm: Secondary | ICD-10-CM | POA: Diagnosis present

## 2021-06-27 DIAGNOSIS — M4722 Other spondylosis with radiculopathy, cervical region: Secondary | ICD-10-CM | POA: Insufficient documentation

## 2021-06-27 DIAGNOSIS — M4802 Spinal stenosis, cervical region: Secondary | ICD-10-CM | POA: Insufficient documentation

## 2021-06-27 DIAGNOSIS — M79601 Pain in right arm: Secondary | ICD-10-CM | POA: Insufficient documentation

## 2021-06-27 DIAGNOSIS — M47812 Spondylosis without myelopathy or radiculopathy, cervical region: Secondary | ICD-10-CM | POA: Insufficient documentation

## 2021-06-27 DIAGNOSIS — M4310 Spondylolisthesis, site unspecified: Secondary | ICD-10-CM | POA: Diagnosis present

## 2021-06-27 DIAGNOSIS — M542 Cervicalgia: Secondary | ICD-10-CM

## 2021-06-27 MED ORDER — ROPIVACAINE HCL 2 MG/ML IJ SOLN
1.0000 mL | Freq: Once | INTRAMUSCULAR | Status: AC
  Administered 2021-06-27: 09:00:00 1 mL via EPIDURAL
  Filled 2021-06-27: qty 20

## 2021-06-27 MED ORDER — SODIUM CHLORIDE (PF) 0.9 % IJ SOLN
INTRAMUSCULAR | Status: AC
  Filled 2021-06-27: qty 10

## 2021-06-27 MED ORDER — LACTATED RINGERS IV SOLN: 1000.0000 mL | Freq: Once | INTRAVENOUS | Status: DC

## 2021-06-27 MED ORDER — MIDAZOLAM HCL 5 MG/5ML IJ SOLN: 0.5000 mg | Freq: Once | INTRAMUSCULAR | Status: DC

## 2021-06-27 MED ORDER — LIDOCAINE HCL 2 % IJ SOLN
20.0000 mL | Freq: Once | INTRAMUSCULAR | Status: AC
  Administered 2021-06-27: 09:00:00 200 mg
  Filled 2021-06-27: qty 20

## 2021-06-27 MED ORDER — IOHEXOL 180 MG/ML  SOLN
10.0000 mL | Freq: Once | INTRAMUSCULAR | Status: AC
  Administered 2021-06-27: 09:00:00 5 mL via EPIDURAL

## 2021-06-27 MED ORDER — SODIUM CHLORIDE 0.9% FLUSH
1.0000 mL | Freq: Once | INTRAVENOUS | Status: AC
  Administered 2021-06-27: 09:00:00 1 mL

## 2021-06-27 MED ORDER — DEXAMETHASONE SODIUM PHOSPHATE 10 MG/ML IJ SOLN
10.0000 mg | Freq: Once | INTRAMUSCULAR | Status: AC
  Administered 2021-06-27: 09:00:00 10 mg
  Filled 2021-06-27: qty 1

## 2021-06-27 MED ORDER — PENTAFLUOROPROP-TETRAFLUOROETH EX AERO
INHALATION_SPRAY | Freq: Once | CUTANEOUS | Status: DC
  Filled 2021-06-27: qty 116

## 2021-06-27 NOTE — Patient Instructions (Signed)

## 2021-06-27 NOTE — Progress Notes (Signed)
Safety precautions to be maintained throughout the outpatient stay will include: orient to surroundings, keep bed in low position, maintain call bell within reach at all times, provide assistance with transfer out of bed and ambulation.  

## 2021-06-28 ENCOUNTER — Telehealth: Payer: Self-pay

## 2021-06-28 NOTE — Telephone Encounter (Signed)
Post procedure follow up.  Patient states she is doing well.   ?

## 2021-07-02 ENCOUNTER — Telehealth: Payer: Self-pay | Admitting: Pain Medicine

## 2021-07-02 NOTE — Telephone Encounter (Signed)
Returned patient phone call to let her know that absolutely the steroids can affect her blood sugar.  Patient states that they have been running over 200.  Explained that sugars should return to normal once steroids are out of her system.  Patient verbalizes u/o information.  ?

## 2021-07-22 NOTE — Progress Notes (Signed)
PROVIDER NOTE: Information contained herein reflects review and annotations entered in association with encounter. Interpretation of such information and data should be left to medically-trained personnel. Information provided to patient can be located elsewhere in the medical record under "Patient Instructions". Document created using STT-dictation technology, any transcriptional errors that may result from process are unintentional.  ?  ?Patient: Caroline Hughes  Service Category: E/M  Provider: Gaspar Cola, MD  ?DOB: 06/12/1960  DOS: 07/24/2021  Specialty: Interventional Pain Management  ?MRN: [id number]  Setting: Ambulatory outpatient  PCP: Sharyne Peach, MD  ?Type: Established Patient    Referring Provider: Sharyne Peach, MD  ?Location: Office  Delivery: Face-to-face    ? ?HPI  ?Caroline Hughes, a 61 y.o. 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 Caroline Hughes's primary complain today is Shoulder Pain ?Last encounter: My last encounter with her was on 07/02/2021. ?Pertinent problems: [name] has Cervical myofascial pain syndrome; Controlled type 2 diabetes mellitus with diabetic nephropathy, with long-term current use of insulin ([place]); Fibromyalgia; DDD (degenerative disc disease), cervical; Spondylosis of cervical region without myelopathy or radiculopathy; Chronic pain syndrome; Osteoarthritis; Peripheral polyneuropathy; Piriformis syndrome, right; SI (sacroiliac) joint dysfunction; Trigger middle finger of left hand; Acute non intractable tension-type headache; Arthritis of both hands; Chronic shoulder pain (1ry area of Pain) (Bilateral) (R>L); Right hand pain; Chronic low back pain (3ry area of Pain) (Bilateral) (L>R) w/o sciatica; Chronic neck pain (5th area of Pain) (Bilateral) (R>L); 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, C5-6) (Left: C3-4); 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, C5-6, C6-7); Cervical Retrolisthesis (1.5  mm) of C4/C5; Cervical facet arthropathy (C3-4, C4-5, C5-6); Abnormal MRI, shoulder (Bilateral) (07/25/2020); Abnormal EMG (electromyogram) (03/18/2021; 03/25/2021); Chronic vaginal pain (2ry area of Pain); Chronic anal or rectal pain (2ry area of Pain); Chronic abdominal pain (4th area of Pain); Chronic occipital headache (6th area of Pain) (Bilateral) (R>L); Cervical facet syndrome (Bilateral); Chronic upper extremity pain (7th area of Pain) (Bilateral) (R>L); Grade 1 (12m) Anterolisthesis of cervical spine (C3/C4); Tendinopathy of rotator cuff (Right); Supraspinatus syndrome (Right); Subacromial bursitis of shoulder joint (Right); Subdeltoid bursitis (Right); Osteoarthritis of AC (acromioclavicular) joint (Right); Tendinopathy of rotator cuff (Left); Supraspinatus syndrome (Left); Osteoarthritis of AC (acromioclavicular) joint (Left); Subacromial bursitis of shoulder joint (Left); Subdeltoid bursitis (Left); Lumbar facet arthropathy; Lumbar facet syndrome; Pseudoarthrosis of lumbar spine (Right: L5-S1); Bertolotti's syndrome (Right: L5-S1); and Cervical spondylosis with radiculopathy on their pertinent problem list. ?Pain Assessment: Severity of Chronic pain is reported as a 8 /10. Location: Shoulder Right, Left/pain radiaties down both arm. Onset: More than a month ago. Quality: [place] Timing: Constant. Modifying factor(s): heat or ice, meds. ?Vitals:  height is 5' 1"  (1.549 m) and weight is 145 lb (65.8 kg). Her temperature is 97 ?F (36.1 ?C) (abnormal). Her blood pressure is 151/76 (abnormal) and her pulse is 108 (abnormal). Her oxygen saturation is 100%.  ? ?Reason for encounter: post-procedure evaluation and assessment.  The patient indicates having attained 100% relief of the pain for the duration of the local anesthetic.  There 100% relief continued for approximately 2-1/2 weeks and after that it has started coming back.  Currently she still has an ongoing 70 to 90% relief of her neck pain with 50%  ongoing relief of her bilateral shoulder pain, but the left side still worse than the right.  In terms of her upper extremity symptoms, she had 100% relief of the pain for those 2-1/2 weeks, but unfortunately  the pain has returned and it is back to baseline. ? ?Today I took the time to explain to the patient the role of the local anesthetic and steroids in the diagnosis of these conditions and the part that they play in the diagnostic nerve blocks.  She indicates having understood.  Based on the fact that some of the pain has returned, I have recommended that we go ahead and complete a series of 3 cervical epidural steroid injections and then move onto the treatment of her shoulder arthralgia.  The plan was shared with the patient who understood and accepted. ? ?Post-procedure evaluation  ? Type: Cervical Epidural Steroid injection (ESI) (Interlaminar) #1  ?Laterality: Right  ?Level: C7-T1 ?Imaging: Fluoroscopy-assisted ?DOS: 06/27/2021  ?Performed by: Milinda Pointer, MD ?Anesthesia: Local anesthesia (1-2% Lidocaine) ?Anxiolysis: None                 ?Sedation: None.  ? ?Purpose: Diagnostic/Therapeutic ?Indications: Cervicalgia, cervical radicular pain, degenerative disc disease, severe enough to impact quality of life or function. ?1. Cervical spondylosis with radiculopathy   ?2. Chronic upper extremity pain (7th area of Pain) (Bilateral) (R>L)   ?3. Chronic neck pain (5th area of Pain) (Bilateral) (R>L)   ?4. 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, C5-6, C6-7)   ?5. 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, C5-6) (Left: C3-4)   ?6. Cervical facet arthropathy (C3-4, C4-5, C5-6)   ?7. Cervical Retrolisthesis (1.5 mm) of C4/C5   ?8. Grade 1 (53m) Anterolisthesis of cervical spine (C3/C4)   ?9. DDD (degenerative disc disease), cervical   ?10. Cervical myofascial pain syndrome   ? Latex precautions, history of latex allergy   ? At high risk for allergic reaction to latex   ? History of allergy to latex    ? ?NAS-11 score:  ? Pre-procedure: 7 /10  ? Post-procedure: 0-No pain/10  ?  ?Effectiveness:  ?Initial hour after procedure: 100 %. ?Subsequent 4-6 hours post-procedure: 100 %. ?Analgesia past initial 6 hours: 100 % (lasted 2 1/2 weeks). ?Ongoing improvement:  ?Analgesic: The patient indicates having a 70-90% ongoing improvement of her neck pain, 50% ongoing improvement of her shoulder pain with the left side being still worse than the right, and unfortunately the upper extremity symptoms have returned to baseline. ?Function: Ms. PYasinreports improvement in function ?ROM: Ms. PTopreports improvement in ROM ? ?Pharmacotherapy Assessment  ?Analgesic: None ?MME/day: 0 mg/day  ? ?Monitoring: ?[place] PMP: PDMP reviewed during this encounter.       ?Pharmacotherapy: No side-effects or adverse reactions reported. ?Compliance: No problems identified. ?Effectiveness: Clinically acceptable. ? ?BChauncey Fischer RN  07/24/2021  8:34 AM  Sign when Signing Visit ?Safety precautions to be maintained throughout the outpatient stay will include: orient to surroundings, keep bed in low position, maintain call bell within reach at all times, provide assistance with transfer out of bed and ambulation.  ?   UDS:  ?Summary  ?Date Value Ref Range Status  ?05/22/2021 Note  Final  ?  Comment:  ?  ==================================================================== ?Compliance Drug Analysis, Ur ?==================================================================== ?Test                             Result       Flag       Units ? ?Drug Present and Declared for Prescription Verification ?  Tramadol                       >[id number]  EXPECTED   ng/mg creat ?  O-Desmethyltramadol            >5882        EXPECTED   ng/mg creat ?  N-Desmethyltramadol            >5882        EXPECTED   ng/mg creat ?   Source of tramadol is a prescription medication. O-desmethyltramadol ?   and N-desmethyltramadol are expected metabolites of tramadol. ? ?   Tizanidine                     PRESENT      EXPECTED ?  Citalopram                     PRESENT      EXPECTED ?  Desmethylcitalopram            PRESENT      EXPECTED ?   Desmethylcitalopram is an expected metabolite o

## 2021-07-24 ENCOUNTER — Encounter: Payer: Self-pay | Admitting: Pain Medicine

## 2021-07-24 ENCOUNTER — Ambulatory Visit: Payer: Medicare Other | Attending: Pain Medicine | Admitting: Pain Medicine

## 2021-07-24 VITALS — BP 151/76 | HR 108 | Temp 97.0°F | Ht 61.0 in | Wt 145.0 lb

## 2021-07-24 DIAGNOSIS — G8929 Other chronic pain: Secondary | ICD-10-CM

## 2021-07-24 DIAGNOSIS — M7918 Myalgia, other site: Secondary | ICD-10-CM | POA: Diagnosis present

## 2021-07-24 DIAGNOSIS — G894 Chronic pain syndrome: Secondary | ICD-10-CM | POA: Diagnosis present

## 2021-07-24 DIAGNOSIS — M545 Low back pain, unspecified: Secondary | ICD-10-CM | POA: Insufficient documentation

## 2021-07-24 DIAGNOSIS — M25512 Pain in left shoulder: Secondary | ICD-10-CM | POA: Insufficient documentation

## 2021-07-24 DIAGNOSIS — M79602 Pain in left arm: Secondary | ICD-10-CM | POA: Insufficient documentation

## 2021-07-24 DIAGNOSIS — M4312 Spondylolisthesis, cervical region: Secondary | ICD-10-CM | POA: Diagnosis present

## 2021-07-24 DIAGNOSIS — M542 Cervicalgia: Secondary | ICD-10-CM | POA: Diagnosis present

## 2021-07-24 DIAGNOSIS — R102 Pelvic and perineal pain: Secondary | ICD-10-CM | POA: Diagnosis present

## 2021-07-24 DIAGNOSIS — M47812 Spondylosis without myelopathy or radiculopathy, cervical region: Secondary | ICD-10-CM | POA: Diagnosis present

## 2021-07-24 DIAGNOSIS — M4802 Spinal stenosis, cervical region: Secondary | ICD-10-CM

## 2021-07-24 DIAGNOSIS — R109 Unspecified abdominal pain: Secondary | ICD-10-CM | POA: Diagnosis present

## 2021-07-24 DIAGNOSIS — R94131 Abnormal electromyogram [EMG]: Secondary | ICD-10-CM

## 2021-07-24 DIAGNOSIS — R519 Headache, unspecified: Secondary | ICD-10-CM | POA: Diagnosis present

## 2021-07-24 DIAGNOSIS — M25511 Pain in right shoulder: Secondary | ICD-10-CM | POA: Diagnosis present

## 2021-07-24 DIAGNOSIS — M4722 Other spondylosis with radiculopathy, cervical region: Secondary | ICD-10-CM | POA: Diagnosis present

## 2021-07-24 DIAGNOSIS — M4310 Spondylolisthesis, site unspecified: Secondary | ICD-10-CM | POA: Diagnosis present

## 2021-07-24 DIAGNOSIS — M79601 Pain in right arm: Secondary | ICD-10-CM | POA: Insufficient documentation

## 2021-07-24 DIAGNOSIS — M5030 Other cervical disc degeneration, unspecified cervical region: Secondary | ICD-10-CM | POA: Diagnosis present

## 2021-07-24 NOTE — Patient Instructions (Signed)
______________________________________________________________________ ? ?Preparing for Procedure with Sedation ? ?NOTICE: Due to recent regulatory changes, starting on November 19, 2020, procedures requiring intravenous (IV) sedation will no longer be performed at the [facility].  These types of procedures are required to be performed at [facility] ambulatory surgery facility.  We are very sorry for the inconvenience. ? ?Procedure appointments are limited to planned procedures: ?No Prescription Refills. ?No disability issues will be discussed. ?No medication changes will be discussed. ? ?Instructions: ?Oral Intake: Do not eat or drink anything for at least 8 hours prior to your procedure. (Exception: Blood Pressure Medication. See below.) ?Transportation: A driver is required. You may not drive yourself after the procedure. ?Blood Pressure Medicine: Do not forget to take your blood pressure medicine with a sip of water the morning of the procedure. If your Diastolic (lower reading) is above 100 mmHg, elective cases will be cancelled/rescheduled. ?Blood thinners: These will need to be stopped for procedures. Notify our staff if you are taking any blood thinners. Depending on which one you take, there will be specific instructions on how and when to stop it. ?Diabetics on insulin: Notify the staff so that you can be scheduled 1st case in the morning. If your diabetes requires high dose insulin, take only ? of your normal insulin dose the morning of the procedure and notify the staff that you have done so. ?Preventing infections: Shower with an antibacterial soap the morning of your procedure. ?Build-up your immune system: Take 1000 mg of Vitamin C with every meal (3 times a day) the day prior to your procedure. ?Antibiotics: Inform the staff if you have a condition or reason that requires you to take antibiotics before dental procedures. ?Pregnancy: If you are pregnant, call and cancel the procedure. ?Sickness: If  you have a cold, fever, or any active infections, call and cancel the procedure. ?Arrival: You must be in the facility at least 30 minutes prior to your scheduled procedure. ?Children: Do not bring children with you. ?Dress appropriately: Bring dark clothing that you would not mind if they get stained. ?Valuables: Do not bring any jewelry or valuables. ? ?Reasons to call and reschedule or cancel your procedure: (Following these recommendations will minimize the risk of a serious complication.) ?Surgeries: Avoid having procedures within 2 weeks of any surgery. (Avoid for 2 weeks before or after any surgery). ?Flu Shots: Avoid having procedures within 2 weeks of a flu shots. (Avoid for 2 weeks before or after immunizations). ?Barium: Avoid having a procedure within 7-10 days after having had a radiological study involving the use of radiological contrast. (Myelograms, Barium swallow or enema study). ?Heart attacks: Avoid any elective procedures or surgeries for the initial 6 months after a "Myocardial Infarction" (Heart Attack). ?Blood thinners: It is imperative that you stop these medications before procedures. Let us know if you if you take any blood thinner.  ?Infection: Avoid procedures during or within two weeks of an infection (including chest colds or gastrointestinal problems). Symptoms associated with infections include: Localized redness, fever, chills, night sweats or profuse sweating, burning sensation when voiding, cough, congestion, stuffiness, runny nose, sore throat, diarrhea, nausea, vomiting, cold or Flu symptoms, recent or current infections. It is specially important if the infection is over the area that we intend to treat. ?Heart and lung problems: Symptoms that may suggest an active cardiopulmonary problem include: cough, chest pain, breathing difficulties or shortness of breath, dizziness, ankle swelling, uncontrolled high or unusually low blood pressure, and/or palpitations. If you are  experiencing any of these symptoms, cancel your procedure and contact your primary care physician for an evaluation. ? ?Remember:  ?Regular Business hours are:  ?Monday to Thursday 8:00 AM to 4:00 PM ? ?Provider's Schedule: ?Milinda Pointer, MD:  ?Procedure days: Tuesday and Thursday 7:30 AM to 4:00 PM ? ?Gillis Santa, MD:  ?Procedure days: Monday and Wednesday 7:30 AM to 4:00 PM ?______________________________________________________________________ ? ____________________________________________________________________________________________ ? ?General Risks and Possible Complications ? ?Patient Responsibilities: It is important that you read this as it is part of your informed consent. It is our duty to inform you of the risks and possible complications associated with treatments offered to you. It is your responsibility as a patient to read this and to ask questions about anything that is not clear or that you believe was not covered in this document. ? ?Patient?s Rights: You have the right to refuse treatment. You also have the right to change your mind, even after initially having agreed to have the treatment done. However, under this last option, if you wait until the last second to change your mind, you may be charged for the materials used up to that point. ? ?Introduction: Medicine is not an Chief Strategy Officer. Everything in Medicine, including the lack of treatment(s), carries the potential for danger, harm, or loss (which is by definition: Risk). In Medicine, a complication is a secondary problem, condition, or disease that can aggravate an already existing one. All treatments carry the risk of possible complications. The fact that a side effects or complications occurs, does not imply that the treatment was conducted incorrectly. It must be clearly understood that these can happen even when everything is done following the highest safety standards. ? ?No treatment: You can choose not to proceed with the  proposed treatment alternative. The ?PRO(s)? would include: avoiding the risk of complications associated with the therapy. The ?CON(s)? would include: not getting any of the treatment benefits. These benefits fall under one of three categories: diagnostic; therapeutic; and/or palliative. Diagnostic benefits include: getting information which can ultimately lead to improvement of the disease or symptom(s). Therapeutic benefits are those associated with the successful treatment of the disease. Finally, palliative benefits are those related to the decrease of the primary symptoms, without necessarily curing the condition (example: decreasing the pain from a flare-up of a chronic condition, such as incurable terminal cancer). ? ?General Risks and Complications: These are associated to most interventional treatments. They can occur alone, or in combination. They fall under one of the following six (6) categories: no benefit or worsening of symptoms; bleeding; infection; nerve damage; allergic reactions; and/or death. ?No benefits or worsening of symptoms: In Medicine there are no guarantees, only probabilities. No healthcare provider can ever guarantee that a medical treatment will work, they can only state the probability that it may. Furthermore, there is always the possibility that the condition may worsen, either directly, or indirectly, as a consequence of the treatment. ?Bleeding: This is more common if the patient is taking a blood thinner, either prescription or over the counter (example: Goody Powders, Fish oil, Aspirin, Garlic, etc.), or if suffering a condition associated with impaired coagulation (example: Hemophilia, cirrhosis of the liver, low platelet counts, etc.). However, even if you do not have one on these, it can still happen. If you have any of these conditions, or take one of these drugs, make sure to notify your treating physician. ?Infection: This is more common in patients with a compromised  immune system, either due to disease (example:  diabetes, cancer, human immunodeficiency virus [HIV], etc.), or due to medications or treatments (example: therapies used to treat cancer and rheumatological diseas

## 2021-07-24 NOTE — Progress Notes (Signed)
Safety precautions to be maintained throughout the outpatient stay will include: orient to surroundings, keep bed in low position, maintain call bell within reach at all times, provide assistance with transfer out of bed and ambulation.  

## 2021-07-30 ENCOUNTER — Ambulatory Visit (HOSPITAL_BASED_OUTPATIENT_CLINIC_OR_DEPARTMENT_OTHER): Payer: Medicare Other | Admitting: Pain Medicine

## 2021-07-30 ENCOUNTER — Ambulatory Visit
Admission: RE | Admit: 2021-07-30 | Discharge: 2021-07-30 | Disposition: A | Discharge status: Home / Self Care | Payer: Medicare Other | Source: Ambulatory Visit | Attending: Pain Medicine | Admitting: Pain Medicine

## 2021-07-30 ENCOUNTER — Encounter: Payer: Self-pay | Admitting: Pain Medicine

## 2021-07-30 VITALS — BP 185/104 | HR 103 | Temp 98.0°F | Resp 12 | Ht 61.0 in | Wt 145.0 lb

## 2021-07-30 DIAGNOSIS — M4310 Spondylolisthesis, site unspecified: Secondary | ICD-10-CM

## 2021-07-30 DIAGNOSIS — M4802 Spinal stenosis, cervical region: Secondary | ICD-10-CM

## 2021-07-30 DIAGNOSIS — M5030 Other cervical disc degeneration, unspecified cervical region: Secondary | ICD-10-CM

## 2021-07-30 DIAGNOSIS — G8929 Other chronic pain: Secondary | ICD-10-CM | POA: Insufficient documentation

## 2021-07-30 DIAGNOSIS — Z9189 Other specified personal risk factors, not elsewhere classified: Secondary | ICD-10-CM

## 2021-07-30 DIAGNOSIS — M5412 Radiculopathy, cervical region: Secondary | ICD-10-CM | POA: Diagnosis present

## 2021-07-30 DIAGNOSIS — M25511 Pain in right shoulder: Secondary | ICD-10-CM | POA: Diagnosis present

## 2021-07-30 DIAGNOSIS — M47812 Spondylosis without myelopathy or radiculopathy, cervical region: Secondary | ICD-10-CM

## 2021-07-30 DIAGNOSIS — M4722 Other spondylosis with radiculopathy, cervical region: Secondary | ICD-10-CM | POA: Insufficient documentation

## 2021-07-30 DIAGNOSIS — Z91040 Latex allergy status: Secondary | ICD-10-CM | POA: Insufficient documentation

## 2021-07-30 DIAGNOSIS — M79602 Pain in left arm: Secondary | ICD-10-CM | POA: Diagnosis present

## 2021-07-30 DIAGNOSIS — M25512 Pain in left shoulder: Secondary | ICD-10-CM | POA: Diagnosis present

## 2021-07-30 DIAGNOSIS — M542 Cervicalgia: Secondary | ICD-10-CM | POA: Insufficient documentation

## 2021-07-30 DIAGNOSIS — M79601 Pain in right arm: Secondary | ICD-10-CM | POA: Insufficient documentation

## 2021-07-30 MED ORDER — SODIUM CHLORIDE 0.9% FLUSH
1.0000 mL | Freq: Once | INTRAVENOUS | Status: AC
  Administered 2021-07-30: 1 mL

## 2021-07-30 MED ORDER — DEXAMETHASONE SODIUM PHOSPHATE 10 MG/ML IJ SOLN
10.0000 mg | Freq: Once | INTRAMUSCULAR | Status: AC
  Administered 2021-07-30: 10 mg
  Filled 2021-07-30: qty 1

## 2021-07-30 MED ORDER — LACTATED RINGERS IV SOLN: 1000.0000 mL | Freq: Once | INTRAVENOUS | Status: DC

## 2021-07-30 MED ORDER — ROPIVACAINE HCL 2 MG/ML IJ SOLN
1.0000 mL | Freq: Once | INTRAMUSCULAR | Status: AC
  Administered 2021-07-30: 1 mL via EPIDURAL
  Filled 2021-07-30: qty 20

## 2021-07-30 MED ORDER — LIDOCAINE HCL 2 % IJ SOLN
20.0000 mL | Freq: Once | INTRAMUSCULAR | Status: AC
  Administered 2021-07-30: 400 mg
  Filled 2021-07-30: qty 20

## 2021-07-30 MED ORDER — IOHEXOL 180 MG/ML  SOLN
INTRAMUSCULAR | Status: AC
  Filled 2021-07-30: qty 20

## 2021-07-30 MED ORDER — PENTAFLUOROPROP-TETRAFLUOROETH EX AERO
INHALATION_SPRAY | Freq: Once | CUTANEOUS | Status: AC
  Administered 2021-07-30: 30 via TOPICAL
  Filled 2021-07-30: qty 116

## 2021-07-30 MED ORDER — IOHEXOL 180 MG/ML  SOLN
10.0000 mL | Freq: Once | INTRAMUSCULAR | Status: AC
  Administered 2021-07-30: 10 mL via EPIDURAL
  Filled 2021-07-30: qty 20

## 2021-07-30 MED ORDER — MIDAZOLAM HCL 5 MG/5ML IJ SOLN: 0.5000 mg | Freq: Once | INTRAMUSCULAR | Status: DC

## 2021-07-30 MED ORDER — SODIUM CHLORIDE (PF) 0.9 % IJ SOLN
INTRAMUSCULAR | Status: AC
  Filled 2021-07-30: qty 10

## 2021-07-30 NOTE — Progress Notes (Signed)
Safety precautions to be maintained throughout the outpatient stay will include: orient to surroundings, keep bed in low position, maintain call bell within reach at all times, provide assistance with transfer out of bed and ambulation.  

## 2021-07-30 NOTE — Patient Instructions (Addendum)
____________________________________________________________________________________________ ? ?Post-Procedure Discharge Instructions ? ?Instructions: ?Apply ice:  ?Purpose: This will minimize any swelling and discomfort after procedure.  ?When: Day of procedure, as soon as you get home. ?How: Fill a plastic sandwich bag with crushed ice. Cover it with a small towel and apply to injection site. ?How long: (15 min on, 15 min off) Apply for 15 minutes then remove x 15 minutes.  Repeat sequence on day of procedure, until you go to bed. ?Apply heat:  ?Purpose: To treat any soreness and discomfort from the procedure. ?When: Starting the next day after the procedure. ?How: Apply heat to procedure site starting the day following the procedure. ?How long: May continue to repeat daily, until discomfort goes away. ?Food intake: Start with clear liquids (like water) and advance to regular food, as tolerated.  ?Physical activities: Keep activities to a minimum for the first 8 hours after the procedure. After that, then as tolerated. ?Driving: If you have received any sedation, be responsible and do not drive. You are not allowed to drive for 24 hours after having sedation. ?Blood thinner: (Applies only to those taking blood thinners) You may restart your blood thinner 6 hours after your procedure. ?Insulin: (Applies only to Diabetic patients taking insulin) As soon as you can eat, you may resume your normal dosing schedule. ?Infection prevention: Keep procedure site clean and dry. Shower daily and clean area with soap and water. ?Post-procedure Pain Diary: Extremely important that this be done correctly and accurately. Recorded information will be used to determine the next step in treatment. For the purpose of accuracy, follow these rules: ?Evaluate only the area treated. Do not report or include pain from an untreated area. For the purpose of this evaluation, ignore all other areas of pain, except for the treated area. ?After  your procedure, avoid taking a long nap and attempting to complete the pain diary after you wake up. Instead, set your alarm clock to go off every hour, on the hour, for the initial 8 hours after the procedure. Document the duration of the numbing medicine, and the relief you are getting from it. ?Do not go to sleep and attempt to complete it later. It will not be accurate. If you received sedation, it is likely that you were given a medication that may cause amnesia. Because of this, completing the diary at a later time may cause the information to be inaccurate. This information is needed to plan your care. ?Follow-up appointment: Keep your post-procedure follow-up evaluation appointment after the procedure (usually 2 weeks for most procedures, 6 weeks for radiofrequencies). DO NOT FORGET to bring you pain diary with you.  ? ?Expect: (What should I expect to see with my procedure?) ?From numbing medicine (AKA: Local Anesthetics): Numbness or decrease in pain. You may also experience some weakness, which if present, could last for the duration of the local anesthetic. ?Onset: Full effect within 15 minutes of injected. ?Duration: It will depend on the type of local anesthetic used. On the average, 1 to 8 hours.  ?From steroids (Applies only if steroids were used): Decrease in swelling or inflammation. Once inflammation is improved, relief of the pain will follow. ?Onset of benefits: Depends on the amount of swelling present. The more swelling, the longer it will take for the benefits to be seen. In some cases, up to 10 days. ?Duration: Steroids will stay in the system x 2 weeks. Duration of benefits will depend on multiple posibilities including persistent irritating factors. ?Side-effects: If present, they  may typically last 2 weeks (the duration of the steroids). ?Frequent: Cramps (if they occur, drink Gatorade and take over-the-counter Magnesium 450-500 mg once to twice a day); water retention with temporary  weight gain; increases in blood sugar; decreased immune system response; increased appetite. ?Occasional: Facial flushing (red, warm cheeks); mood swings; menstrual changes. ?Uncommon: Long-term decrease or suppression of natural hormones; bone thinning. (These are more common with higher doses or more frequent use. This is why we prefer that our patients avoid having any injection therapies in other practices.)  ?Very Rare: Severe mood changes; psychosis; aseptic necrosis. ?From procedure: Some discomfort is to be expected once the numbing medicine wears off. This should be minimal if ice and heat are applied as instructed. ? ?Call if: (When should I call?) ?You experience numbness and weakness that gets worse with time, as opposed to wearing off. ?New onset bowel or bladder incontinence. (Applies only to procedures done in the spine) ? ?Emergency Numbers: ?[place] business hours (Monday - Thursday, 8:00 AM - 4:00 PM) (Friday, 9:00 AM - 12:00 Noon): [phone] ?After hours: [phone] ?NOTE: If you are having a problem and are unable connect with, or to talk to a provider, then go to your nearest urgent care or emergency department. If the problem is serious and urgent, please call 911. ?____________________________________________________________________________________________ ? ____________________________________________________________________________________________ ? ?Virtual Visits  ? ?What is a Manufacturing systems engineer Visit"? ?It is a Metallurgist (medical visit) that takes place on real time (NOT TEXT or E-MAIL) over the telephone or computer device (desktop, laptop, tablet, smart phone, etc.). It allows for more location flexibility between the patient and the healthcare provider. ? ?Who decides when these types of visits will be used? ?The physician. ? ?Who is eligible for these types of visits? ?Only those patients that can be reliably reached over the telephone. ? ?What do you mean by  reliably? ?We do not have time to call everyone multiple times, therefore those that tend to screen calls and then call back later are not suitable candidates for this system. We understand how people are reluctant to pickup on "unknown" calls, therefore, we suggest adding our telephone numbers to your list of "CONTACT(s)". This way, you should be able to readily identify our calls when you receive one. All of our numbers are available below.  ? ?Who is not eligible? ?This option is not available for medication management encounters, specially for controlled substances. Patients on pain medications that fall under the category of controlled substances have to come in for "Face-to-Face" encounters. This is required for mandatory monitoring of these substances. You may be asked to provide a sample for an unannounced urine drug screening test (UDS), and we will need to count your pain pills. Not bringing your pills to be counted may result in no refill. Obviously, neither one of these can be done over the phone. ? ?When will this type of visits be used? ?You can request a virtual visit whenever you are physically unable to attend a regular appointment. The decision will be made by the physician (or healthcare provider) on a case by case basis.  ? ?At what time will I be called? ?This is an excellent question. The providers will try to call you whenever they have time available. Do not expect to be called at any specific time. The secretaries will assign you a time for your virtual visit appointment, but this is done simply to keep a list of those patients that need to be called,  but not for the purpose of keeping a time schedule. Be advised that the call may come in anytime during the day, between the hours of 8:00 AM and 8::00 PM, depending on provider availability. We do understand that the system is not perfect. If you are unable to be available that day on a moments notice, then request an "in-person" appointment  rather than a "virtual visit". ? ?Can I request my medication visits to be "Virtual"? ?Yes you may request it, but the decision is entirely up to the healthcare provider. Control substances require specifi

## 2021-07-30 NOTE — Progress Notes (Signed)
PROVIDER NOTE: Interpretation of information contained herein should be left to medically-trained personnel. Specific patient instructions are provided elsewhere under "Patient Instructions" section of medical record. This document was created in part using STT-dictation technology, any transcriptional errors that may result from this process are unintentional.  ?Patient: BRILEY BUMGARNER ?Type: Established ?DOB: January 08, 1961 ?MRN: [id number] ?PCP: Sharyne Peach, MD  Service: Procedure ?DOS: 07/30/2021 ?Setting: Ambulatory ?Location: Ambulatory outpatient facility ?Delivery: Face-to-face Provider: Gaspar Cola, MD ?Specialty: Interventional Pain Management ?Specialty designation: 09 ?Location: Outpatient facility ?Ref. Prov.: Sharyne Peach, MD   ?Procedure [facility] Interventional Pain Management )  ? Type: Cervical Epidural Steroid injection (ESI) (Interlaminar) #2  ?Laterality: Left  ?Level: C7-T1 ?Imaging: Fluoroscopy-assisted ?DOS: 07/30/2021  ?Performed by: Milinda Pointer, MD ?Anesthesia: Local anesthesia (1-2% Lidocaine) ?Anxiolysis: None                 ?Sedation: None.  ? ?Purpose: Diagnostic/Therapeutic ?Indications: Cervicalgia, cervical radicular pain, degenerative disc disease, severe enough to impact quality of life or function. ?1. 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, C5-6, C6-7)   ?2. Cervical facet arthropathy (C3-4, C4-5, C5-6)   ?3. Cervical Retrolisthesis (1.5 mm) of C4/C5   ?4. Cervical spondylosis with radiculopathy   ?5. Chronic neck pain (5th area of Pain) (Bilateral) (R>L)   ?6. Chronic shoulder pain (1ry area of Pain) (Bilateral) (R>L)   ?7. Chronic upper extremity pain (7th area of Pain) (Bilateral) (R>L)   ?8. DDD (degenerative disc disease), cervical   ? At high risk for allergic reaction to latex   ? Latex precautions, history of latex allergy   ? History of allergy to latex   ? ?NAS-11 score:  ? Pre-procedure: 0-No pain/10  ? Post-procedure: 9 /10  ? ?Initially the patient  was scheduled to come in for a right-sided cervical epidural steroid injection, but upon arrival today she indicates that it is her left upper extremity that is hurting.  She is having pain going all the way from the neck to her hand. ?  ? ?Pre-Procedure Preparation  ?Monitoring: As per clinic protocol. Respiration, ETCO2, SpO2, BP, heart rate and rhythm monitor placed and checked for adequate function  ?Risk Assessment: ?Vitals:  HEN:IDPOEUMPN body mass index is 27.4 kg/m? as calculated from the following: ?  Height as of this encounter: 5' 1"  (1.549 m). ?  Weight as of this encounter: 145 lb (65.8 kg)., Rate:(!) 104 , BP:(!) 166/91, Resp:16, Temp:98 ?F (36.7 ?C), SpO2:98 %  ?Allergies: She is allergic to naproxen, penicillins, gluten meal, lactose intolerance (gi), latex, soy allergy, sulfonylureas, doxycycline, humira [adalimumab], other, simvastatin, statins, sulfa antibiotics, sulfasalazine, and vancomycin.  ?Precautions: None required  ?Blood-thinner(s): None at this time  ?Coagulopathies: Reviewed. None identified.   ?Active Infection(s): Reviewed. None identified. Ms. Pruitt is afebrile  ? ?Location setting: Procedure suite ?Position: Prone, on modified reverse trendelenburg to facilitate breathing, with head in head-cradle. Pillows positioned under chest (below chin-level) with cervical spine flexed. ?Safety Precautions: Patient was assessed for positional comfort and pressure points before starting the procedure. ?Prepping solution: DuraPrep (Iodine Povacrylex [0.7% available iodine] and Isopropyl Alcohol, 74% w/w) ?Prep Area: Entire  cervicothoracic region ?Approach: percutaneous, paramedial ?Intended target: Posterior cervical epidural space ?Materials: ?Tray: Epidural ?Needle(s): Epidural (Tuohy) ?Qty: 1 ?Length: (71m) 3.5-inch ?Gauge: 17G  ? ?Meds ordered this encounter  ?Medications  ? iohexol (OMNIPAQUE) 180 MG/ML injection 10 mL  ?  Must be Myelogram-compatible. If not available, you may  substitute with a water-soluble, non-ionic, hypoallergenic, myelogram-compatible radiological contrast  medium.  ? lidocaine (XYLOCAINE) 2 % (with pres) injection 400 mg  ? pentafluoroprop-tetrafluoroeth (GEBAUERS) aerosol  ? DISCONTD: lactated ringers infusion 1,000 mL  ? DISCONTD: midazolam (VERSED) 5 MG/5ML injection 0.5-2 mg  ?  Make sure Flumazenil is available in the pyxis when using this medication. If oversedation occurs, administer 0.2 mg IV over 15 sec. If after 45 sec no response, administer 0.2 mg again over 1 min; may repeat at 1 min intervals; not to exceed 4 doses (1 mg)  ? sodium chloride flush (NS) 0.9 % injection 1 mL  ? ropivacaine (PF) 2 mg/mL (0.2%) (NAROPIN) injection 1 mL  ? dexamethasone (DECADRON) injection 10 mg  ?  Orders Placed This Encounter  ?Procedures  ? Cervical Epidural Injection  ?  Indication(s): Radiculitis and cervicalgia associated with cervical degenerative disc disease. ?Position: Prone ?Imaging guidance: Fluoroscopy required. Contrast required unless contraindicated by allergy or severe CKD. ?Equipment & Materials: Epidural tray & needle.  ?  Scheduling Instructions:  ?   Procedure: Cervical Epidural Steroid Injection/Block  ?   Planned Level(s): C7-T1  ?   Laterality: Left-sided  ?   Anxiolysis: Patient's choice.  ?   Timeframe: Today  ?  Order Specific Question:   Where will this procedure be performed?  ?  Answer:   [facility]  ?  Comments:   by Dr. Dossie Arbour  ? [place] C-ARM 1-60 MIN NO REPORT  ?  Intraoperative interpretation by procedural physician at [facility].  ?  Standing Status:   Standing  ?  Number of Occurrences:   1  ?  Order Specific Question:   Reason for exam:  ?  Answer:   Assistance in needle guidance and placement for procedures requiring needle placement in or near specific anatomical locations not easily accessible without such assistance.  ? Informed Consent Details: Physician/Practitioner Attestation; Transcribe to consent  form and obtain patient signature  ?  Nursing instructions: Transcribe to consent form and obtain patient signature. Always confirm laterality of pain with Ms. Rascon, before procedure.  ?  Order Specific Question:   Physician/Practitioner attestation of informed consent for procedure/surgical case  ?  Answer:   I, the physician/practitioner, attest that I have discussed with the patient the benefits, risks, side effects, alternatives, likelihood of achieving goals and potential problems during recovery for the procedure that I have provided informed consent.  ?  Order Specific Question:   Procedure  ?  Answer:   Cervical Epidural Steroid Injection (CESI) under fluoroscopic guidance  ?  Order Specific Question:   Physician/Practitioner performing the procedure  ?  Answer:   Fujiko Picazo A. Dossie Arbour MD  ?  Order Specific Question:   Indication/Reason  ?  Answer:   Indications: Cervicalgia (neck pain), cervical radicular pain, radiculitis (arm/shoulder pain, numbness, and/or weakness), degenerative disc disease, severe enough to greatly impact quality of life or function.  ? Provide equipment / supplies at bedside  ?  "Epidural Tray" (Disposable  single use) ?Catheter: NOT required  ?  Standing Status:   Standing  ?  Number of Occurrences:   1  ?  Order Specific Question:   Specify  ?  Answer:   Epidural Tray  ? Latex precautions  ?  Activate Latex-Free Protocol.  ?  Standing Status:   Standing  ?  Number of Occurrences:   1  ?  ? ?Time-out: 1000 I initiated and conducted the "Time-out" before starting the procedure, as per protocol. The patient was  asked to participate by confirming the accuracy of the "Time Out" information. Verification of the correct person, site, and procedure were performed and confirmed by me, the nursing staff, and the patient. "Time-out" conducted as per Joint Commission's Universal Protocol ([id number].01.01.01). ?Procedure checklist: Completed  ? ?H&P (Pre-op  Assessment)  ?Ms. Scearce is a 61 y.o.  (year old), female patient, seen today for interventional treatment. She  has a past surgical history that includes Abdominal hysterectomy; rectal fistula packing; Ileostomy (Bilateral); Hernia repair; Cholecyst

## 2021-07-31 ENCOUNTER — Telehealth: Payer: Self-pay

## 2021-07-31 NOTE — Telephone Encounter (Signed)
Called pp.Denies any needs at this time. Instructed to call if needed. ?

## 2021-08-18 NOTE — Progress Notes (Signed)
Patient: Caroline Hughes  Service Category: E/M  Provider: Gaspar Cola, MD  ?DOB: 12-18-1960  DOS: 08/20/2021  Location: Office  ?MRN: [id number]  Setting: Ambulatory outpatient  Referring Provider: Sharyne Peach, MD  ?Type: Established Patient  Specialty: Interventional Pain Management  PCP: Sharyne Peach, MD  ?Location: Remote location  Delivery: TeleHealth    ? ?Virtual Encounter - Pain Management ?PROVIDER NOTE: Information contained herein reflects review and annotations entered in association with encounter. Interpretation of such information and data should be left to medically-trained personnel. Information provided to patient can be located elsewhere in the medical record under "Patient Instructions". Document created using STT-dictation technology, any transcriptional errors that may result from process are unintentional.  ?  ?Contact & Pharmacy ?Preferred: [phone] ?Home: [phone] (home) ?Mobile: [phone] (mobile) ?E-mail: [email or web address]  ?[facility] [address] ?[address] ?[place] [address] ?Phone: [phone] Fax: [phone] ?  ?Pre-screening  ?Caroline Hughes offered "in-person" vs "virtual" encounter. She indicated preferring virtual for this encounter.  ? ?Reason ?COVID-19*  Social distancing based on CDC and AMA recommendations.  ? ?I contacted Caroline Hughes on 08/20/2021 via telephone.      I clearly identified myself as Gaspar Cola, MD. I verified that I was speaking with the correct person using two identifiers (Name: Caroline Hughes, and date of birth: 11/09/1960). ? ?Consent ?I sought verbal advanced consent from Caroline Hughes for virtual visit interactions. I informed Caroline Hughes of possible security and privacy concerns, risks, and limitations associated with providing "not-in-person" medical evaluation and management services. I also informed Caroline Hughes of the availability of "in-person" appointments.  Finally, I informed her that there would be a charge for the virtual visit and that she could be  personally, fully or partially, financially responsible for it. Ms. Thackston expressed understanding and agreed to proceed.  ? ?Historic Elements   ?Caroline Hughes is a 61 y.o. year old, female patient evaluated today after our last contact on 07/30/2021. Caroline Hughes  has a past medical history of Anemia, Anxiety, Arthritis, Asthma, Bronchitis (4680), Complication of anesthesia, Crohn's disease ([place]), Depression, Diabetes mellitus without complication ([place]), Endometriosis, Fibromyalgia, Gastric ulcer, GERD (gastroesophageal reflux disease), Herniated disc, cervical, Hyperlipemia, Intestinal obstruction ([place]), Migraine, Nausea, Neuropathy, Pain, Peristomal hernia, PONV (postoperative nausea and vomiting), and Ulcerative colitis ([place]). She also  has a past surgical history that includes Abdominal hysterectomy; rectal fistula packing; Ileostomy (Bilateral); Hernia repair; Cholecystectomy; Cataract extraction w/PHACO (Right, 11/25/2016); Colon resection; Cataract extraction w/PHACO (Left, 12/02/2016); Esophagogastroduodenoscopy (egd) with propofol (N/A, 06/17/2017); Ileoscopy (N/A, 06/17/2017); Esophagogastroduodenoscopy (egd) with propofol (N/A, 06/19/2017); Ileoscopy (N/A, 06/23/2017); and Esophagogastroduodenoscopy (egd) with propofol (N/A, 01/26/2019). Caroline Hughes has a current medication list which includes the following prescription(s): advair hfa, ajovy, albuterol, albuterol, alprazolam, baclofen, cimzia prefilled, cimzia prefilled, clindamycin, desipramine, dicyclomine, famotidine, fexofenadine, advair hfa, folic acid, ajovy, precision qid test, hydroxyzine, insulin detemir, ultiguard safepack pen needle, levofloxacin, lorazepam, methotrexate, montelukast, nortriptyline, ondansetron, pantoprazole, semaglutide(0.25 or 0.83m/dos), sucralfate, sumatriptan, tizanidine, tramadol, ulticare micro pen needles, and  escitalopram. She  reports that she has quit smoking. She has never used smokeless tobacco. She reports current alcohol use of about 3.0 standard drinks per week. She reports that she does not use drugs. Caroline Hughes allergic to naproxen, penicillins, gluten meal, lactose intolerance (gi), latex, soy allergy, sulfonylureas, doxycycline, humira [adalimumab], other, simvastatin, statins, sulfa antibiotics, sulfasalazine, and vancomycin.  ? ?HPI  ?Today, she  is being contacted for a post-procedure assessment.  According to the patient she attained 100% relief of the pain for approximately 4 days after which the pain started coming back.  She still having symptoms in the left upper extremity and because of this we have decided to go ahead and complete a series of 3 cervical epidural steroid injections.  In addition, today while I was reviewing her chart I noticed that she does not have any recent MRIs of the cervical spine and because this pain continues to come back I like to order one to evaluate the area and see whether or not she may possibly need decompressive surgery.  The plan was shared with the patient who understood and accepted.  The plan is to try to have the MRI done before doing the third and last cervical epidural. ? ?Post-procedure evaluation  ? Type: Cervical Epidural Steroid injection (ESI) (Interlaminar) #2  ?Laterality: Left  ?Level: C7-T1 ?Imaging: Fluoroscopy-assisted ?DOS: 07/30/2021  ?Performed by: Caroline Pointer, MD ?Anesthesia: Local anesthesia (1-2% Lidocaine) ?Anxiolysis: None                 ?Sedation: None.  ? ?Purpose: Diagnostic/Therapeutic ?Indications: Cervicalgia, cervical radicular pain, degenerative disc disease, severe enough to impact quality of life or function. ?1. 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, C5-6, C6-7)   ?2. Cervical facet arthropathy (C3-4, C4-5, C5-6)   ?3. Cervical Retrolisthesis (1.5 mm) of C4/C5   ?4. Cervical spondylosis with radiculopathy   ?5. Chronic  neck pain (5th area of Pain) (Bilateral) (R>L)   ?6. Chronic shoulder pain (1ry area of Pain) (Bilateral) (R>L)   ?7. Chronic upper extremity pain (7th area of Pain) (Bilateral) (R>L)   ?8. DDD (degenerative disc disease), cervical   ? At high risk for allergic reaction to latex   ? Latex precautions, history of latex allergy   ? History of allergy to latex   ? ?NAS-11 score:  ? Pre-procedure: 0-No pain/10  ? Post-procedure: 9 /10  ? ?Initially the patient was scheduled to come in for a right-sided cervical epidural steroid injection, but upon arrival today she indicates that it is her left upper extremity that is hurting.  She is having pain going all the way from the neck to her hand. ?   ?Effectiveness:  ?Initial hour after procedure: 100 %. ?Subsequent 4-6 hours post-procedure: 100 %. ?Analgesia past initial 6 hours: 100 % (lasting 4 days then gradually returned). ?Ongoing improvement:  ?Analgesic: The patient refers that the pain is somewhat better, but she still having the symptoms down the left arm. ?Function: Somewhat improved ?ROM: Somewhat improved ? ?Pharmacotherapy Assessment  ? ?Opioid Analgesic: None ?MME/day: 0 mg/day  ? ?Monitoring: ?[place] PMP: PDMP reviewed during this encounter.       ?Pharmacotherapy: No side-effects or adverse reactions reported. ?Compliance: No problems identified. ?Effectiveness: Clinically acceptable. ?Plan: Refer to "POC". UDS:  ?Summary  ?Date Value Ref Range Status  ?05/22/2021 Note  Final  ?  Comment:  ?  ==================================================================== ?Compliance Drug Analysis, Ur ?==================================================================== ?Test                             Result       Flag       Units ? ?Drug Present and Declared for Prescription Verification ?  Tramadol                       [id number]  EXPECTED   ng/mg creat ?  O-Desmethyltramadol            >5882        EXPECTED   ng/mg creat ?  N-Desmethyltramadol            >5882         EXPECTED   ng/mg creat ?   Source of tramadol is a prescription medication. O-desmethyltramadol ?   and N-desmethyltramadol are expected metabolites of tramadol. ? ?  Tizanidine                     PRESENT      EXPECT

## 2021-08-20 ENCOUNTER — Ambulatory Visit: Payer: Medicare Other | Attending: Pain Medicine | Admitting: Pain Medicine

## 2021-08-20 DIAGNOSIS — M5030 Other cervical disc degeneration, unspecified cervical region: Secondary | ICD-10-CM

## 2021-08-20 DIAGNOSIS — M542 Cervicalgia: Secondary | ICD-10-CM | POA: Diagnosis not present

## 2021-08-20 DIAGNOSIS — M79601 Pain in right arm: Secondary | ICD-10-CM | POA: Diagnosis not present

## 2021-08-20 DIAGNOSIS — M25511 Pain in right shoulder: Secondary | ICD-10-CM

## 2021-08-20 DIAGNOSIS — M47812 Spondylosis without myelopathy or radiculopathy, cervical region: Secondary | ICD-10-CM

## 2021-08-20 DIAGNOSIS — M25512 Pain in left shoulder: Secondary | ICD-10-CM

## 2021-08-20 DIAGNOSIS — M5412 Radiculopathy, cervical region: Secondary | ICD-10-CM | POA: Diagnosis not present

## 2021-08-20 DIAGNOSIS — G8929 Other chronic pain: Secondary | ICD-10-CM

## 2021-08-20 DIAGNOSIS — M4310 Spondylolisthesis, site unspecified: Secondary | ICD-10-CM

## 2021-08-20 DIAGNOSIS — M4802 Spinal stenosis, cervical region: Secondary | ICD-10-CM

## 2021-08-20 DIAGNOSIS — M79602 Pain in left arm: Secondary | ICD-10-CM

## 2021-08-20 DIAGNOSIS — M4722 Other spondylosis with radiculopathy, cervical region: Secondary | ICD-10-CM

## 2021-08-20 NOTE — Patient Instructions (Signed)
______________________________________________________________________ ? ?Preparing for Procedure with Sedation ? ?NOTICE: Due to recent regulatory changes, starting on November 19, 2020, procedures requiring intravenous (IV) sedation will no longer be performed at the [facility].  These types of procedures are required to be performed at [facility] ambulatory surgery facility.  We are very sorry for the inconvenience. ? ?Procedure appointments are limited to planned procedures: ?No Prescription Refills. ?No disability issues will be discussed. ?No medication changes will be discussed. ? ?Instructions: ?Oral Intake: Do not eat or drink anything for at least 8 hours prior to your procedure. (Exception: Blood Pressure Medication. See below.) ?Transportation: A driver is required. You may not drive yourself after the procedure. ?Blood Pressure Medicine: Do not forget to take your blood pressure medicine with a sip of water the morning of the procedure. If your Diastolic (lower reading) is above 100 mmHg, elective cases will be cancelled/rescheduled. ?Blood thinners: These will need to be stopped for procedures. Notify our staff if you are taking any blood thinners. Depending on which one you take, there will be specific instructions on how and when to stop it. ?Diabetics on insulin: Notify the staff so that you can be scheduled 1st case in the morning. If your diabetes requires high dose insulin, take only ? of your normal insulin dose the morning of the procedure and notify the staff that you have done so. ?Preventing infections: Shower with an antibacterial soap the morning of your procedure. ?Build-up your immune system: Take 1000 mg of Vitamin C with every meal (3 times a day) the day prior to your procedure. ?Antibiotics: Inform the staff if you have a condition or reason that requires you to take antibiotics before dental procedures. ?Pregnancy: If you are pregnant, call and cancel the procedure. ?Sickness: If  you have a cold, fever, or any active infections, call and cancel the procedure. ?Arrival: You must be in the facility at least 30 minutes prior to your scheduled procedure. ?Children: Do not bring children with you. ?Dress appropriately: There is always the possibility that your clothing may get soiled. ?Valuables: Do not bring any jewelry or valuables. ? ?Reasons to call and reschedule or cancel your procedure: (Following these recommendations will minimize the risk of a serious complication.) ?Surgeries: Avoid having procedures within 2 weeks of any surgery. (Avoid for 2 weeks before or after any surgery). ?Flu Shots: Avoid having procedures within 2 weeks of a flu shots. (Avoid for 2 weeks before or after immunizations). ?Barium: Avoid having a procedure within 7-10 days after having had a radiological study involving the use of radiological contrast. (Myelograms, Barium swallow or enema study). ?Heart attacks: Avoid any elective procedures or surgeries for the initial 6 months after a "Myocardial Infarction" (Heart Attack). ?Blood thinners: It is imperative that you stop these medications before procedures. Let us know if you if you take any blood thinner.  ?Infection: Avoid procedures during or within two weeks of an infection (including chest colds or gastrointestinal problems). Symptoms associated with infections include: Localized redness, fever, chills, night sweats or profuse sweating, burning sensation when voiding, cough, congestion, stuffiness, runny nose, sore throat, diarrhea, nausea, vomiting, cold or Flu symptoms, recent or current infections. It is specially important if the infection is over the area that we intend to treat. ?Heart and lung problems: Symptoms that may suggest an active cardiopulmonary problem include: cough, chest pain, breathing difficulties or shortness of breath, dizziness, ankle swelling, uncontrolled high or unusually low blood pressure, and/or palpitations. If you are  experiencing any of these symptoms, cancel your procedure and contact your primary care physician for an evaluation. ? ?Remember:  ?Regular Business hours are:  ?Monday to Thursday 8:00 AM to 4:00 PM ? ?Provider's Schedule: ?Milinda Pointer, MD:  ?Procedure days: Tuesday and Thursday 7:30 AM to 4:00 PM ? ?Gillis Santa, MD:  ?Procedure days: Monday and Wednesday 7:30 AM to 4:00 PM ?______________________________________________________________________ ? ____________________________________________________________________________________________ ? ?General Risks and Possible Complications ? ?Patient Responsibilities: It is important that you read this as it is part of your informed consent. It is our duty to inform you of the risks and possible complications associated with treatments offered to you. It is your responsibility as a patient to read this and to ask questions about anything that is not clear or that you believe was not covered in this document. ? ?Patient?s Rights: You have the right to refuse treatment. You also have the right to change your mind, even after initially having agreed to have the treatment done. However, under this last option, if you wait until the last second to change your mind, you may be charged for the materials used up to that point. ? ?Introduction: Medicine is not an Chief Strategy Officer. Everything in Medicine, including the lack of treatment(s), carries the potential for danger, harm, or loss (which is by definition: Risk). In Medicine, a complication is a secondary problem, condition, or disease that can aggravate an already existing one. All treatments carry the risk of possible complications. The fact that a side effects or complications occurs, does not imply that the treatment was conducted incorrectly. It must be clearly understood that these can happen even when everything is done following the highest safety standards. ? ?No treatment: You can choose not to proceed with the  proposed treatment alternative. The ?PRO(s)? would include: avoiding the risk of complications associated with the therapy. The ?CON(s)? would include: not getting any of the treatment benefits. These benefits fall under one of three categories: diagnostic; therapeutic; and/or palliative. Diagnostic benefits include: getting information which can ultimately lead to improvement of the disease or symptom(s). Therapeutic benefits are those associated with the successful treatment of the disease. Finally, palliative benefits are those related to the decrease of the primary symptoms, without necessarily curing the condition (example: decreasing the pain from a flare-up of a chronic condition, such as incurable terminal cancer). ? ?General Risks and Complications: These are associated to most interventional treatments. They can occur alone, or in combination. They fall under one of the following six (6) categories: no benefit or worsening of symptoms; bleeding; infection; nerve damage; allergic reactions; and/or death. ?No benefits or worsening of symptoms: In Medicine there are no guarantees, only probabilities. No healthcare provider can ever guarantee that a medical treatment will work, they can only state the probability that it may. Furthermore, there is always the possibility that the condition may worsen, either directly, or indirectly, as a consequence of the treatment. ?Bleeding: This is more common if the patient is taking a blood thinner, either prescription or over the counter (example: Goody Powders, Fish oil, Aspirin, Garlic, etc.), or if suffering a condition associated with impaired coagulation (example: Hemophilia, cirrhosis of the liver, low platelet counts, etc.). However, even if you do not have one on these, it can still happen. If you have any of these conditions, or take one of these drugs, make sure to notify your treating physician. ?Infection: This is more common in patients with a compromised  immune system, either due to disease (example:  diabetes, cancer, human immunodeficiency virus [HIV], etc.), or due to medications or treatments (example: therapies used to treat cancer and rheumatological dise

## 2021-09-02 ENCOUNTER — Telehealth: Payer: Self-pay | Admitting: Pain Medicine

## 2021-09-02 NOTE — Telephone Encounter (Signed)
Pt stated that she is having a MRI done on the left arm, but she has some numbness/ tingling on the right side now. Pt wanted FN to know. Please call Pt with some advice. ?

## 2021-09-04 ENCOUNTER — Ambulatory Visit: Payer: Medicare Other

## 2021-09-13 ENCOUNTER — Ambulatory Visit
Admission: RE | Admit: 2021-09-13 | Discharge: 2021-09-13 | Disposition: A | Discharge status: Home / Self Care | Payer: Medicare Other | Source: Ambulatory Visit | Attending: Pain Medicine | Admitting: Pain Medicine

## 2021-09-13 DIAGNOSIS — M25512 Pain in left shoulder: Secondary | ICD-10-CM | POA: Diagnosis present

## 2021-09-13 DIAGNOSIS — M542 Cervicalgia: Secondary | ICD-10-CM | POA: Diagnosis present

## 2021-09-13 DIAGNOSIS — M25511 Pain in right shoulder: Secondary | ICD-10-CM | POA: Insufficient documentation

## 2021-09-13 DIAGNOSIS — M4722 Other spondylosis with radiculopathy, cervical region: Secondary | ICD-10-CM | POA: Insufficient documentation

## 2021-09-13 DIAGNOSIS — M4310 Spondylolisthesis, site unspecified: Secondary | ICD-10-CM | POA: Insufficient documentation

## 2021-09-13 DIAGNOSIS — M79602 Pain in left arm: Secondary | ICD-10-CM | POA: Diagnosis present

## 2021-09-13 DIAGNOSIS — M47812 Spondylosis without myelopathy or radiculopathy, cervical region: Secondary | ICD-10-CM | POA: Diagnosis present

## 2021-09-13 DIAGNOSIS — M5412 Radiculopathy, cervical region: Secondary | ICD-10-CM | POA: Diagnosis present

## 2021-09-13 DIAGNOSIS — G8929 Other chronic pain: Secondary | ICD-10-CM | POA: Insufficient documentation

## 2021-09-13 DIAGNOSIS — M79601 Pain in right arm: Secondary | ICD-10-CM | POA: Diagnosis present

## 2021-09-13 DIAGNOSIS — M4802 Spinal stenosis, cervical region: Secondary | ICD-10-CM | POA: Insufficient documentation

## 2021-09-13 DIAGNOSIS — M5030 Other cervical disc degeneration, unspecified cervical region: Secondary | ICD-10-CM | POA: Diagnosis present

## 2021-09-24 ENCOUNTER — Ambulatory Visit: Payer: Medicare Other | Admitting: Pain Medicine

## 2021-10-01 ENCOUNTER — Ambulatory Visit: Payer: Medicare Other | Attending: Pain Medicine | Admitting: Pain Medicine

## 2021-10-01 ENCOUNTER — Ambulatory Visit
Admission: RE | Admit: 2021-10-01 | Discharge: 2021-10-01 | Disposition: A | Discharge status: Home / Self Care | Payer: Medicare Other | Source: Ambulatory Visit | Attending: Pain Medicine | Admitting: Pain Medicine

## 2021-10-01 ENCOUNTER — Encounter: Payer: Self-pay | Admitting: Pain Medicine

## 2021-10-01 VITALS — BP 160/104 | HR 97 | Temp 97.0°F | Resp 17 | Ht 61.0 in | Wt 144.0 lb

## 2021-10-01 DIAGNOSIS — R519 Headache, unspecified: Secondary | ICD-10-CM | POA: Diagnosis present

## 2021-10-01 DIAGNOSIS — M79602 Pain in left arm: Secondary | ICD-10-CM | POA: Insufficient documentation

## 2021-10-01 DIAGNOSIS — M5412 Radiculopathy, cervical region: Secondary | ICD-10-CM | POA: Insufficient documentation

## 2021-10-01 DIAGNOSIS — Z91040 Latex allergy status: Secondary | ICD-10-CM | POA: Insufficient documentation

## 2021-10-01 DIAGNOSIS — M79601 Pain in right arm: Secondary | ICD-10-CM | POA: Diagnosis present

## 2021-10-01 DIAGNOSIS — M4312 Spondylolisthesis, cervical region: Secondary | ICD-10-CM | POA: Insufficient documentation

## 2021-10-01 DIAGNOSIS — R202 Paresthesia of skin: Secondary | ICD-10-CM | POA: Diagnosis present

## 2021-10-01 DIAGNOSIS — M542 Cervicalgia: Secondary | ICD-10-CM | POA: Insufficient documentation

## 2021-10-01 DIAGNOSIS — M4802 Spinal stenosis, cervical region: Secondary | ICD-10-CM | POA: Diagnosis present

## 2021-10-01 DIAGNOSIS — M4722 Other spondylosis with radiculopathy, cervical region: Secondary | ICD-10-CM | POA: Insufficient documentation

## 2021-10-01 DIAGNOSIS — G8929 Other chronic pain: Secondary | ICD-10-CM | POA: Insufficient documentation

## 2021-10-01 DIAGNOSIS — Z9189 Other specified personal risk factors, not elsewhere classified: Secondary | ICD-10-CM | POA: Diagnosis present

## 2021-10-01 DIAGNOSIS — R200 Anesthesia of skin: Secondary | ICD-10-CM | POA: Insufficient documentation

## 2021-10-01 DIAGNOSIS — M4310 Spondylolisthesis, site unspecified: Secondary | ICD-10-CM | POA: Insufficient documentation

## 2021-10-01 DIAGNOSIS — M5030 Other cervical disc degeneration, unspecified cervical region: Secondary | ICD-10-CM | POA: Insufficient documentation

## 2021-10-01 MED ORDER — LACTATED RINGERS IV SOLN: Freq: Once | INTRAVENOUS | Status: DC

## 2021-10-01 MED ORDER — MIDAZOLAM HCL 5 MG/5ML IJ SOLN: 0.5000 mg | Freq: Once | INTRAMUSCULAR | Status: DC

## 2021-10-01 MED ORDER — SODIUM CHLORIDE 0.9% FLUSH
1.0000 mL | Freq: Once | INTRAVENOUS | Status: AC
  Administered 2021-10-01: 10 mL

## 2021-10-01 MED ORDER — LIDOCAINE HCL 2 % IJ SOLN
20.0000 mL | Freq: Once | INTRAMUSCULAR | Status: AC
  Administered 2021-10-01: 400 mg
  Filled 2021-10-01: qty 20

## 2021-10-01 MED ORDER — PENTAFLUOROPROP-TETRAFLUOROETH EX AERO: INHALATION_SPRAY | Freq: Once | CUTANEOUS | Status: DC

## 2021-10-01 MED ORDER — DEXAMETHASONE SODIUM PHOSPHATE 10 MG/ML IJ SOLN
10.0000 mg | Freq: Once | INTRAMUSCULAR | Status: AC
  Administered 2021-10-01: 10 mg
  Filled 2021-10-01: qty 1

## 2021-10-01 MED ORDER — ROPIVACAINE HCL 2 MG/ML IJ SOLN
1.0000 mL | Freq: Once | INTRAMUSCULAR | Status: AC
  Administered 2021-10-01: 20 mL via EPIDURAL
  Filled 2021-10-01: qty 20

## 2021-10-01 MED ORDER — IOHEXOL 180 MG/ML  SOLN
10.0000 mL | Freq: Once | INTRAMUSCULAR | Status: AC
  Administered 2021-10-01: 10 mL via EPIDURAL
  Filled 2021-10-01: qty 20

## 2021-10-01 NOTE — Progress Notes (Signed)
PROVIDER NOTE: Interpretation of information contained herein should be left to medically-trained personnel. Specific patient instructions are provided elsewhere under "Patient Instructions" section of medical record. This document was created in part using STT-dictation technology, any transcriptional errors that may result from this process are unintentional.  Patient: Caroline Hughes Type: Established DOB: 1960/05/01 MRN: [id number] PCP: Sharyne Peach, MD  Service: Procedure DOS: 10/01/2021 Setting: Ambulatory Location: Ambulatory outpatient facility Delivery: Face-to-face Provider: Gaspar Cola, MD Specialty: Interventional Pain Management Specialty designation: 09 Location: Outpatient facility Ref. Prov.: Sharyne Peach, MD   Procedure [facility] Interventional Pain Management )   Type: Cervical Epidural Steroid injection (ESI) (Interlaminar) #3  Laterality: Right  Level: C7-T1 Imaging: Fluoroscopy-assisted DOS: 10/01/2021  Performed by: Milinda Pointer, MD Anesthesia: Local anesthesia (1-2% Lidocaine) Anxiolysis: None                 Sedation: None.   Purpose: Diagnostic/Therapeutic Indications: Cervicalgia, cervical radicular pain, degenerative disc disease, severe enough to impact quality of life or function. 1. Cervical radiculopathy (Right)   2. Chronic pain of both upper extremities   3. Chronic upper extremity numbness and tingling (Right)   4. 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, C5-6) (Left: C3-4)   6. Cervical Retrolisthesis (1.5 mm) of C4/C5   7. Cervical spondylosis with radiculopathy   8. Grade 1 (12m) Anterolisthesis of cervical spine (C3/C4)   9. Chronic neck pain (5th area of Pain) (Bilateral) (R>L)   10. Chronic occipital headache (6th area of Pain) (Bilateral) (R>L)   11. Chronic upper extremity pain (7th area of Pain) (Bilateral) (R>L)   12. DDD (degenerative disc disease), cervical    13. At high risk for allergic reaction to latex   14. History of allergy to latex   15. Latex precautions, history of latex allergy    NAS-11 score:   Pre-procedure: 2 /10   Post-procedure: 0-No pain/10   The patient was initially scheduled for a left-sided cervical epidural steroid injection, but upon review of her medical records and the patient's condition, she indicated that she is having radicular pain, numbness, and weakness over the right upper extremity and therefore she wants to have the procedure done on the right side.    Pre-Procedure Preparation  Monitoring: As per clinic protocol. Respiration, ETCO2, SpO2, BP, heart rate and rhythm monitor placed and checked for adequate function  Risk Assessment: Vitals:  BDUK:GURKYHCWCbody mass index is 27.21 kg/m as calculated from the following:   Height as of this encounter: 5' 1"  (1.549 m).   Weight as of this encounter: 144 lb (65.3 kg)., Rate:97ECG Heart Rate: (!) 103, BP:(!) 153/92, Resp:16, Temp:(!) 97 F (36.1 C), SpO2:100 %  Allergies: She is allergic to naproxen, penicillins, gluten meal, lactose intolerance (gi), latex, soy allergy, doxycycline, humira [adalimumab], other, simvastatin, statins, sulfa antibiotics, and vancomycin.  Precautions: None required  Blood-thinner(s): None at this time  Coagulopathies: Reviewed. None identified.   Active Infection(s): Reviewed. None identified. Ms. PLheureuxis afebrile   Location setting: Procedure suite Position: Prone, on modified reverse trendelenburg to facilitate breathing, with head in head-cradle. Pillows positioned under chest (below chin-level) with cervical spine flexed. Safety Precautions: Patient was assessed for positional comfort and pressure points before starting the procedure. Prepping solution: DuraPrep (Iodine Povacrylex [0.7% available iodine] and Isopropyl Alcohol, 74% w/w) Prep Area: Entire  cervicothoracic region Approach: percutaneous, paramedial Intended  target: Posterior cervical epidural space Materials: Tray: Epidural Needle(s): Epidural (  Tuohy) Qty: 1 Length: (38m) 3.5-inch Gauge: 17G   Meds ordered this encounter  Medications   iohexol (OMNIPAQUE) 180 MG/ML injection 10 mL    Must be Myelogram-compatible. If not available, you may substitute with a water-soluble, non-ionic, hypoallergenic, myelogram-compatible radiological contrast medium.   lidocaine (XYLOCAINE) 2 % (with pres) injection 400 mg   pentafluoroprop-tetrafluoroeth (GEBAUERS) aerosol   DISCONTD: lactated ringers infusion   DISCONTD: midazolam (VERSED) 5 MG/5ML injection 0.5-2 mg    Make sure Flumazenil is available in the pyxis when using this medication. If oversedation occurs, administer 0.2 mg IV over 15 sec. If after 45 sec no response, administer 0.2 mg again over 1 min; may repeat at 1 min intervals; not to exceed 4 doses (1 mg)   sodium chloride flush (NS) 0.9 % injection 1 mL   ropivacaine (PF) 2 mg/mL (0.2%) (NAROPIN) injection 1 mL   dexamethasone (DECADRON) injection 10 mg    Orders Placed This Encounter  Procedures   Cervical Epidural Injection    Indication(s): Radiculitis and cervicalgia associated with cervical degenerative disc disease. Position: Prone Imaging guidance: Fluoroscopy required. Contrast required unless contraindicated by allergy or severe CKD. Equipment & Materials: Epidural tray & needle.    Scheduling Instructions:     Procedure: Cervical Epidural Steroid Injection/Block     Planned Level(s): C7-T1     Laterality: Left-sided     Anxiolysis: Patient's choice.     Timeframe: Today    Order Specific Question:   Where will this procedure be performed?    Answer:   [facility]    Comments:   by [facility]. [facility] 1-60 MIN NO REPORT    [place]    Standing Status:   Standing    Number of Occurrences:   1    Order Specific Question:    Reason for exam:    Answer:   Assistance in needle guidance and placement for procedures requiring needle placement in or near specific anatomical locations not easily accessible without such assistance.   Informed Consent Details: Physician/Practitioner Attestation; Transcribe to consent form and obtain patient signature    Nursing instructions: Transcribe to consent form and obtain patient signature. Always confirm laterality of pain with Ms. Poynor, before procedure.    Order Specific Question:   Physician/Practitioner attestation of informed consent for procedure/surgical case    Answer:   I, the physician/practitioner, attest that I have discussed with the patient the benefits, risks, side effects, alternatives, likelihood of achieving goals and potential problems during recovery for the procedure that I have provided informed consent.    Order Specific Question:   Procedure    Answer:   Cervical Epidural Steroid Injection (CESI) under fluoroscopic guidance    Order Specific Question:   Physician/Practitioner performing the procedure    Answer:   Lettie Czarnecki A. NDossie ArbourMD    Order Specific Question:   Indication/Reason    Answer:   Indications: Cervicalgia (neck pain), cervical radicular pain, radiculitis (arm/shoulder pain, numbness, and/or weakness), degenerative disc disease, severe enough to greatly impact quality of life or function.   Provide equipment / supplies at bedside    "Epidural Tray" (Disposable  single use) Catheter: NOT required    Standing Status:   Standing    Number of Occurrences:   1    Order Specific Question:   Specify    Answer:   Epidural Tray   Latex precautions  Activate Latex-Free Protocol.    Standing Status:   Standing    Number of Occurrences:   1     Time-out: 1100 I initiated and conducted the "Time-out" before starting the procedure, as per protocol. The patient was asked to participate by confirming the accuracy of the "Time Out" information.  Verification of the correct person, site, and procedure were performed and confirmed by me, the nursing staff, and the patient. "Time-out" conducted as per Joint Commission's Universal Protocol (UP.01.01.01). Procedure checklist: Completed   H&P (Pre-op  Assessment)  Ms. Knopf is a 61 y.o. (year old), female patient, seen today for interventional treatment. She  has a past surgical history that includes Abdominal hysterectomy; rectal fistula packing; Ileostomy (Bilateral); Hernia repair; Cholecystectomy; Cataract extraction w/PHACO (Right, 11/25/2016); Colon resection; Cataract extraction w/PHACO (Left, 12/02/2016); Esophagogastroduodenoscopy (egd) with propofol (N/A, 06/17/2017); Ileoscopy (N/A, 06/17/2017); Esophagogastroduodenoscopy (egd) with propofol (N/A, 06/19/2017); Ileoscopy (N/A, 06/23/2017); and Esophagogastroduodenoscopy (egd) with propofol (N/A, 01/26/2019). Ms. Maines has a current medication list which includes the following prescription(s): advair hfa, ajovy, albuterol, albuterol, alprazolam, baclofen, cimzia prefilled, cimzia prefilled, clindamycin, desipramine, dicyclomine, famotidine, fexofenadine, advair hfa, folic acid, ajovy, precision qid test, hydroxyzine, insulin detemir, ultiguard safepack pen needle, levofloxacin, methotrexate, montelukast, nortriptyline, ondansetron, pantoprazole, semaglutide(0.25 or 0.56m/dos), sucralfate, sumatriptan, tizanidine, ulticare micro pen needles, escitalopram, lorazepam, and tramadol, and the following Facility-Administered Medications: pentafluoroprop-tetrafluoroeth. Her primarily concern today is the Neck Pain  She is allergic to naproxen, penicillins, gluten meal, lactose intolerance (gi), latex, soy allergy, doxycycline, humira [adalimumab], other, simvastatin, statins, sulfa antibiotics, and vancomycin.   Last encounter: My last encounter with her was on 09/02/2021. Pertinent problems: Ms. PDisbrowhas Cervical myofascial pain syndrome; Controlled type  2 diabetes mellitus with diabetic nephropathy, with long-term current use of insulin ([place]; Fibromyalgia; DDD (degenerative disc disease), cervical; Spondylosis of cervical region without myelopathy or radiculopathy; Chronic pain syndrome; Osteoarthritis; Peripheral polyneuropathy; Piriformis syndrome, right; SI (sacroiliac) joint dysfunction; Trigger middle finger of left hand; Acute non intractable tension-type headache; Arthritis of both hands; Chronic shoulder pain (1ry area of Pain) (Bilateral) (R>L); Chronic upper extremity pain and numbness (Right); Chronic low back pain (3ry area of Pain) (Bilateral) (L>R) w/o sciatica; Chronic neck pain (5th area of Pain) (Bilateral) (R>L); 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, C5-6) (Left: C3-4); 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, C5-6, C6-7); Cervical Retrolisthesis (1.5 mm) of C4/C5; Cervical facet arthropathy (C3-4, C4-5, C5-6); Abnormal MRI, shoulder (Bilateral) (07/25/2020); Abnormal EMG (electromyogram) (03/18/2021; 03/25/2021); Chronic vaginal pain (2ry area of Pain); Chronic anal or rectal pain (2ry area of Pain); Chronic abdominal pain (4th area of Pain); Chronic occipital headache (6th area of Pain) (Bilateral) (R>L); Cervical facet syndrome (Bilateral); Chronic upper extremity pain (7th area of Pain) (Bilateral) (R>L); Grade 1 (357m Anterolisthesis of cervical spine (C3/C4); Tendinopathy of rotator cuff (Right); Supraspinatus syndrome (Right); Subacromial bursitis of shoulder joint (Right); Subdeltoid bursitis (Right); Osteoarthritis of AC (acromioclavicular) joint (Right); Tendinopathy of rotator cuff (Left); Supraspinatus syndrome (Left); Osteoarthritis of AC (acromioclavicular) joint (Left); Subacromial bursitis of shoulder joint (Left); Subdeltoid bursitis (Left); Lumbar facet arthropathy; Lumbar facet syndrome; Pseudoarthrosis of lumbar spine (Right: L5-S1); Bertolotti's syndrome (Right: L5-S1); Cervical spondylosis with radiculopathy; Cervical  radiculopathy (Right); and Chronic upper extremity numbness and tingling (Right) on their pertinent problem list. Pain Assessment: Severity of Chronic pain is reported as a 2 /10. Location: Neck  /both shoulders and numbness down right arm. Onset: More than a month ago. Quality: Tingling, Numbness. Timing: Intermittent. Modifying factor(s): nothing. Vitals:  height is 5' 1"  (1.549  m) and weight is 144 lb (65.3 kg). Her temporal temperature is 97 F (36.1 C) (abnormal). Her blood pressure is 160/104 (abnormal) and her pulse is 97. Her respiration is 17 and oxygen saturation is 96%.   Reason for encounter: Interventional pain management therapy due pain of at least four (4) weeks in duration, with to failure to respond to and/or inability to tolerate more conservative care.   Site Confirmation: Ms. Mangen was asked to confirm the procedure and laterality before marking the site.  Consent: Before the procedure and under the influence of no sedative(s), amnesic(s), or anxiolytics, the patient was informed of the treatment options, risks and possible complications. To fulfill our ethical and legal obligations, as recommended by the American Medical Association's Code of Ethics, I have informed the patient of my clinical impression; the nature and purpose of the treatment or procedure; the risks, benefits, and possible complications of the intervention; the alternatives, including doing nothing; the risk(s) and benefit(s) of the alternative treatment(s) or procedure(s); and the risk(s) and benefit(s) of doing nothing. The patient was provided information about the general risks and possible complications associated with the procedure. These may include, but are not limited to: failure to achieve desired goals, infection, bleeding, organ or nerve damage, allergic reactions, paralysis, and death. In addition, the patient was informed of those risks and complications associated to Spine-related procedures, such  as failure to decrease pain; infection (i.e.: Meningitis, epidural or intraspinal abscess); bleeding (i.e.: epidural hematoma, subarachnoid hemorrhage, or any other type of intraspinal or peri-dural bleeding); organ or nerve damage (i.e.: Any type of peripheral nerve, nerve root, or spinal cord injury) with subsequent damage to sensory, motor, and/or autonomic systems, resulting in permanent pain, numbness, and/or weakness of one or several areas of the body; allergic reactions; (i.e.: anaphylactic reaction); and/or death. Furthermore, the patient was informed of those risks and complications associated with the medications. These include, but are not limited to: allergic reactions (i.e.: anaphylactic or anaphylactoid reaction(s)); adrenal axis suppression; blood sugar elevation that in diabetics may result in ketoacidosis or comma; water retention that in patients with history of congestive heart failure may result in shortness of breath, pulmonary edema, and decompensation with resultant heart failure; weight gain; swelling or edema; medication-induced neural toxicity; particulate matter embolism and blood vessel occlusion with resultant organ, and/or nervous system infarction; and/or aseptic necrosis of one or more joints. Finally, the patient was informed that Medicine is not an exact science; therefore, there is also the possibility of unforeseen or unpredictable risks and/or possible complications that may result in a catastrophic outcome. The patient indicated having understood very clearly. We have given the patient no guarantees and we have made no promises. Enough time was given to the patient to ask questions, all of which were answered to the patient's satisfaction. Ms. Quintin has indicated that she wanted to continue with the procedure. Attestation: I, the ordering provider, attest that I have discussed with the patient the benefits, risks, side-effects, alternatives, likelihood of achieving goals,  and potential problems during recovery for the procedure that I have provided informed consent.  Date  Time: 10/01/2021  9:53 AM   Prophylactic antibiotics  Anti-infectives (From admission, onward)    None      Indication(s): None identified   Description of procedure   Start Time: 1101 hrs  Local Anesthesia: Once the patient was positioned, prepped, and time-out was completed. The target area was identified located. The skin was marked with an approved surgical skin marker.  Once marked, the skin (epidermis, dermis, and hypodermis), and deeper tissues (fat, connective tissue and muscle) were infiltrated with a small amount of a short-acting local anesthetic, loaded on a 10cc syringe with a 25G, 1.5-in  Needle. An appropriate amount of time was allowed for local anesthetics to take effect before proceeding to the next step. Local Anesthetic: Lidocaine 1-2% The unused portion of the local anesthetic was discarded in the proper designated containers. Safety Precautions: Aspiration looking for blood return was conducted prior to all injections. At no point did I inject any substances, as a needle was being advanced. Before injecting, the patient was told to immediately notify me if she was experiencing any new onset of "ringing in the ears, or metallic taste in the mouth". No attempts were made at seeking any paresthesias. Safe injection practices and needle disposal techniques used. Medications properly checked for expiration dates. SDV (single dose vial) medications used. After the completion of the procedure, all disposable equipment used was discarded in the proper designated medical waste containers.  Technical description: Protocol guidelines were followed. Using fluoroscopic guidance, the epidural needle was introduced through the skin, ipsilateral to the reported pain, and advanced to the target area. Posterior laminar os was contacted and the needle walked caudad, until the lamina was  cleared. The ligamentum flavum was engaged and the epidural space identified using "loss-of-resistance technique" with 2-3 ml of PF-NaCl (0.9% NSS), in a 5cc dedicated LOR syringe. See "Imaging guidance" below for use of contrast details.  Injection: Once satisfactory needle placement was confirmed, I proceeded to inject the desired solution in slow, incremental fashion, intermittently assessing for discomfort or any signs of abnormal or undesired spread of substance. Once completed, the needle was removed and disposed of, as per hospital protocols.   Vitals:   10/01/21 0952 10/01/21 1101 10/01/21 1106  BP: (!) 153/92 (!) 174/93 (!) 160/104  Pulse: 97    Resp: 16 14 17   Temp: (!) 97 F (36.1 C)    TempSrc: Temporal    SpO2: 100% 98% 96%  Weight: 144 lb (65.3 kg)    Height: 5' 1"  (1.549 m)      End Time: 1105 hrs  Once the entire procedure was completed, the treated area was cleaned, making sure to leave some of the prepping solution back to take advantage of its long term bactericidal properties.   Imaging guidance  Type of Imaging Technique: Fluoroscopy Guidance (Spinal) Indication(s): Assistance in needle guidance and placement for procedures requiring needle placement in or near specific anatomical locations not easily accessible without such assistance. Exposure Time: Please see nurses notes for exact fluoroscopy time. Contrast: Before injecting any contrast, we confirmed that the patient did not have an allergy to iodine, shellfish, or radiological contrast. Once satisfactory needle placement was completed, radiological contrast was injected under continuous fluoroscopic guidance. Injection of contrast accomplished without complications. See chart for type and volume of contrast used. Fluoroscopic Guidance: I was personally present in the fluoroscopy suite, where the patient was placed in position for the procedure, over the fluoroscopy-compatible table. Fluoroscopy was manipulated,  using "Tunnel Vision Technique", to obtain the best possible view of the target area, on the affected side. Parallax error was corrected before commencing the procedure. A "direction-depth-direction" technique was used to introduce the needle under continuous pulsed fluoroscopic guidance. Once the target was reached, antero-posterior, oblique, and lateral fluoroscopic projection views were taken to confirm needle placement in all planes. Electronic images uploaded into EMR.  Interpretation: Successful epidural injection.  Intraoperative imaging interpretation by performing Physician.    Post-op assessment  Post-procedure Vital Signs:  Pulse/HCG Rate: 97(!) 105 Temp: (!) 97 F (36.1 C) Resp: 17 BP: (!) 160/104 SpO2: 96 %  EBL: None  Complications: No immediate post-treatment complications observed by team, or reported by patient.  Note: The patient tolerated the entire procedure well. A repeat set of vitals were taken after the procedure and the patient was kept under observation following institutional policy, for this type of procedure. Post-procedural neurological assessment was performed, showing return to baseline, prior to discharge. The patient was provided with post-procedure discharge instructions, including a section on how to identify potential problems. Should any problems arise concerning this procedure, the patient was given instructions to immediately contact us, at any time, without hesitation. In any case, we plan to contact the patient by telephone for a follow-up status report regarding this interventional procedure.  Comments:  No additional relevant information.   Plan of care  Chronic Opioid Analgesic:  None MME/day: 0 mg/day   Medications administered: We administered iohexol, lidocaine, sodium chloride flush, ropivacaine (PF) 2 mg/mL (0.2%), and dexamethasone.  Follow-up plan:   Return in about 2 weeks (around 10/15/2021) for Proc-day (T,Th), (VV), (PPE).       Interventional Therapies  Risk  Complexity Considerations:   Estimated body mass index is 27.02 kg/m as calculated from the following:   Height as of this encounter: 5' 1"  (1.549 m).   Weight as of this encounter: 143 lb (64.9 kg). WNL   Planned  Pending:   Therapeutic cervical ESI #2    Under consideration:   Diagnostic bilateral suprascapular NB #1  Possible bilateral suprascapular nerve RFA  Diagnostic bilateral AC joint inj. #1  Diagnostic bilateral glenohumeral joint inj. #1  Diagnostic bilateral subacromial/subdeltoid bursa inj. #1  Diagnostic bilateral cervical facet MBB #1    Completed:   Diagnostic right cervical ESI x1 (06/27/2021) (100/100/100/70-90% for the neck/50% for the shoulders/0% for UEP) Diagnostic bilateral C4, C5, C6 cervical MBB x1 (02/25/2017) by Dr. Gillis Santa (100/100/0)    Completed by other providers:   Diagnostic bilateral C4, C5, C6 cervical MBB x1 (02/25/2017) by Dr. Gillis Santa (100/100/0)  Therapeutic bilateral subacromial shoulder bursa inj. x4 (03/18/2021) by Lattie Corns, PA  Diagnostic EMG/PNCV (upper extremity) (03/25/2021) by Dr. Jennings Books (Right mild (grade II) carpal tunnel syndrome (median nerve entrapment at wrist), &  Right ulnar neruopathy site unspecified.)  Diagnostic EMG/PNCV (upper extremity) (06/14/2018) by Dr. Gurney Maxin (WNL)  Diagnostic EMG/PNCV (lower extremity) (03/18/2021) by Dr. Jennings Books (WNL)  Diagnostic EMG/PNCV (lower extremity) (12/06/2019) by Dr. Jennings Books (sensorimotor peripheral neuropathy + right radiculopathy)  Diagnostic EMG/PNCV (lower extremity) (11/30/2013) by Dr. Gurney Maxin (mild left lower lumbosacral radiculopathy)    Therapeutic  Palliative (PRN) options:   None established       Recent Visits Date Type Provider Dept  08/20/21 Office Visit Milinda Pointer, MD [facility]  07/30/21 Procedure visit Milinda Pointer, MD [facility]  07/24/21 Office Visit  Milinda Pointer, MD [facility]  Showing recent visits within past 90 days and meeting all other requirements Today's Visits Date Type Provider Dept  10/01/21 Procedure visit Milinda Pointer, MD [facility]  Showing today's visits and meeting all other requirements Future Appointments Date Type Provider Dept  10/17/21 Appointment Milinda Pointer, MD [facility]  Showing future appointments within next 90 days and meeting all other requirements   Disposition: Discharge home  Discharge (Date  Time): 10/01/2021; 1115 hrs.   Primary Care Physician: Sharyne Peach, MD Location: [facility] Note by: Gaspar Cola, MD Date: 10/01/2021; Time: 4:40 PM  DISCLAIMER: Medicine is not an Chief Strategy Officer. It has no guarantees or warranties. The decision to proceed with this intervention was based on the information collected from the patient. Conclusions were drawn from the patient's questionnaire, interview, and examination. Because information was provided in large part by the patient, it cannot be guaranteed that it has not been purposely or unconsciously manipulated or altered. Every effort has been made to obtain as much accurate, relevant, available data as possible. Always take into account that the treatment will also be dependent on availability of resources and existing treatment guidelines, considered by other Pain Management Specialists as being common knowledge and practice, at the time of the intervention. It is also important to point out that variation in procedural techniques and pharmacological choices are the acceptable norm. For Medico-Legal review purposes, the indications, contraindications, technique, and results of the these procedures should only be evaluated, judged and interpreted by a Board-Certified Interventional Pain Specialist with extensive familiarity and expertise in the same exact procedure and  technique.

## 2021-10-01 NOTE — Patient Instructions (Signed)

## 2021-10-02 ENCOUNTER — Telehealth: Payer: Self-pay

## 2021-10-02 NOTE — Telephone Encounter (Signed)
Post procedure phone call. Patient states she is doing good.  

## 2021-10-14 NOTE — Progress Notes (Signed)
Unsuccessful attempt to contact patient for Virtual Visit (Pain Management Telehealth)   Patient provided contact information:  [phone] (home); [phone] (mobile); (Preferred) [phone] [email or web address]   Pre-screening:  Our staff was unsuccessful in contacting Caroline Hughes using the above provided information.   I unsuccessfully attempted to make contact with Caroline Hughes on 10/17/2021 via telephone. I was unable to complete the virtual encounter due to call going directly to voicemail. I was able to leave a message where I clearly identify myself as Gaspar Cola, MD and I left a message to call us back to reschedule the call.  Pharmacotherapy Assessment  Analgesic: None MME/day: 0 mg/day   Follow-up plan:   Reschedule Visit.    Interventional Therapies  Risk  Complexity Considerations:   Estimated body mass index is 27.02 kg/m as calculated from the following:   Height as of this encounter: 5' 1"  (1.549 m).   Weight as of this encounter: 143 lb (64.9 kg). WNL   Planned  Pending:   Therapeutic cervical ESI #2    Under consideration:   Diagnostic bilateral suprascapular NB #1  Possible bilateral suprascapular nerve RFA  Diagnostic bilateral AC joint inj. #1  Diagnostic bilateral glenohumeral joint inj. #1  Diagnostic bilateral subacromial/subdeltoid bursa inj. #1  Diagnostic bilateral cervical facet MBB #1    Completed:   Diagnostic right cervical ESI x1 (06/27/2021) (100/100/100/70-90% for the neck/50% for the shoulders/0% for UEP) Diagnostic bilateral C4, C5, C6 cervical MBB x1 (02/25/2017) by Dr. Gillis Santa (100/100/0)    Completed by other providers:   Diagnostic bilateral C4, C5, C6 cervical MBB x1 (02/25/2017) by Dr. Gillis Santa (100/100/0)  Therapeutic bilateral subacromial shoulder bursa inj. x4 (03/18/2021) by Lattie Corns, PA  Diagnostic EMG/PNCV (upper extremity) (03/25/2021) by Dr. Jennings Books (Right mild (grade II) carpal  tunnel syndrome (median nerve entrapment at wrist), &  Right ulnar neruopathy site unspecified.)  Diagnostic EMG/PNCV (upper extremity) (06/14/2018) by Dr. Gurney Maxin (WNL)  Diagnostic EMG/PNCV (lower extremity) (03/18/2021) by Dr. Jennings Books (WNL)  Diagnostic EMG/PNCV (lower extremity) (12/06/2019) by Dr. Jennings Books (sensorimotor peripheral neuropathy + right radiculopathy)  Diagnostic EMG/PNCV (lower extremity) (11/30/2013) by Dr. Gurney Maxin (mild left lower lumbosacral radiculopathy)    Therapeutic  Palliative (PRN) options:   None established        Recent Visits Date Type Provider Dept  10/01/21 Procedure visit Milinda Pointer, MD [facility]  08/20/21 Office Visit Milinda Pointer, MD [facility]  07/30/21 Procedure visit Milinda Pointer, MD [facility]  07/24/21 Office Visit Milinda Pointer, MD [facility]  Showing recent visits within past 90 days and meeting all other requirements Today's Visits Date Type Provider Dept  10/17/21 Office Visit Milinda Pointer, MD [facility]  Showing today's visits and meeting all other requirements Future Appointments No visits were found meeting these conditions. Showing future appointments within next 90 days and meeting all other requirements   Note by: Gaspar Cola, MD Date: 10/17/2021; Time: 4:33 PM

## 2021-10-16 ENCOUNTER — Telehealth: Payer: Self-pay

## 2021-10-16 NOTE — Telephone Encounter (Signed)
LM to call us back for pre virtual appointment questions.

## 2021-10-17 ENCOUNTER — Ambulatory Visit: Payer: Medicare Other | Attending: Pain Medicine | Admitting: Pain Medicine

## 2021-10-17 DIAGNOSIS — G8929 Other chronic pain: Secondary | ICD-10-CM

## 2021-10-17 DIAGNOSIS — R202 Paresthesia of skin: Secondary | ICD-10-CM

## 2021-10-17 DIAGNOSIS — M5412 Radiculopathy, cervical region: Secondary | ICD-10-CM

## 2021-10-17 DIAGNOSIS — R200 Anesthesia of skin: Secondary | ICD-10-CM

## 2021-10-17 DIAGNOSIS — M542 Cervicalgia: Secondary | ICD-10-CM

## 2021-10-17 DIAGNOSIS — M4312 Spondylolisthesis, cervical region: Secondary | ICD-10-CM

## 2021-10-17 DIAGNOSIS — M4310 Spondylolisthesis, site unspecified: Secondary | ICD-10-CM

## 2022-05-15 ENCOUNTER — Encounter: Payer: Self-pay | Admitting: Urology

## 2022-05-15 ENCOUNTER — Ambulatory Visit (INDEPENDENT_AMBULATORY_CARE_PROVIDER_SITE_OTHER): Payer: Medicare Other | Admitting: Urology

## 2022-05-15 VITALS — BP 144/70 | HR 105 | Ht 63.0 in | Wt 143.0 lb

## 2022-05-15 DIAGNOSIS — R102 Pelvic and perineal pain: Secondary | ICD-10-CM

## 2022-05-15 LAB — URINALYSIS, COMPLETE
Bilirubin, UA: NEGATIVE
Glucose, UA: NEGATIVE
Ketones, UA: NEGATIVE
Nitrite, UA: NEGATIVE
Protein,UA: NEGATIVE
RBC, UA: NEGATIVE
Specific Gravity, UA: 1.02 (ref 1.005–1.030)
Urobilinogen, Ur: 0.2 mg/dL (ref 0.2–1.0)
pH, UA: 5 (ref 5.0–7.5)

## 2022-05-15 LAB — MICROSCOPIC EXAMINATION

## 2022-05-15 LAB — BLADDER SCAN AMB NON-IMAGING: Scan Result: 0

## 2022-05-15 NOTE — Progress Notes (Signed)
05/15/2022 9:21 AM   Caroline Hughes Apr 15, 1961 [id number]  Referring provider: Sharyne Peach, MD [place],  [place] [address]  Chief Complaint  Patient presents with   Urinary Retention    HPI: Caroline Hughes is a 62 y.o. female referred for urinary retention  Patient seen 2021 for recurrent UTI and unremarkable renal ultrasound Denies any recurrent UTI symptoms or voiding problems and states she is not sure why she was referred to urology The reason for the consultation states urinary retention though I do not see any notes on record review of why she was referred She states her primary problem has been pelvic pain.  She has undergone pelvic floor PT which has not been effective No bothersome LUTS Denies dysuria, gross hematuria   PMH: Past Medical History:  Diagnosis Date   Anemia    Iron deficiency IRON INFUSIONS X 5 WEEKS   Anxiety    Arthritis    Asthma    Bronchitis 5284   Complication of anesthesia    Crohn's disease ([place])    Depression    Diabetes mellitus without complication (HCC)    Endometriosis    Fibromyalgia    Gastric ulcer    GERD (gastroesophageal reflux disease)    Herniated disc, cervical    Hyperlipemia    Intestinal obstruction (HCC)    Migraine    CHRONIC   Nausea    CHRONIC   Neuropathy    Pain    BACK   Peristomal hernia    PONV (postoperative nausea and vomiting)    Ulcerative colitis ([place])     Surgical History: Past Surgical History:  Procedure Laterality Date   ABDOMINAL HYSTERECTOMY     CATARACT EXTRACTION W/PHACO Right 11/25/2016   Procedure: CATARACT EXTRACTION PHACO AND INTRAOCULAR LENS PLACEMENT ([place]);  Surgeon: Birder Robson, MD;  Location: [facility] ORS;  Service: Ophthalmology;  Laterality: Right;  [place] 00:40 AP% 17.4 CDE 6.94 Fluid pack lot # [id number]   CATARACT EXTRACTION W/PHACO Left 12/02/2016   Procedure: CATARACT EXTRACTION PHACO AND INTRAOCULAR LENS PLACEMENT ([facility]);  Surgeon: Birder Robson, MD;  Location: [facility] OR[facility];  Service: Ophthalmology;  Laterality: Left;  [place] 00:34 AP% 14.2 CDE 4.94 Flyuid pack lot 3 [id number]   CHOLECYSTECTOMY     COLON RESECTION     ESOPHAGOGASTRODUODENOSCOPY (EGD) WITH PROPOFOL N/A 06/17/2017   Procedure: ESOPHAGOGASTRODUODENOSCOPY (EGD) WITH PROPOFOL;  Surgeon: [name], MD;  Location[facility];  Service: Gastroenterology;  Laterality: N/A;   ESOPHAGOGASTRODUODENOSCOPY (EGD) WITH PROPOFOL N/A 06/19/2017   Procedure: ESOPHAGOGASTRODUODENOSCOPY (EGD) WITH PROPOFOL;  Surgeon: [name], MD;  Location: [facility] ENDOSCOPY;  Service: Gastroenterology;  Laterality: N/A;   ESOPHAGOGASTRODUODENOSCOPY (EGD) WITH PROPOFOL N/A 01/26/2019   Procedure: ESOPHAGOGASTRODUODENOSCOPY (EGD) WITH PROPOFOL;  Surgeon: [place], Benay Pike, MD;  Location: [facility] ENDOSCOPY;  Service: Gastroenterology;  Laterality: N/A;   HERNIA REPAIR     MULTIPLE PERISTOMAL   ILEOSCOPY N/A 06/17/2017   Procedure: ILEOSCOPY THROUGH STOMA;  Surgeon: [name], MD;  Location: [facility] ENDOSCOPY;  Service: Gastroenterology;  Laterality: N/A;   ILEOSCOPY N/A 06/23/2017   Procedure: ILEOSCOPY THROUGH STOMA;  Surgeon: Lin Landsman, MD;  Location: [facility] END[facility]COPY;  Service: Gastroenterology;  Laterality: N/A;   ILEOSTOMY Bilateral    MOVED X 3   rectal fistula packing     X 3    Home Medications:  Allergies as of 05/15/2022       Reactions   Naproxen Other (See Comments)   Other Reaction: GI  bleed   Penicillins Swelling, Rash, Other (See Comments)   Reaction: fever Has patient had a PCN reaction causing immediate rash, facial/tongue/throat swelling, SOB or lightheadedness with hypotension: Yes Has patient had a PCN reaction causing severe rash involving mucus membranes or skin necrosis: No Has patient had a PCN reaction that required hospitalization: No Has patient had a PCN reaction occurring within the last 10 years: No If all of the above answers are "NO", then may proceed with  Cephalosporin use.   Gluten Meal    Bloating, gas, pain   Lactose Intolerance (gi)    Bloating, gas, pain   Latex Itching   redness   Soy Allergy Other (See Comments)   Reaction: digestive issues.   Doxycycline Rash   Humira [adalimumab] Rash, Other (See Comments)   Reaction: fever.   Other Diarrhea, Nausea And Vomiting, Other (See Comments)   "DIGESTIVE ISSUES" Nuts "DIGESTIVE PROBLEMS"   Simvastatin Rash   Statins Rash   Sulfa Antibiotics Rash   Vancomycin Rash, Other (See Comments)   Reaction: fever        Medication List        Accurate as of May 15, 2022  9:21 AM. If you have any questions, ask your nurse or doctor.          Advair HFA 230-21 MCG/ACT inhaler Generic drug: fluticasone-salmeterol Inhale 2 puffs into the lungs 2 (two) times daily.   Ajovy 225 MG/1.5ML Sosy Generic drug: Fremanezumab-vfrm Inject into the skin.   Ajovy 225 MG/1.5ML Sosy Generic drug: Fremanezumab-vfrm Inject into the muscle.   albuterol 108 (90 Base) MCG/ACT inhaler Commonly known as: VENTOLIN HFA Inhale 2 puffs into the lungs every 6 (six) hours as needed for wheezing or shortness of breath.   albuterol (2.5 MG/3ML) 0.083% nebulizer solution Commonly known as: PROVENTIL Inhale 3 mLs into the lungs every 6 (six) hours as needed for shortness of breath.   ALPRAZolam 1 MG tablet Commonly known as: XANAX Take by mouth.   baclofen 10 MG tablet Commonly known as: LIORESAL Take 10 mg by mouth 2 (two) times daily.   Cimzia Prefilled 2 X 200 MG/ML Kit Generic drug: Certolizumab Pegol   Cimzia Prefilled 2 X 200 MG/ML Pskt prefilled syringe Generic drug: certolizumab pegol INJECT 400MG  (=2 INJECTIONS) SUBCUTANEOUSLY EVERY 28 DAYS AS DIRECTED.   clindamycin 300 MG capsule Commonly known as: CLEOCIN Take 300 mg by mouth every 6 (six) hours.   desipramine 25 MG tablet Commonly known as: NORPRAMIN   dicyclomine 10 MG capsule Commonly known as: BENTYL Take 10 mg by  mouth every morning.   escitalopram 20 MG tablet Commonly known as: LEXAPRO Take by mouth.   famotidine 20 MG tablet Commonly known as: PEPCID Take 20 mg by mouth 2 (two) times daily.   fexofenadine 180 MG tablet Commonly known as: ALLEGRA Take by mouth.   gabapentin 600 MG tablet Commonly known as: NEURONTIN Take 600 mg by mouth 3 (three) times daily.   hydrOXYzine 25 MG tablet Commonly known as: ATARAX Take 1 tablet by mouth as needed.   insulin detemir 100 UNIT/ML injection Commonly known as: LEVEMIR Inject 0.14 mLs (14 Units total) into the skin at bedtime.   levofloxacin 500 MG tablet Commonly known as: LEVAQUIN Take 500 mg by mouth daily.   methotrexate 2.5 MG tablet Commonly known as: RHEUMATREX Take by mouth.   montelukast 10 MG tablet Commonly known as: SINGULAIR Take 10 mg by mouth at bedtime.   nortriptyline 10 MG capsule  Commonly known as: PAMELOR Take 10 mg by mouth 2 (two) times daily.   ondansetron 8 MG disintegrating tablet Commonly known as: ZOFRAN-ODT Take 8 mg by mouth 2 (two) times daily as needed for nausea/vomiting.   pantoprazole 40 MG tablet Commonly known as: PROTONIX Take 1 tablet (40 mg total) by mouth 2 (two) times daily before a meal.   Precision QID Test test strip Generic drug: glucose blood Use 3 (three) times daily e11.65   rosuvastatin 5 MG tablet Commonly known as: CRESTOR Take by mouth.   Semaglutide(0.25 or 0.5MG /DOS) 2 MG/1.5ML Sopn Inject into the skin.   sucralfate 1 GM/10ML suspension Commonly known as: CARAFATE Take 1 g by mouth 4 (four) times daily -  with meals and at bedtime.   SUMAtriptan 100 MG tablet Commonly known as: IMITREX Take 100 mg by mouth as directed.   tiZANidine 4 MG tablet Commonly known as: ZANAFLEX Take by mouth.   UltiCare Micro Pen Needles 32G X 4 MM Misc Generic drug: Insulin Pen Needle   UltiGuard SafePack Pen Needle 32G X 4 MM Misc Generic drug: Insulin Pen Needle Inject  into the skin.        Allergies:  Allergies  Allergen Reactions   Naproxen Other (See Comments)    Other Reaction: GI bleed   Penicillins Swelling, Rash and Other (See Comments)    Reaction: fever Has patient had a PCN reaction causing immediate rash, facial/tongue/throat swelling, SOB or lightheadedness with hypotension: Yes Has patient had a PCN reaction causing severe rash involving mucus membranes or skin necrosis: No Has patient had a PCN reaction that required hospitalization: No Has patient had a PCN reaction occurring within the last 10 years: No If all of the above answers are "NO", then may proceed with Cephalosporin use.    Gluten Meal     Bloating, gas, pain   Lactose Intolerance (Gi)     Bloating, gas, pain   Latex Itching    redness   Soy Allergy Other (See Comments)    Reaction: digestive issues.   Doxycycline Rash   Humira [Adalimumab] Rash and Other (See Comments)    Reaction: fever.   Other Diarrhea, Nausea And Vomiting and Other (See Comments)    "DIGESTIVE ISSUES" Nuts "DIGESTIVE PROBLEMS"   Simvastatin Rash   Statins Rash   Sulfa Antibiotics Rash   Vancomycin Rash and Other (See Comments)    Reaction: fever    Family History: Family History  Problem Relation Age of Onset   Hypertension Father    Hyperlipidemia Father    Heart attack Father     Social History:  reports that she has quit smoking. She has never used smokeless tobacco. She reports current alcohol use of about 3.0 standard drinks of alcohol per week. She reports that she does not use drugs.   Physical Exam: BP (!) 144/70   Pulse (!) 105   Ht 5\' 3"  (1.6 m)   Wt 143 lb (64.9 kg)   BMI 25.33 kg/m   Constitutional:  Alert and oriented, No acute distress. HEENT: [place] AT Respiratory: Normal respiratory effort, no increased work of breathing. Psychiatric: Normal mood and affect.  Laboratory Data:  Urinalysis Dipstick trace leukocytes Microscopy 6-10 WBC    Assessment &  Plan:    1.  Pelvic pain She has no bothersome voiding symptoms PVR was 0 mL Recommend evaluation by gynecology/MIGS at [facility].    Abbie Sons, [place] [address], [place]  [place], [place] [address] [phone]

## 2022-05-16 ENCOUNTER — Encounter: Payer: Self-pay | Admitting: Urology

## 2022-05-17 LAB — CULTURE, URINE COMPREHENSIVE

## 2022-05-19 ENCOUNTER — Encounter: Payer: Self-pay | Admitting: Family Medicine

## 2023-01-02 ENCOUNTER — Other Ambulatory Visit: Payer: Self-pay | Admitting: Gastroenterology

## 2023-01-02 ENCOUNTER — Encounter: Payer: Self-pay | Admitting: Hematology and Oncology

## 2023-01-02 DIAGNOSIS — R7401 Elevation of levels of liver transaminase levels: Secondary | ICD-10-CM

## 2023-01-06 ENCOUNTER — Ambulatory Visit
Admission: RE | Admit: 2023-01-06 | Discharge: 2023-01-06 | Disposition: A | Discharge status: Home / Self Care | Payer: Medicare Other | Source: Ambulatory Visit | Attending: Gastroenterology | Admitting: Gastroenterology

## 2023-01-06 DIAGNOSIS — R7401 Elevation of levels of liver transaminase levels: Secondary | ICD-10-CM | POA: Insufficient documentation

## 2023-04-03 ENCOUNTER — Other Ambulatory Visit: Payer: Self-pay | Admitting: Nurse Practitioner

## 2023-04-03 DIAGNOSIS — E1149 Type 2 diabetes mellitus with other diabetic neurological complication: Secondary | ICD-10-CM

## 2023-04-03 DIAGNOSIS — R110 Nausea: Secondary | ICD-10-CM

## 2023-04-03 DIAGNOSIS — I10 Essential (primary) hypertension: Secondary | ICD-10-CM

## 2023-04-03 DIAGNOSIS — E782 Mixed hyperlipidemia: Secondary | ICD-10-CM

## 2023-04-03 DIAGNOSIS — R9431 Abnormal electrocardiogram [ECG] [EKG]: Secondary | ICD-10-CM

## 2023-04-06 ENCOUNTER — Ambulatory Visit
Admission: RE | Admit: 2023-04-06 | Discharge: 2023-04-06 | Disposition: A | Discharge status: Home / Self Care | Payer: Self-pay | Source: Ambulatory Visit | Attending: Nurse Practitioner | Admitting: Nurse Practitioner

## 2023-04-06 DIAGNOSIS — I10 Essential (primary) hypertension: Secondary | ICD-10-CM | POA: Insufficient documentation

## 2023-04-06 DIAGNOSIS — E1149 Type 2 diabetes mellitus with other diabetic neurological complication: Secondary | ICD-10-CM | POA: Insufficient documentation

## 2023-04-06 DIAGNOSIS — R110 Nausea: Secondary | ICD-10-CM | POA: Insufficient documentation

## 2023-04-06 DIAGNOSIS — R9431 Abnormal electrocardiogram [ECG] [EKG]: Secondary | ICD-10-CM | POA: Insufficient documentation

## 2023-04-06 DIAGNOSIS — E782 Mixed hyperlipidemia: Secondary | ICD-10-CM | POA: Insufficient documentation

## 2023-10-30 ENCOUNTER — Other Ambulatory Visit: Payer: Self-pay | Admitting: Gastroenterology

## 2023-10-30 DIAGNOSIS — K5000 Crohn's disease of small intestine without complications: Secondary | ICD-10-CM

## 2023-11-06 ENCOUNTER — Ambulatory Visit
Admission: RE | Admit: 2023-11-06 | Discharge: 2023-11-06 | Disposition: A | Discharge status: Home / Self Care | Source: Ambulatory Visit | Attending: Gastroenterology | Admitting: Gastroenterology

## 2023-11-06 DIAGNOSIS — K5000 Crohn's disease of small intestine without complications: Secondary | ICD-10-CM | POA: Diagnosis present

## 2023-11-06 LAB — POCT I-STAT CREATININE: Creatinine, Ser: 0.9 mg/dL (ref 0.44–1.00)

## 2023-11-06 MED ORDER — IOHEXOL 300 MG/ML  SOLN
100.0000 mL | Freq: Once | INTRAMUSCULAR | Status: AC | PRN
  Administered 2023-11-06: 100 mL via INTRAVENOUS

## 2023-12-18 ENCOUNTER — Encounter: Admission: RE | Payer: Self-pay | Source: Home / Self Care

## 2023-12-18 ENCOUNTER — Ambulatory Visit: Admission: RE | Admit: 2023-12-18 | Source: Home / Self Care | Admitting: Gastroenterology

## 2023-12-18 SURGERY — ILEOSCOPY, THROUGH STOMA
Anesthesia: General
